# Patient Record
Sex: Female | Born: 1945 | Race: Black or African American | Hispanic: No | Marital: Married | Smoking: Never smoker
Health system: Southern US, Community
[De-identification: ages and names within clinical notes are randomized; demographics above are authoritative.]

## PROBLEM LIST (undated history)

## (undated) DIAGNOSIS — C50919 Malignant neoplasm of unspecified site of unspecified female breast: Secondary | ICD-10-CM

## (undated) DIAGNOSIS — I10 Essential (primary) hypertension: Secondary | ICD-10-CM

## (undated) DIAGNOSIS — M1990 Unspecified osteoarthritis, unspecified site: Secondary | ICD-10-CM

## (undated) DIAGNOSIS — J811 Chronic pulmonary edema: Secondary | ICD-10-CM

## (undated) DIAGNOSIS — I5189 Other ill-defined heart diseases: Secondary | ICD-10-CM

## (undated) DIAGNOSIS — C801 Malignant (primary) neoplasm, unspecified: Secondary | ICD-10-CM

## (undated) DIAGNOSIS — E7800 Pure hypercholesterolemia, unspecified: Secondary | ICD-10-CM

## (undated) DIAGNOSIS — B029 Zoster without complications: Secondary | ICD-10-CM

## (undated) HISTORY — DX: Malignant neoplasm of unspecified site of unspecified female breast: C50.919

## (undated) HISTORY — PX: CHOLECYSTECTOMY: SHX55

## (undated) HISTORY — PX: BREAST SURGERY: SHX581

## (undated) HISTORY — PX: ABDOMINAL HYSTERECTOMY: SHX81

---

## 1999-08-15 ENCOUNTER — Other Ambulatory Visit: Admission: RE | Admit: 1999-08-15 | Discharge: 1999-08-15 | Payer: Self-pay | Admitting: Obstetrics and Gynecology

## 2000-10-31 ENCOUNTER — Other Ambulatory Visit: Admission: RE | Admit: 2000-10-31 | Discharge: 2000-10-31 | Payer: Self-pay | Admitting: Obstetrics and Gynecology

## 2001-08-21 ENCOUNTER — Ambulatory Visit (HOSPITAL_COMMUNITY): Admission: RE | Admit: 2001-08-21 | Discharge: 2001-08-21 | Payer: Self-pay | Admitting: Internal Medicine

## 2002-03-15 ENCOUNTER — Encounter: Payer: Self-pay | Admitting: Internal Medicine

## 2002-03-15 ENCOUNTER — Emergency Department (HOSPITAL_COMMUNITY): Admission: EM | Admit: 2002-03-15 | Discharge: 2002-03-15 | Payer: Self-pay | Admitting: Internal Medicine

## 2003-09-22 ENCOUNTER — Emergency Department (HOSPITAL_COMMUNITY): Admission: EM | Admit: 2003-09-22 | Discharge: 2003-09-22 | Payer: Self-pay | Admitting: Emergency Medicine

## 2003-11-16 ENCOUNTER — Ambulatory Visit (HOSPITAL_COMMUNITY): Admission: RE | Admit: 2003-11-16 | Discharge: 2003-11-16 | Payer: Self-pay | Admitting: Internal Medicine

## 2004-10-06 ENCOUNTER — Ambulatory Visit (HOSPITAL_COMMUNITY): Admission: RE | Admit: 2004-10-06 | Discharge: 2004-10-06 | Payer: Self-pay | Admitting: Internal Medicine

## 2005-08-31 ENCOUNTER — Ambulatory Visit (HOSPITAL_COMMUNITY): Admission: RE | Admit: 2005-08-31 | Discharge: 2005-08-31 | Payer: Self-pay | Admitting: Internal Medicine

## 2005-12-04 ENCOUNTER — Ambulatory Visit (HOSPITAL_COMMUNITY): Admission: RE | Admit: 2005-12-04 | Discharge: 2005-12-04 | Payer: Self-pay | Admitting: Internal Medicine

## 2006-01-24 ENCOUNTER — Encounter: Admission: RE | Admit: 2006-01-24 | Discharge: 2006-01-24 | Payer: Self-pay | Admitting: Radiology

## 2006-03-07 ENCOUNTER — Encounter: Admission: RE | Admit: 2006-03-07 | Discharge: 2006-03-07 | Payer: Self-pay | Admitting: General Surgery

## 2006-03-11 ENCOUNTER — Ambulatory Visit (HOSPITAL_BASED_OUTPATIENT_CLINIC_OR_DEPARTMENT_OTHER): Admission: RE | Admit: 2006-03-11 | Discharge: 2006-03-11 | Payer: Self-pay | Admitting: General Surgery

## 2006-03-11 ENCOUNTER — Encounter (INDEPENDENT_AMBULATORY_CARE_PROVIDER_SITE_OTHER): Payer: Self-pay | Admitting: *Deleted

## 2006-03-27 ENCOUNTER — Ambulatory Visit: Payer: Self-pay | Admitting: Oncology

## 2006-04-03 LAB — CBC WITH DIFFERENTIAL/PLATELET
Basophils Absolute: 0 10*3/uL (ref 0.0–0.1)
Eosinophils Absolute: 0.4 10*3/uL (ref 0.0–0.5)
HCT: 37.3 % (ref 34.8–46.6)
HGB: 12.6 g/dL (ref 11.6–15.9)
LYMPH%: 22 % (ref 14.0–48.0)
MONO#: 0.6 10*3/uL (ref 0.1–0.9)
NEUT#: 6.2 10*3/uL (ref 1.5–6.5)
Platelets: 352 10*3/uL (ref 145–400)
RBC: 4.34 10*6/uL (ref 3.70–5.32)
WBC: 9.2 10*3/uL (ref 3.9–10.0)

## 2006-04-03 LAB — COMPREHENSIVE METABOLIC PANEL
Albumin: 4.2 g/dL (ref 3.5–5.2)
CO2: 24 mEq/L (ref 19–32)
Glucose, Bld: 244 mg/dL — ABNORMAL HIGH (ref 70–99)
Potassium: 4 mEq/L (ref 3.5–5.3)
Sodium: 139 mEq/L (ref 135–145)
Total Bilirubin: 0.4 mg/dL (ref 0.3–1.2)
Total Protein: 7.9 g/dL (ref 6.0–8.3)

## 2006-04-03 LAB — LACTATE DEHYDROGENASE: LDH: 162 U/L (ref 94–250)

## 2006-04-03 LAB — CANCER ANTIGEN 27.29: CA 27.29: 28 U/mL (ref 0–39)

## 2006-04-18 ENCOUNTER — Ambulatory Visit (HOSPITAL_COMMUNITY): Admission: RE | Admit: 2006-04-18 | Discharge: 2006-04-18 | Payer: Self-pay | Admitting: Oncology

## 2006-04-22 LAB — CBC WITH DIFFERENTIAL/PLATELET
Basophils Absolute: 0 10*3/uL (ref 0.0–0.1)
Eosinophils Absolute: 0.3 10*3/uL (ref 0.0–0.5)
HCT: 38 % (ref 34.8–46.6)
HGB: 12.6 g/dL (ref 11.6–15.9)
LYMPH%: 24.1 % (ref 14.0–48.0)
MCV: 86.9 fL (ref 81.0–101.0)
MONO%: 7.4 % (ref 0.0–13.0)
NEUT#: 6.4 10*3/uL (ref 1.5–6.5)
NEUT%: 65.5 % (ref 39.6–76.8)
Platelets: 292 10*3/uL (ref 145–400)

## 2006-04-26 LAB — WHOLE BLOOD GLUCOSE: Glucose: 181 mg/dL — ABNORMAL HIGH (ref 70–100)

## 2006-04-26 LAB — CBC WITH DIFFERENTIAL/PLATELET
Eosinophils Absolute: 0 10*3/uL (ref 0.0–0.5)
LYMPH%: 9.7 % — ABNORMAL LOW (ref 14.0–48.0)
MCH: 28.4 pg (ref 26.0–34.0)
MCHC: 33.3 g/dL (ref 32.0–36.0)
MCV: 85.2 fL (ref 81.0–101.0)
MONO%: 6.7 % (ref 0.0–13.0)
NEUT#: 14.4 10*3/uL — ABNORMAL HIGH (ref 1.5–6.5)
Platelets: 352 10*3/uL (ref 145–400)
RBC: 4.5 10*6/uL (ref 3.70–5.32)

## 2006-04-29 ENCOUNTER — Ambulatory Visit (HOSPITAL_COMMUNITY): Admission: RE | Admit: 2006-04-29 | Discharge: 2006-04-29 | Payer: Self-pay | Admitting: Oncology

## 2006-05-02 LAB — CBC WITH DIFFERENTIAL/PLATELET
Basophils Absolute: 0.5 10*3/uL — ABNORMAL HIGH (ref 0.0–0.1)
EOS%: 0.6 % (ref 0.0–7.0)
HGB: 13.3 g/dL (ref 11.6–15.9)
MCH: 28.9 pg (ref 26.0–34.0)
MCV: 85.9 fL (ref 81.0–101.0)
MONO%: 8.6 % (ref 0.0–13.0)
NEUT%: 72.5 % (ref 39.6–76.8)
RDW: 12.6 % (ref 11.3–14.5)

## 2006-05-14 ENCOUNTER — Encounter: Admission: RE | Admit: 2006-05-14 | Discharge: 2006-05-14 | Payer: Self-pay | Admitting: Oncology

## 2006-05-14 ENCOUNTER — Ambulatory Visit: Payer: Self-pay | Admitting: Oncology

## 2006-05-14 ENCOUNTER — Encounter (INDEPENDENT_AMBULATORY_CARE_PROVIDER_SITE_OTHER): Payer: Self-pay | Admitting: *Deleted

## 2006-05-14 ENCOUNTER — Other Ambulatory Visit: Admission: RE | Admit: 2006-05-14 | Discharge: 2006-05-14 | Payer: Self-pay | Admitting: Interventional Radiology

## 2006-05-16 LAB — CBC WITH DIFFERENTIAL/PLATELET
Eosinophils Absolute: 0 10*3/uL (ref 0.0–0.5)
MCV: 86.1 fL (ref 81.0–101.0)
MONO%: 2.6 % (ref 0.0–13.0)
NEUT#: 19 10*3/uL — ABNORMAL HIGH (ref 1.5–6.5)
RBC: 4.12 10*6/uL (ref 3.70–5.32)
RDW: 13.3 % (ref 11.3–14.5)
WBC: 20.7 10*3/uL — ABNORMAL HIGH (ref 3.9–10.0)

## 2006-05-16 LAB — COMPREHENSIVE METABOLIC PANEL
Albumin: 4.2 g/dL (ref 3.5–5.2)
CO2: 18 mEq/L — ABNORMAL LOW (ref 19–32)
Chloride: 104 mEq/L (ref 96–112)
Glucose, Bld: 354 mg/dL — ABNORMAL HIGH (ref 70–99)
Potassium: 3.8 mEq/L (ref 3.5–5.3)
Sodium: 136 mEq/L (ref 135–145)
Total Protein: 7.3 g/dL (ref 6.0–8.3)

## 2006-05-23 LAB — CBC WITH DIFFERENTIAL/PLATELET
Eosinophils Absolute: 0 10*3/uL (ref 0.0–0.5)
LYMPH%: 38.9 % (ref 14.0–48.0)
MONO#: 0.2 10*3/uL (ref 0.1–0.9)
NEUT#: 1.8 10*3/uL (ref 1.5–6.5)
Platelets: 383 10*3/uL (ref 145–400)
RBC: 3.69 10*6/uL — ABNORMAL LOW (ref 3.70–5.32)
RDW: 15.2 % — ABNORMAL HIGH (ref 11.3–14.5)
WBC: 3.4 10*3/uL — ABNORMAL LOW (ref 3.9–10.0)
lymph#: 1.3 10*3/uL (ref 0.9–3.3)

## 2006-05-23 LAB — COMPREHENSIVE METABOLIC PANEL
ALT: 40 U/L (ref 0–40)
Albumin: 4 g/dL (ref 3.5–5.2)
CO2: 24 mEq/L (ref 19–32)
Calcium: 9.5 mg/dL (ref 8.4–10.5)
Chloride: 104 mEq/L (ref 96–112)
Glucose, Bld: 230 mg/dL — ABNORMAL HIGH (ref 70–99)
Potassium: 3.9 mEq/L (ref 3.5–5.3)
Sodium: 138 mEq/L (ref 135–145)
Total Protein: 6.4 g/dL (ref 6.0–8.3)

## 2006-06-06 LAB — CBC WITH DIFFERENTIAL/PLATELET
BASO%: 0.2 % (ref 0.0–2.0)
LYMPH%: 6.2 % — ABNORMAL LOW (ref 14.0–48.0)
MCHC: 34.2 g/dL (ref 32.0–36.0)
MONO#: 1.1 10*3/uL — ABNORMAL HIGH (ref 0.1–0.9)
NEUT#: 23.6 10*3/uL — ABNORMAL HIGH (ref 1.5–6.5)
Platelets: 358 10*3/uL (ref 145–400)
RBC: 4.13 10*6/uL (ref 3.70–5.32)
RDW: 13.7 % (ref 11.3–14.5)
WBC: 26.4 10*3/uL — ABNORMAL HIGH (ref 3.9–10.0)
lymph#: 1.6 10*3/uL (ref 0.9–3.3)

## 2006-06-13 LAB — CBC WITH DIFFERENTIAL/PLATELET
BASO%: 0.4 % (ref 0.0–2.0)
LYMPH%: 37.7 % (ref 14.0–48.0)
MCHC: 33.9 g/dL (ref 32.0–36.0)
MCV: 84.5 fL (ref 81.0–101.0)
MONO#: 0.2 10*3/uL (ref 0.1–0.9)
MONO%: 6.9 % (ref 0.0–13.0)
Platelets: 345 10*3/uL (ref 145–400)
RBC: 3.95 10*6/uL (ref 3.70–5.32)
WBC: 3.1 10*3/uL — ABNORMAL LOW (ref 3.9–10.0)

## 2006-06-13 LAB — COMPREHENSIVE METABOLIC PANEL
ALT: 27 U/L (ref 0–40)
Albumin: 3.4 g/dL — ABNORMAL LOW (ref 3.5–5.2)
CO2: 26 mEq/L (ref 19–32)
Potassium: 3.3 mEq/L — ABNORMAL LOW (ref 3.5–5.3)
Sodium: 139 mEq/L (ref 135–145)
Total Bilirubin: 0.4 mg/dL (ref 0.3–1.2)
Total Protein: 6.6 g/dL (ref 6.0–8.3)

## 2006-06-25 ENCOUNTER — Ambulatory Visit: Payer: Self-pay | Admitting: Oncology

## 2006-06-27 LAB — COMPREHENSIVE METABOLIC PANEL
ALT: 19 U/L (ref 0–40)
AST: 13 U/L (ref 0–37)
Alkaline Phosphatase: 103 U/L (ref 39–117)
Sodium: 134 mEq/L — ABNORMAL LOW (ref 135–145)
Total Bilirubin: 0.4 mg/dL (ref 0.3–1.2)
Total Protein: 7.2 g/dL (ref 6.0–8.3)

## 2006-06-27 LAB — CBC WITH DIFFERENTIAL/PLATELET
BASO%: 0.4 % (ref 0.0–2.0)
Eosinophils Absolute: 0 10*3/uL (ref 0.0–0.5)
LYMPH%: 4.9 % — ABNORMAL LOW (ref 14.0–48.0)
MCHC: 33.7 g/dL (ref 32.0–36.0)
MCV: 83.8 fL (ref 81.0–101.0)
MONO%: 1.8 % (ref 0.0–13.0)
Platelets: 405 10*3/uL — ABNORMAL HIGH (ref 145–400)
RBC: 4.45 10*6/uL (ref 3.70–5.32)

## 2006-07-04 LAB — CBC WITH DIFFERENTIAL/PLATELET
Basophils Absolute: 0 10*3/uL (ref 0.0–0.1)
Eosinophils Absolute: 0 10*3/uL (ref 0.0–0.5)
HCT: 33.6 % — ABNORMAL LOW (ref 34.8–46.6)
HGB: 11.4 g/dL — ABNORMAL LOW (ref 11.6–15.9)
NEUT#: 2.3 10*3/uL (ref 1.5–6.5)
NEUT%: 57.7 % (ref 39.6–76.8)
RDW: 14 % (ref 11.3–14.5)
lymph#: 1.3 10*3/uL (ref 0.9–3.3)

## 2006-07-05 ENCOUNTER — Ambulatory Visit: Admission: RE | Admit: 2006-07-05 | Discharge: 2006-10-03 | Payer: Self-pay | Admitting: Radiation Oncology

## 2006-08-19 ENCOUNTER — Ambulatory Visit: Payer: Self-pay | Admitting: Oncology

## 2006-08-21 LAB — CBC WITH DIFFERENTIAL/PLATELET
Basophils Absolute: 0 10*3/uL (ref 0.0–0.1)
EOS%: 5.9 % (ref 0.0–7.0)
HCT: 34 % — ABNORMAL LOW (ref 34.8–46.6)
HGB: 11.4 g/dL — ABNORMAL LOW (ref 11.6–15.9)
MCH: 27.9 pg (ref 26.0–34.0)
MCHC: 33.5 g/dL (ref 32.0–36.0)
MCV: 83.4 fL (ref 81.0–101.0)
MONO%: 8.4 % (ref 0.0–13.0)
NEUT%: 67.9 % (ref 39.6–76.8)
RDW: 17.1 % — ABNORMAL HIGH (ref 11.3–14.5)

## 2006-08-21 LAB — COMPREHENSIVE METABOLIC PANEL
AST: 30 U/L (ref 0–37)
Alkaline Phosphatase: 76 U/L (ref 39–117)
BUN: 11 mg/dL (ref 6–23)
Creatinine, Ser: 0.82 mg/dL (ref 0.40–1.20)

## 2006-08-28 ENCOUNTER — Ambulatory Visit (HOSPITAL_COMMUNITY): Admission: RE | Admit: 2006-08-28 | Discharge: 2006-08-28 | Payer: Self-pay | Admitting: Oncology

## 2006-09-23 LAB — CBC WITH DIFFERENTIAL/PLATELET
BASO%: 0.1 % (ref 0.0–2.0)
Basophils Absolute: 0 10*3/uL (ref 0.0–0.1)
EOS%: 2.3 % (ref 0.0–7.0)
HGB: 12.2 g/dL (ref 11.6–15.9)
MCH: 27.7 pg (ref 26.0–34.0)
MCHC: 33.5 g/dL (ref 32.0–36.0)
MCV: 82.6 fL (ref 81.0–101.0)
MONO%: 6.5 % (ref 0.0–13.0)
RBC: 4.4 10*6/uL (ref 3.70–5.32)
RDW: 16.9 % — ABNORMAL HIGH (ref 11.3–14.5)
lymph#: 1.4 10*3/uL (ref 0.9–3.3)

## 2006-09-23 LAB — COMPREHENSIVE METABOLIC PANEL
ALT: 26 U/L (ref 0–35)
AST: 23 U/L (ref 0–37)
Albumin: 4.3 g/dL (ref 3.5–5.2)
Alkaline Phosphatase: 86 U/L (ref 39–117)
BUN: 14 mg/dL (ref 6–23)
Potassium: 4.1 mEq/L (ref 3.5–5.3)

## 2006-10-16 ENCOUNTER — Ambulatory Visit: Payer: Self-pay | Admitting: Oncology

## 2006-10-21 LAB — CBC WITH DIFFERENTIAL/PLATELET
BASO%: 0.4 % (ref 0.0–2.0)
EOS%: 1.8 % (ref 0.0–7.0)
MCH: 27.1 pg (ref 26.0–34.0)
MCHC: 33.5 g/dL (ref 32.0–36.0)
RDW: 14.5 % (ref 11.3–14.5)
lymph#: 1.7 10*3/uL (ref 0.9–3.3)

## 2006-10-21 LAB — LACTATE DEHYDROGENASE: LDH: 196 U/L (ref 94–250)

## 2006-10-21 LAB — COMPREHENSIVE METABOLIC PANEL
ALT: 34 U/L (ref 0–35)
AST: 20 U/L (ref 0–37)
Albumin: 4.6 g/dL (ref 3.5–5.2)
Calcium: 9.6 mg/dL (ref 8.4–10.5)
Chloride: 106 mEq/L (ref 96–112)
Potassium: 4.1 mEq/L (ref 3.5–5.3)

## 2007-01-15 ENCOUNTER — Ambulatory Visit: Payer: Self-pay | Admitting: Oncology

## 2007-01-20 LAB — CBC WITH DIFFERENTIAL/PLATELET
BASO%: 1.1 % (ref 0.0–2.0)
Eosinophils Absolute: 0.2 10*3/uL (ref 0.0–0.5)
MCHC: 35.2 g/dL (ref 32.0–36.0)
MONO#: 0.6 10*3/uL (ref 0.1–0.9)
MONO%: 6 % (ref 0.0–13.0)
NEUT#: 7.7 10*3/uL — ABNORMAL HIGH (ref 1.5–6.5)
RBC: 4.18 10*6/uL (ref 3.70–5.32)
RDW: 16.4 % — ABNORMAL HIGH (ref 11.3–14.5)
WBC: 10.4 10*3/uL — ABNORMAL HIGH (ref 3.9–10.0)

## 2007-01-20 LAB — COMPREHENSIVE METABOLIC PANEL
ALT: 23 U/L (ref 0–35)
Albumin: 4.2 g/dL (ref 3.5–5.2)
Alkaline Phosphatase: 87 U/L (ref 39–117)
Glucose, Bld: 96 mg/dL (ref 70–99)
Potassium: 3.7 mEq/L (ref 3.5–5.3)
Sodium: 141 mEq/L (ref 135–145)
Total Bilirubin: 0.2 mg/dL — ABNORMAL LOW (ref 0.3–1.2)
Total Protein: 7.1 g/dL (ref 6.0–8.3)

## 2007-01-20 LAB — CANCER ANTIGEN 27.29: CA 27.29: 26 U/mL (ref 0–39)

## 2007-02-16 ENCOUNTER — Emergency Department (HOSPITAL_COMMUNITY): Admission: EM | Admit: 2007-02-16 | Discharge: 2007-02-17 | Payer: Self-pay | Admitting: Emergency Medicine

## 2007-05-15 ENCOUNTER — Ambulatory Visit: Payer: Self-pay | Admitting: Oncology

## 2007-05-19 LAB — CBC WITH DIFFERENTIAL/PLATELET
Basophils Absolute: 0 10*3/uL (ref 0.0–0.1)
EOS%: 3.2 % (ref 0.0–7.0)
Eosinophils Absolute: 0.2 10*3/uL (ref 0.0–0.5)
HCT: 34.8 % (ref 34.8–46.6)
HGB: 11.9 g/dL (ref 11.6–15.9)
MCH: 28.6 pg (ref 26.0–34.0)
MCV: 83.6 fL (ref 81.0–101.0)
NEUT#: 5.3 10*3/uL (ref 1.5–6.5)
NEUT%: 72.5 % (ref 39.6–76.8)
lymph#: 1.3 10*3/uL (ref 0.9–3.3)

## 2007-05-19 LAB — COMPREHENSIVE METABOLIC PANEL
AST: 17 U/L (ref 0–37)
Albumin: 4.2 g/dL (ref 3.5–5.2)
BUN: 16 mg/dL (ref 6–23)
Calcium: 10.2 mg/dL (ref 8.4–10.5)
Chloride: 105 mEq/L (ref 96–112)
Creatinine, Ser: 0.9 mg/dL (ref 0.40–1.20)
Glucose, Bld: 137 mg/dL — ABNORMAL HIGH (ref 70–99)

## 2007-05-19 LAB — LACTATE DEHYDROGENASE: LDH: 143 U/L (ref 94–250)

## 2007-05-19 LAB — CANCER ANTIGEN 27.29: CA 27.29: 20 U/mL (ref 0–39)

## 2007-05-21 ENCOUNTER — Other Ambulatory Visit: Admission: RE | Admit: 2007-05-21 | Discharge: 2007-05-21 | Payer: Self-pay | Admitting: Radiology

## 2007-10-07 ENCOUNTER — Ambulatory Visit: Payer: Self-pay | Admitting: Oncology

## 2007-10-10 LAB — CBC WITH DIFFERENTIAL/PLATELET
BASO%: 0.3 % (ref 0.0–2.0)
Eosinophils Absolute: 0.3 10*3/uL (ref 0.0–0.5)
MCHC: 34.3 g/dL (ref 32.0–36.0)
MONO#: 0.6 10*3/uL (ref 0.1–0.9)
NEUT#: 8.6 10*3/uL — ABNORMAL HIGH (ref 1.5–6.5)
Platelets: 333 10*3/uL (ref 145–400)
RBC: 4.1 10*6/uL (ref 3.70–5.32)
RDW: 14.4 % (ref 11.3–14.5)
WBC: 10.9 10*3/uL — ABNORMAL HIGH (ref 3.9–10.0)
lymph#: 1.4 10*3/uL (ref 0.9–3.3)

## 2007-10-10 LAB — COMPREHENSIVE METABOLIC PANEL
ALT: 22 U/L (ref 0–35)
Alkaline Phosphatase: 102 U/L (ref 39–117)
CO2: 24 mEq/L (ref 19–32)
Potassium: 4.1 mEq/L (ref 3.5–5.3)
Sodium: 141 mEq/L (ref 135–145)
Total Bilirubin: 0.2 mg/dL — ABNORMAL LOW (ref 0.3–1.2)
Total Protein: 7.6 g/dL (ref 6.0–8.3)

## 2007-11-15 ENCOUNTER — Emergency Department (HOSPITAL_COMMUNITY): Admission: EM | Admit: 2007-11-15 | Discharge: 2007-11-15 | Payer: Self-pay | Admitting: Emergency Medicine

## 2007-11-15 ENCOUNTER — Encounter: Payer: Self-pay | Admitting: Orthopedic Surgery

## 2007-11-17 IMAGING — CR DG CHEST 2V
2 series · 2 of 2 positions shown · non-contrast
Comparison: 12/04/2005.

CLINICAL DATA: Right breast cancer. Preoperative evaluation.

CHEST - 2 VIEW

[w chest pa]
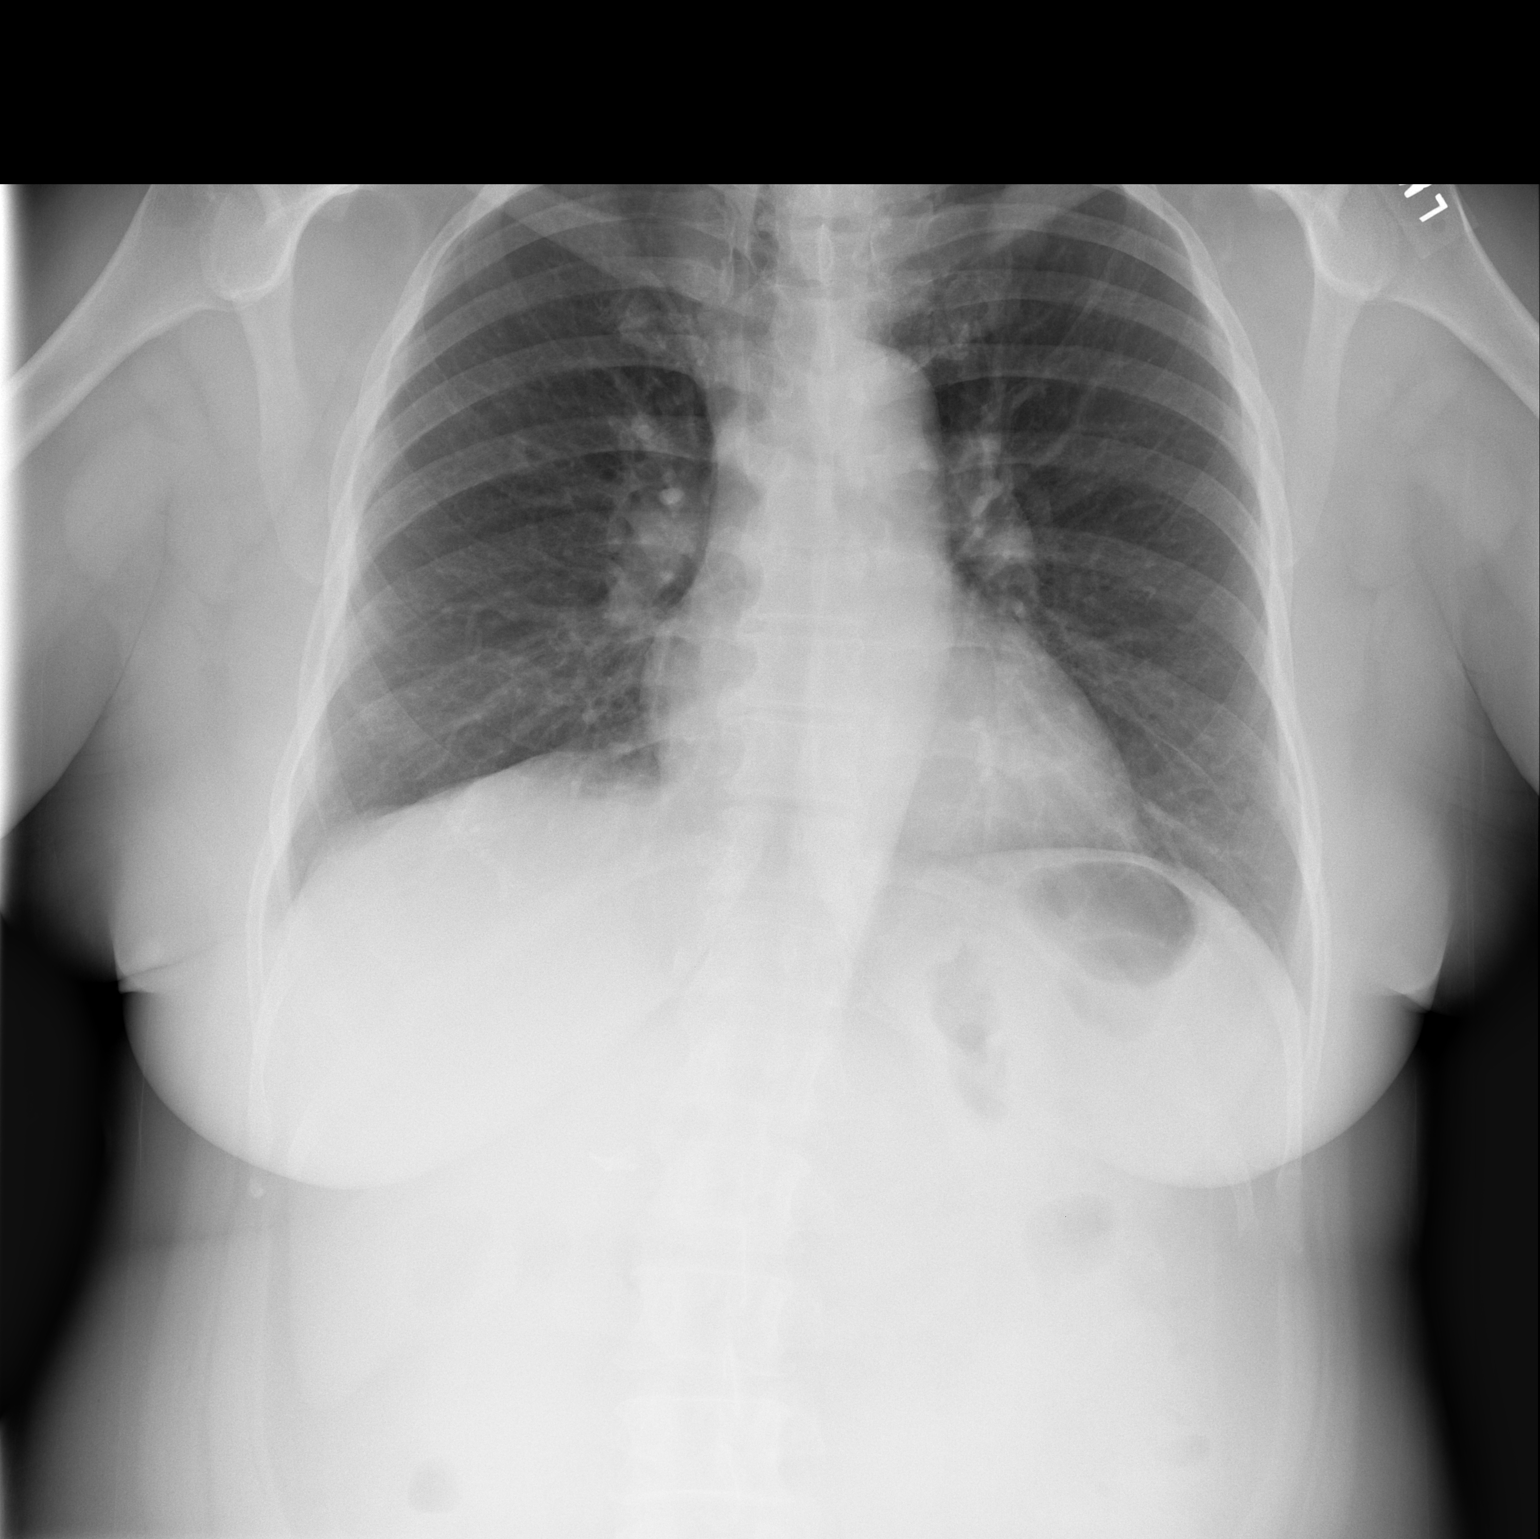

[w chest lat]
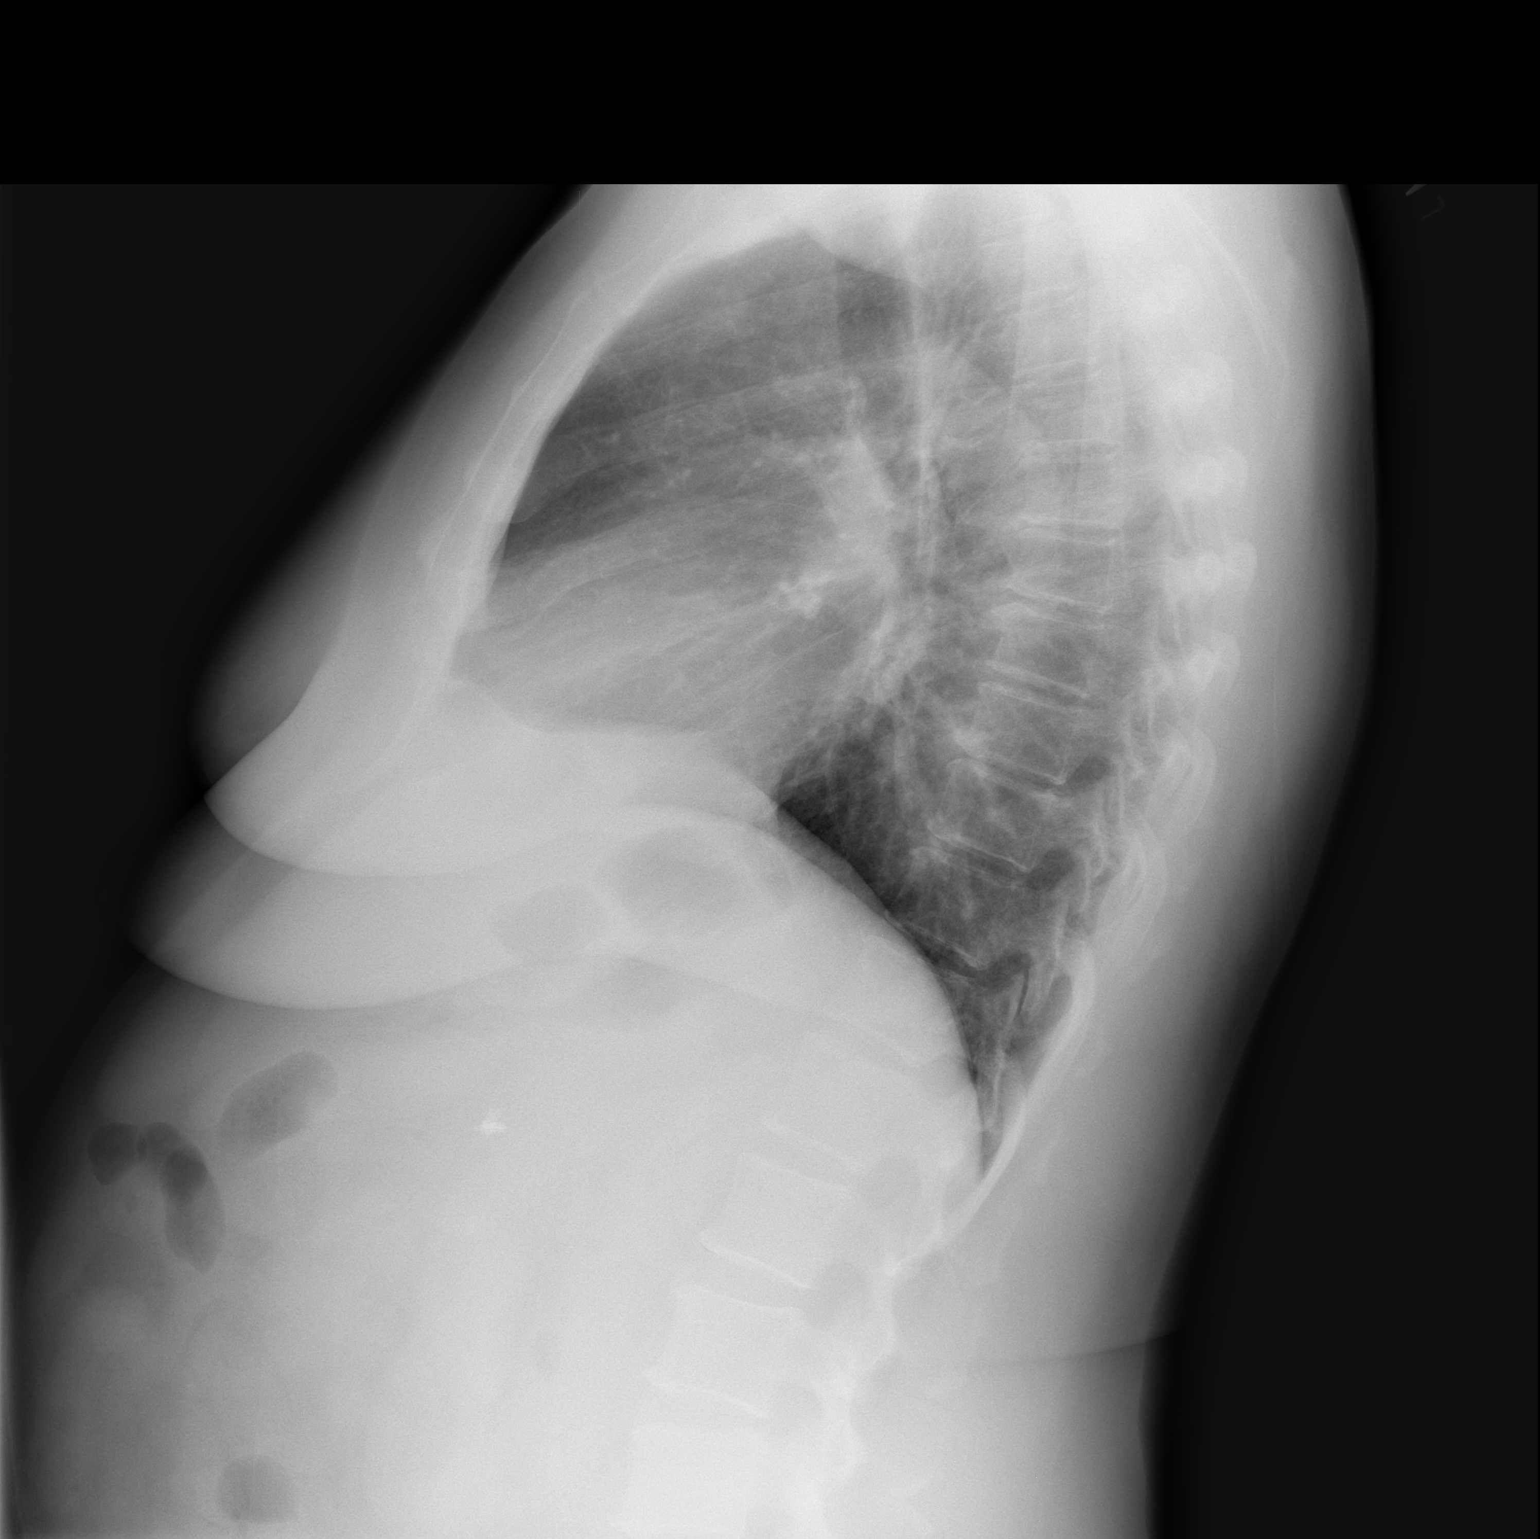

[2 of 2 positions shown; findings below may reference images not displayed]

FINDINGS: Stable normal sized heart, clear lungs, mild scoliosis, thoracic
spine degenerative changes and cholecystectomy clips.

IMPRESSION

No acute abnormality.

## 2007-11-19 ENCOUNTER — Ambulatory Visit: Payer: Self-pay | Admitting: Orthopedic Surgery

## 2007-11-19 DIAGNOSIS — M8448XA Pathological fracture, other site, initial encounter for fracture: Secondary | ICD-10-CM | POA: Insufficient documentation

## 2007-11-19 DIAGNOSIS — S93409A Sprain of unspecified ligament of unspecified ankle, initial encounter: Secondary | ICD-10-CM | POA: Insufficient documentation

## 2008-01-09 IMAGING — US US SOFT TISSUE HEAD/NECK
1 series · 14 of 25 positions shown · non-contrast
Comparison: none

CLINICAL DATA: Breast cancer, thyroid nodule.  
THYROID ULTRASOUND:
TECHNIQUE: Ultrasound examination of the thyroid gland and adjacent soft tissue structures was performed.

[Series 1: unknown · 0.11mm/px · 14 of 60 slices shown]
[im 1/60]
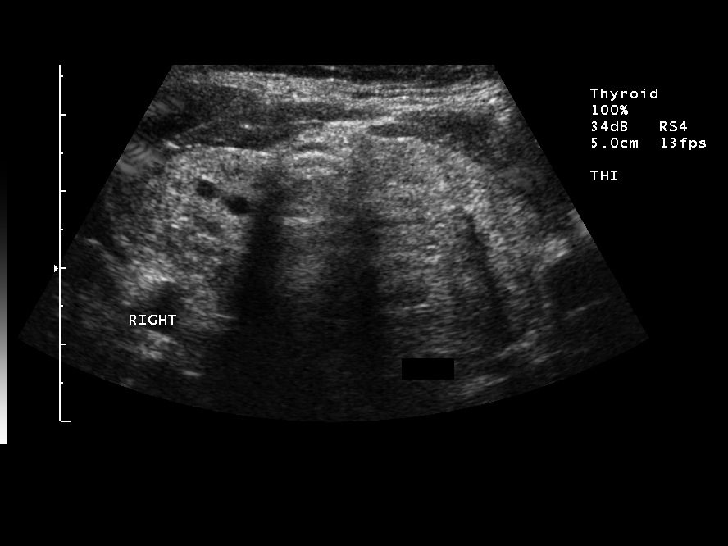
[im 5/60]
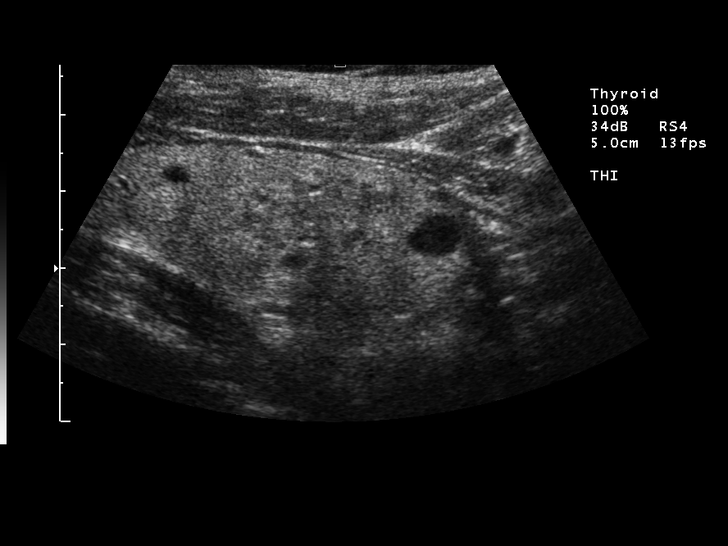
[im 10/60]
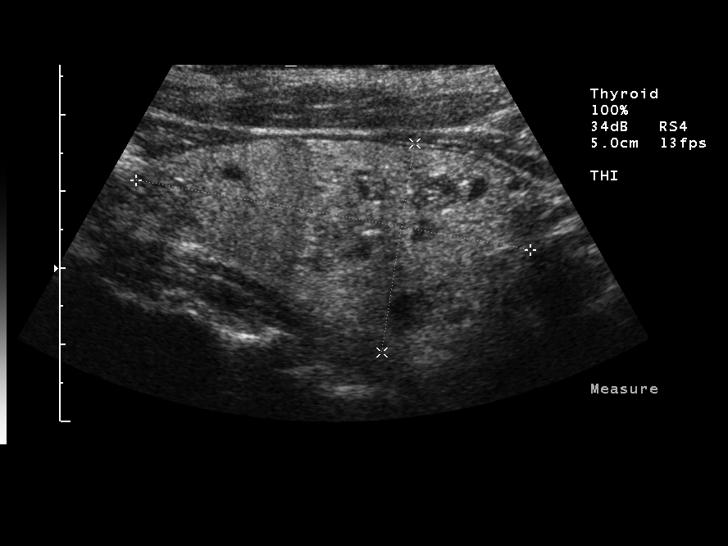
[im 15/60]
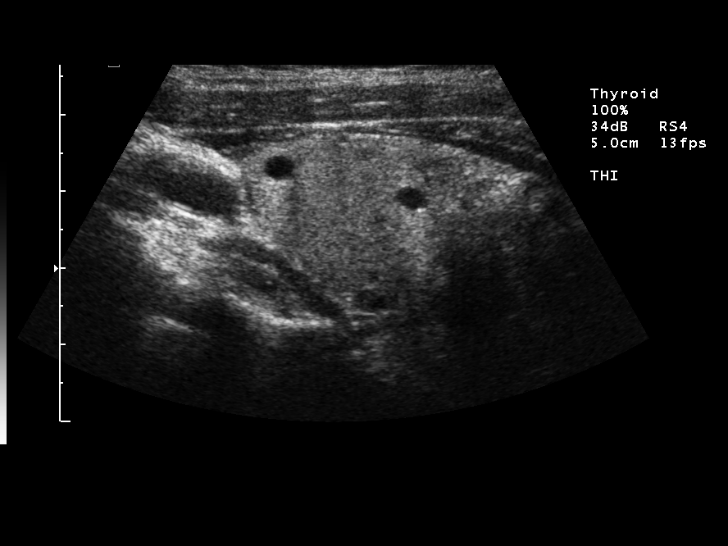
[im 20/60]
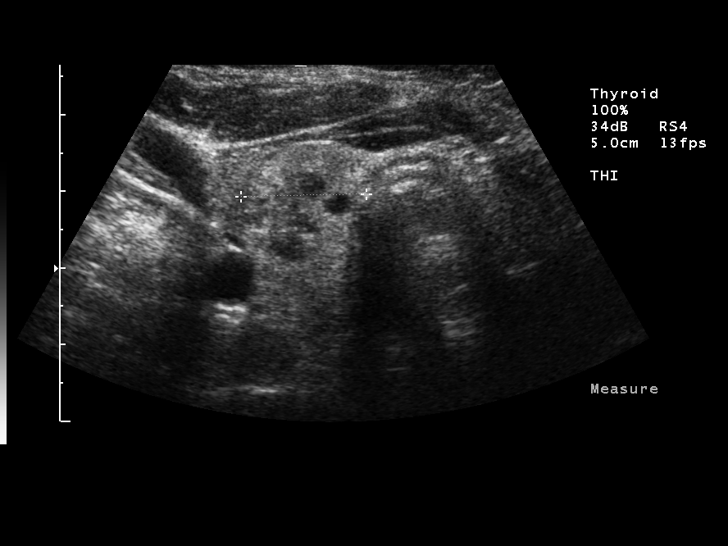
[im 23/60]
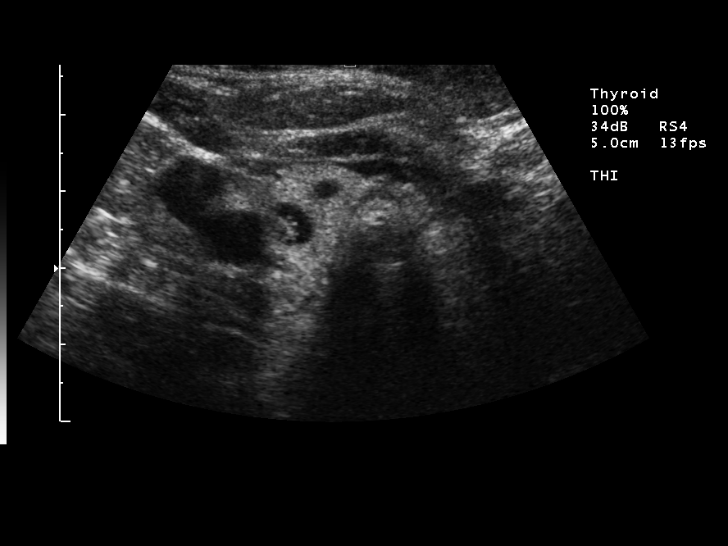
[im 28/60]
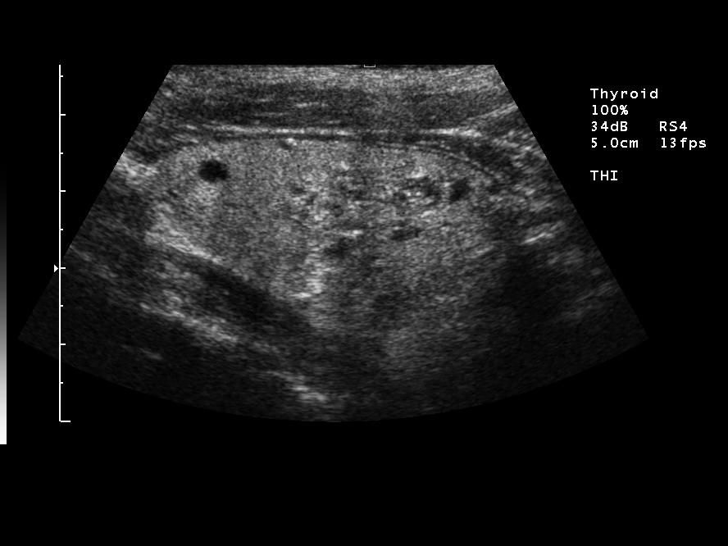
[im 32/60]
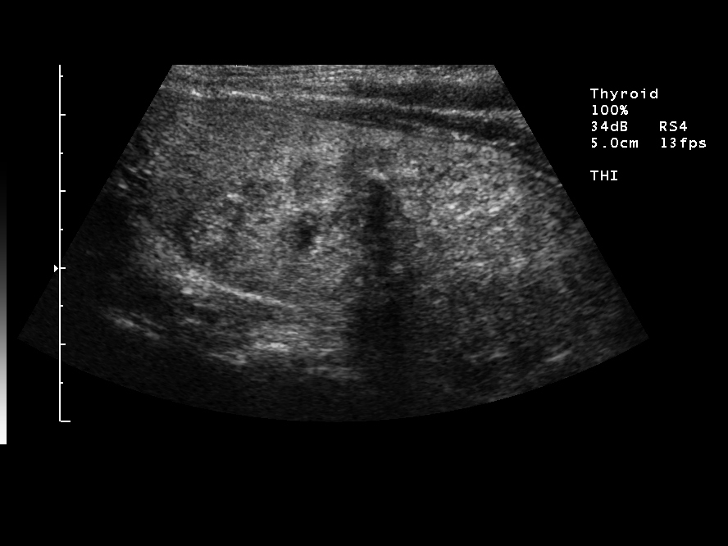
[im 37/60]
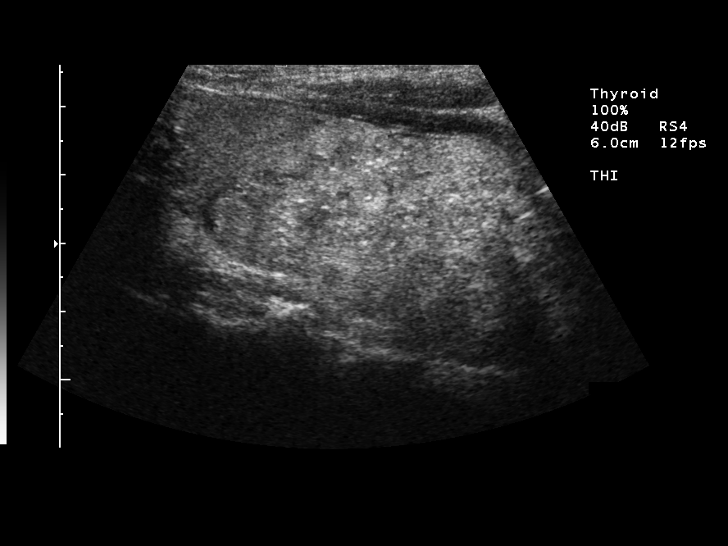
[im 40/60]
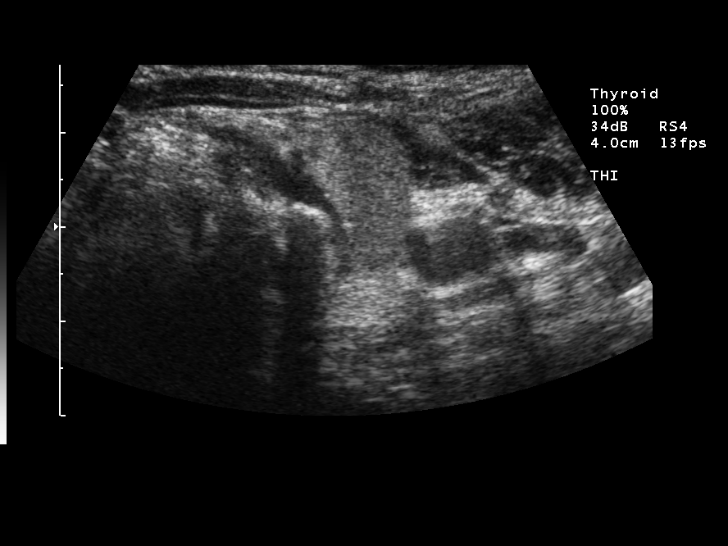
[im 45/60]
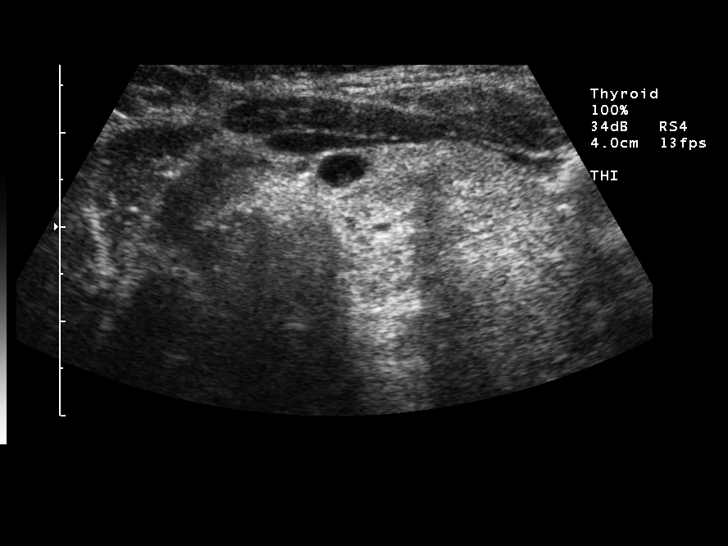
[im 50/60]
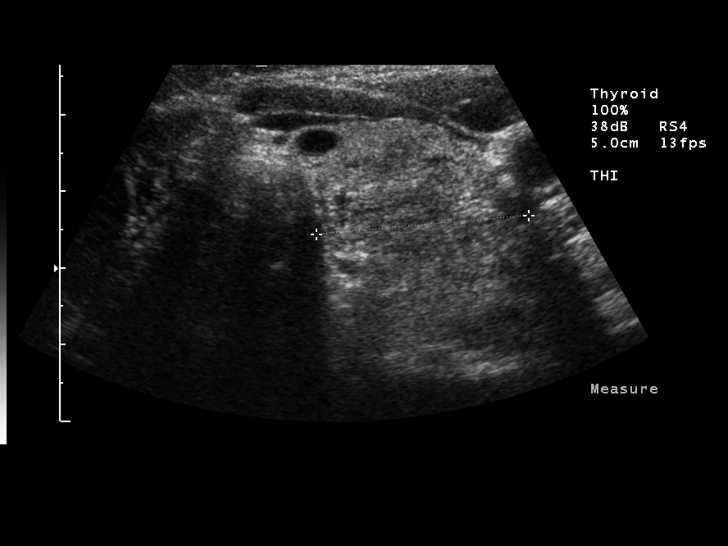
[im 55/60]
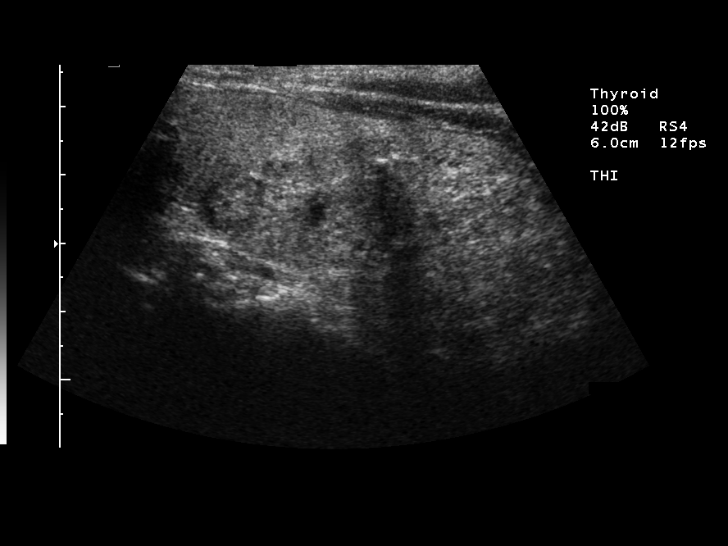
[im 60/60]
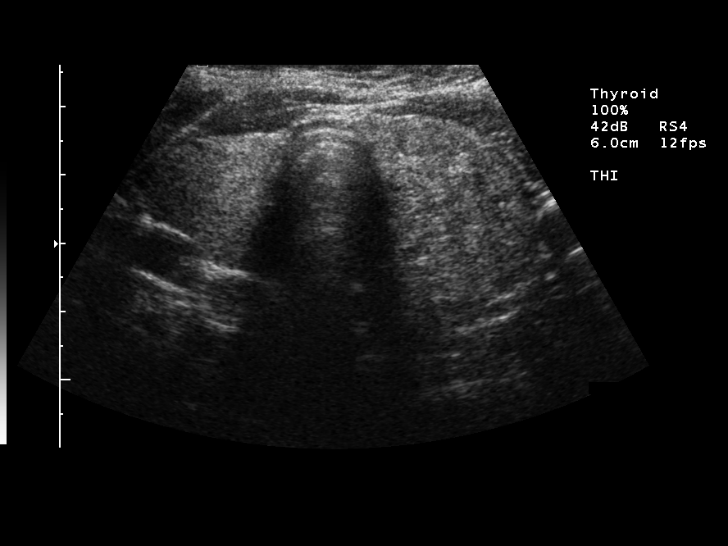

[14 of 25 positions shown; findings below may reference images not displayed]

FINDINGS: Right lobe of the thyroid measures 5.2 x 2.8 x 2.1 cm.   Left lobe measures 6.9 x 3.3 x 2.8 cm.  There are numerous nodules in both lobes.  In the upper pole on the right is a cystic nodule measuring 4 mm.  In the mid-pole on the right is a 2.2 x 1.6 x 1.6 solid nodule with some areas of cystic degeneration.  An 8 mm cyst is noted in the lower pole on the right.  
On the left, there are several cystic areas measuring 5 mm in the upper pole.  A large 5.3 x 3.1 x 2.4 cm solid nodule with apparent calcification is noted occupying the mid and lower poles on the left.
IMPRESSION: 1.  Multiple cystic and solid nodules in both right and left lobes.  Largest on the right represents a solid nodule measuring 2.2 cm and on the left a 5.3 cm nodule with calcification.
2.  Multiple biopsies would be suggested of the large nodule on the left as well as biopsies of the 2.2 cm nodule on the right.

## 2008-04-12 ENCOUNTER — Ambulatory Visit: Payer: Self-pay | Admitting: Oncology

## 2008-04-15 LAB — CBC WITH DIFFERENTIAL/PLATELET
Basophils Absolute: 0 10*3/uL (ref 0.0–0.1)
Eosinophils Absolute: 0.2 10*3/uL (ref 0.0–0.5)
HGB: 11.7 g/dL (ref 11.6–15.9)
MCV: 82.2 fL (ref 81.0–101.0)
MONO#: 0.5 10*3/uL (ref 0.1–0.9)
NEUT#: 6.8 10*3/uL — ABNORMAL HIGH (ref 1.5–6.5)
Platelets: 363 10*3/uL (ref 145–400)
RBC: 4.21 10*6/uL (ref 3.70–5.32)
RDW: 14.9 % — ABNORMAL HIGH (ref 11.3–14.5)
WBC: 8.8 10*3/uL (ref 3.9–10.0)

## 2008-04-16 LAB — COMPREHENSIVE METABOLIC PANEL
Albumin: 4.1 g/dL (ref 3.5–5.2)
BUN: 16 mg/dL (ref 6–23)
CO2: 25 mEq/L (ref 19–32)
Calcium: 9.9 mg/dL (ref 8.4–10.5)
Glucose, Bld: 126 mg/dL — ABNORMAL HIGH (ref 70–99)
Potassium: 4.5 mEq/L (ref 3.5–5.3)
Sodium: 140 mEq/L (ref 135–145)
Total Protein: 7.4 g/dL (ref 6.0–8.3)

## 2008-04-16 LAB — LACTATE DEHYDROGENASE: LDH: 151 U/L (ref 94–250)

## 2008-04-16 LAB — CANCER ANTIGEN 27.29: CA 27.29: 34 U/mL (ref 0–39)

## 2008-04-16 LAB — VITAMIN D 25 HYDROXY (VIT D DEFICIENCY, FRACTURES): Vit D, 25-Hydroxy: 39 ng/mL (ref 30–89)

## 2008-07-02 ENCOUNTER — Ambulatory Visit (HOSPITAL_COMMUNITY): Admission: RE | Admit: 2008-07-02 | Discharge: 2008-07-02 | Payer: Self-pay | Admitting: Oncology

## 2008-11-05 ENCOUNTER — Ambulatory Visit: Payer: Self-pay | Admitting: Oncology

## 2008-11-09 LAB — CBC WITH DIFFERENTIAL/PLATELET
BASO%: 0.2 % (ref 0.0–2.0)
HCT: 35.3 % (ref 34.8–46.6)
LYMPH%: 15.8 % (ref 14.0–48.0)
MCH: 28.1 pg (ref 26.0–34.0)
MCHC: 33.2 g/dL (ref 32.0–36.0)
MCV: 84.9 fL (ref 81.0–101.0)
MONO%: 6.6 % (ref 0.0–13.0)
NEUT%: 75.8 % (ref 39.6–76.8)
Platelets: 315 10*3/uL (ref 145–400)
RBC: 4.15 10*6/uL (ref 3.70–5.32)

## 2008-11-09 LAB — COMPREHENSIVE METABOLIC PANEL
ALT: 28 U/L (ref 0–35)
Alkaline Phosphatase: 95 U/L (ref 39–117)
CO2: 26 mEq/L (ref 19–32)
Creatinine, Ser: 0.87 mg/dL (ref 0.40–1.20)
Sodium: 137 mEq/L (ref 135–145)
Total Bilirubin: 0.4 mg/dL (ref 0.3–1.2)
Total Protein: 7.2 g/dL (ref 6.0–8.3)

## 2008-11-09 LAB — LACTATE DEHYDROGENASE: LDH: 166 U/L (ref 94–250)

## 2008-11-10 LAB — CANCER ANTIGEN 27.29: CA 27.29: 36 U/mL (ref 0–39)

## 2009-05-06 ENCOUNTER — Ambulatory Visit: Payer: Self-pay | Admitting: Oncology

## 2009-05-10 LAB — COMPREHENSIVE METABOLIC PANEL
ALT: 24 U/L (ref 0–35)
AST: 22 U/L (ref 0–37)
Albumin: 3.6 g/dL (ref 3.5–5.2)
CO2: 27 mEq/L (ref 19–32)
Calcium: 9.8 mg/dL (ref 8.4–10.5)
Chloride: 106 mEq/L (ref 96–112)
Creatinine, Ser: 0.93 mg/dL (ref 0.40–1.20)
Potassium: 3.7 mEq/L (ref 3.5–5.3)
Total Protein: 7.5 g/dL (ref 6.0–8.3)

## 2009-05-10 LAB — CBC WITH DIFFERENTIAL/PLATELET
BASO%: 0 % (ref 0.0–2.0)
HCT: 34.8 % (ref 34.8–46.6)
HGB: 11.7 g/dL (ref 11.6–15.9)
MCHC: 33.6 g/dL (ref 31.5–36.0)
MONO#: 0.6 10*3/uL (ref 0.1–0.9)
NEUT%: 73.6 % (ref 38.4–76.8)
RDW: 15.4 % — ABNORMAL HIGH (ref 11.2–14.5)
WBC: 8.8 10*3/uL (ref 3.9–10.3)
lymph#: 1.4 10*3/uL (ref 0.9–3.3)

## 2009-05-10 LAB — LACTATE DEHYDROGENASE: LDH: 135 U/L (ref 94–250)

## 2009-07-26 ENCOUNTER — Ambulatory Visit (HOSPITAL_COMMUNITY): Admission: RE | Admit: 2009-07-26 | Discharge: 2009-07-26 | Payer: Self-pay | Admitting: Internal Medicine

## 2009-08-24 ENCOUNTER — Encounter (HOSPITAL_COMMUNITY): Admission: RE | Admit: 2009-08-24 | Discharge: 2009-09-23 | Payer: Self-pay | Admitting: Internal Medicine

## 2009-11-21 ENCOUNTER — Ambulatory Visit: Payer: Self-pay | Admitting: Oncology

## 2009-11-24 LAB — COMPREHENSIVE METABOLIC PANEL
Albumin: 3.9 g/dL (ref 3.5–5.2)
Alkaline Phosphatase: 81 U/L (ref 39–117)
BUN: 16 mg/dL (ref 6–23)
CO2: 27 mEq/L (ref 19–32)
Calcium: 9.8 mg/dL (ref 8.4–10.5)
Chloride: 106 mEq/L (ref 96–112)
Glucose, Bld: 110 mg/dL — ABNORMAL HIGH (ref 70–99)
Potassium: 3.5 mEq/L (ref 3.5–5.3)
Sodium: 140 mEq/L (ref 135–145)
Total Protein: 7.8 g/dL (ref 6.0–8.3)

## 2009-11-24 LAB — CBC WITH DIFFERENTIAL/PLATELET
Basophils Absolute: 0 10*3/uL (ref 0.0–0.1)
Eosinophils Absolute: 0.2 10*3/uL (ref 0.0–0.5)
HGB: 11.8 g/dL (ref 11.6–15.9)
MONO#: 0.8 10*3/uL (ref 0.1–0.9)
MONO%: 7.5 % (ref 0.0–14.0)
NEUT#: 7.8 10*3/uL — ABNORMAL HIGH (ref 1.5–6.5)
RBC: 4.05 10*6/uL (ref 3.70–5.45)
RDW: 14.8 % — ABNORMAL HIGH (ref 11.2–14.5)
WBC: 10.5 10*3/uL — ABNORMAL HIGH (ref 3.9–10.3)
lymph#: 1.7 10*3/uL (ref 0.9–3.3)

## 2009-11-24 LAB — CANCER ANTIGEN 27.29: CA 27.29: 23 U/mL (ref 0–39)

## 2009-11-24 LAB — VITAMIN D 25 HYDROXY (VIT D DEFICIENCY, FRACTURES): Vit D, 25-Hydroxy: 54 ng/mL (ref 30–89)

## 2010-05-23 ENCOUNTER — Ambulatory Visit: Payer: Self-pay | Admitting: Oncology

## 2010-05-25 LAB — CBC WITH DIFFERENTIAL/PLATELET
BASO%: 0.3 % (ref 0.0–2.0)
Basophils Absolute: 0 10*3/uL (ref 0.0–0.1)
EOS%: 1.5 % (ref 0.0–7.0)
Eosinophils Absolute: 0.2 10*3/uL (ref 0.0–0.5)
HCT: 35.6 % (ref 34.8–46.6)
HGB: 12 g/dL (ref 11.6–15.9)
LYMPH%: 14.6 % (ref 14.0–49.7)
MCH: 28.6 pg (ref 25.1–34.0)
MCHC: 33.7 g/dL (ref 31.5–36.0)
MCV: 85 fL (ref 79.5–101.0)
MONO#: 0.8 10*3/uL (ref 0.1–0.9)
MONO%: 6.4 % (ref 0.0–14.0)
NEUT#: 9.8 10*3/uL — ABNORMAL HIGH (ref 1.5–6.5)
NEUT%: 77.2 % — ABNORMAL HIGH (ref 38.4–76.8)
Platelets: 301 10*3/uL (ref 145–400)
RBC: 4.19 10*6/uL (ref 3.70–5.45)
RDW: 15.2 % — ABNORMAL HIGH (ref 11.2–14.5)
WBC: 12.6 10*3/uL — ABNORMAL HIGH (ref 3.9–10.3)
lymph#: 1.8 10*3/uL (ref 0.9–3.3)

## 2010-05-25 LAB — COMPREHENSIVE METABOLIC PANEL
ALT: 26 U/L (ref 0–35)
AST: 25 U/L (ref 0–37)
Albumin: 3.9 g/dL (ref 3.5–5.2)
Alkaline Phosphatase: 76 U/L (ref 39–117)
BUN: 11 mg/dL (ref 6–23)
CO2: 27 mEq/L (ref 19–32)
Calcium: 9.6 mg/dL (ref 8.4–10.5)
Chloride: 101 mEq/L (ref 96–112)
Creatinine, Ser: 0.79 mg/dL (ref 0.40–1.20)
Glucose, Bld: 144 mg/dL — ABNORMAL HIGH (ref 70–99)
Potassium: 4.3 mEq/L (ref 3.5–5.3)
Sodium: 137 mEq/L (ref 135–145)
Total Bilirubin: 0.5 mg/dL (ref 0.3–1.2)
Total Protein: 7.6 g/dL (ref 6.0–8.3)

## 2010-05-25 LAB — LACTATE DEHYDROGENASE: LDH: 155 U/L (ref 94–250)

## 2010-05-26 LAB — VITAMIN D 25 HYDROXY (VIT D DEFICIENCY, FRACTURES): Vit D, 25-Hydroxy: 43 ng/mL (ref 30–89)

## 2010-05-26 LAB — CANCER ANTIGEN 27.29: CA 27.29: 25 U/mL (ref 0–39)

## 2010-10-29 ENCOUNTER — Encounter: Payer: Self-pay | Admitting: Oncology

## 2010-10-30 ENCOUNTER — Encounter: Payer: Self-pay | Admitting: Oncology

## 2011-02-23 NOTE — Op Note (Signed)
NAME:  Bethany Ray, Bethany Ray               ACCOUNT NO.:  [id number]   MEDICAL RECORD NO.:  [id number]          PATIENT TYPE:  AMB   LOCATION:  DSC                          FACILITY:  [facility]   PHYSICIAN:  Timothy E. Earlene Plater, M.D. DATE OF BIRTH:  Feb 02, 1946   DATE OF PROCEDURE:  03/11/2006  DATE OF DISCHARGE:                                 OPERATIVE REPORT   PREOPERATIVE DIAGNOSIS:  Carcinoma of right breast.   POSTOPERATIVE DIAGNOSIS:  Carcinoma of right breast.   PROCEDURE:  Needle localization, blue dye injection, sentinel node biopsy x3  and partial mastectomy, all right.   SURGEON:  Timothy E. Earlene Plater, M.D.   ANESTHESIA:  General.   INDICATION:  Ms. Hettinger is 25 and otherwise healthy, except for obesity,  hypertension and history of elevated glucose.  She had abnormal mammogram.  Core biopsy revealed invasive carcinoma.  She has been carefully counseled  and after a short delay because of an upper respiratory tract infection, she  is now prepared for this surgery.  It is been carefully explained.  The  patient was seen in Preop, where nuclear technetium was injected in the  right subareolar area.  Likewise, she was identified and the right breast  marked.  She had previously been to Dr. Cherlyn Labella office for needle  localization. By x-ray and inspection, the wire entered the right breast at  approximately the 3 o'clock position and the wire penetrated the tumor mass  and just beyond it, which was located in the 2 o'clock position.   DESCRIPTION OF PROCEDURE:  The patient was taken to the operating room,  placed supine, general endotracheal anesthesia administered.  The right  breast was inspected, then blue dye mixed 2:3 was injected subareolar,  right, and massaged well for approximately 5  minutes.  The right breast was  then prepped and draped in usual fashion.  The skin was marked for the  needle localization in the estimated location of the tumor.  Using the  Neoprobe, there was 1  hot area in the mid-axilla that was subsequently  admitted marked.  Marcaine 0.25% with epinephrine was used prior to each  incision.  The axillary incision was made.  There were 2 lymph nodes, 1  large and 1 small, both blue, both hot, in the mid-axilla; these were  separately excised and submitted.  Careful probing of the axillary contents  revealed an additional node that was high and deep and it turned out to be a  very small node that was hot, but not blue; that too was submitted.  There  was no bleeding or complications in the axilla.  That wound was covered.   Attention was turned to the breast.  Again, Marcaine was used, a generous  incision made beginning at the junction of the superficial and deep subcu.  A plane of dissection was established well beyond the now palpable tumor in  the needle-localization needle and shaft and this was carried.  It  eventually encompassed the entire upper inner quadrant down to the  pectoralis fascia posteriorly and well under the nipple.  No other breast  tissue was encountered except near the nipple.  This entire mass was removed  and carefully marked and sent to Dr. Yolanda Bonine for adequacy of excision by x-  ray and then to the lab for adequacy of excision by touch preps.  That wound  was made try with the cautery and again, there were no complications.   We did receive from the pathologist a call revealing all 3 lymph nodes to be  negative and so the axillary wound was closed in layers with Vicryl and  Monocryl and then from Dr. Yolanda Bonine, a call was received showing that the  tumor seemed by x-ray to be well removed by the excision.  We did wait some  time, but heard from Pathology that the touch preps were all negative.  So,  the wound was closed in the breast with layers of Vicryl and Monocryl, Steri-  Strips applied, all counts correct.  She had tolerated it well, was awakened  and taken to the recovery room.   Written and verbal  instructions were given to her and her husband including  Vicodin and she will be seen and followed as an outpatient.      Timothy E. Earlene Plater, M.D.  Electronically Signed     TED/MEDQ  D:  03/11/2006  T:  03/12/2006  Job:  [id number]   cc:   Jeralyn Ruths MD   Huel Cote, M.D.  Fax: [phone]   Kingsley Callander. Ouida Sills, MD  Fax: [phone]

## 2011-05-10 ENCOUNTER — Other Ambulatory Visit (HOSPITAL_COMMUNITY): Payer: Self-pay | Admitting: Internal Medicine

## 2011-05-10 ENCOUNTER — Ambulatory Visit (HOSPITAL_COMMUNITY)
Admission: RE | Admit: 2011-05-10 | Discharge: 2011-05-10 | Disposition: A | Discharge status: Home / Self Care | Payer: Medicare Other | Source: Ambulatory Visit | Attending: Internal Medicine | Admitting: Internal Medicine

## 2011-05-10 DIAGNOSIS — Z853 Personal history of malignant neoplasm of breast: Secondary | ICD-10-CM | POA: Insufficient documentation

## 2011-05-10 DIAGNOSIS — M7989 Other specified soft tissue disorders: Secondary | ICD-10-CM

## 2011-09-05 ENCOUNTER — Ambulatory Visit (HOSPITAL_BASED_OUTPATIENT_CLINIC_OR_DEPARTMENT_OTHER): Payer: Medicare Other | Admitting: Oncology

## 2011-09-05 ENCOUNTER — Other Ambulatory Visit: Payer: Self-pay | Admitting: Oncology

## 2011-09-05 ENCOUNTER — Other Ambulatory Visit (HOSPITAL_BASED_OUTPATIENT_CLINIC_OR_DEPARTMENT_OTHER): Payer: Medicare Other | Admitting: Lab

## 2011-09-05 ENCOUNTER — Telehealth: Payer: Self-pay | Admitting: Oncology

## 2011-09-05 DIAGNOSIS — E559 Vitamin D deficiency, unspecified: Secondary | ICD-10-CM

## 2011-09-05 DIAGNOSIS — I10 Essential (primary) hypertension: Secondary | ICD-10-CM

## 2011-09-05 DIAGNOSIS — C50419 Malignant neoplasm of upper-outer quadrant of unspecified female breast: Secondary | ICD-10-CM

## 2011-09-05 DIAGNOSIS — E119 Type 2 diabetes mellitus without complications: Secondary | ICD-10-CM

## 2011-09-05 DIAGNOSIS — C50919 Malignant neoplasm of unspecified site of unspecified female breast: Secondary | ICD-10-CM

## 2011-09-05 DIAGNOSIS — C50219 Malignant neoplasm of upper-inner quadrant of unspecified female breast: Secondary | ICD-10-CM

## 2011-09-05 DIAGNOSIS — Z1231 Encounter for screening mammogram for malignant neoplasm of breast: Secondary | ICD-10-CM

## 2011-09-05 LAB — CBC WITH DIFFERENTIAL/PLATELET
Basophils Absolute: 0 10*3/uL (ref 0.0–0.1)
EOS%: 3.8 % (ref 0.0–7.0)
HCT: 34.3 % — ABNORMAL LOW (ref 34.8–46.6)
HGB: 11.4 g/dL — ABNORMAL LOW (ref 11.6–15.9)
LYMPH%: 15 % (ref 14.0–49.7)
MCH: 27.9 pg (ref 25.1–34.0)
MCV: 83.9 fL (ref 79.5–101.0)
MONO%: 7.1 % (ref 0.0–14.0)
NEUT%: 73.8 % (ref 38.4–76.8)
Platelets: 398 10*3/uL (ref 145–400)

## 2011-09-05 NOTE — Telephone Encounter (Signed)
gv pt appt for nov2013 °

## 2011-09-05 NOTE — Progress Notes (Signed)
Hematology and Oncology Follow Up Visit  Bethany Ray [id number] [birth year] 65 y.o. 09/05/2011 6:57 PM   Principle Diagnosis: 65 year old woman with history of stage II a breast cancer, status post lumpectomy and lymph node dissection 4 cycles of TEC chemotherapy and radiation completed 2007 on adjuvant Arimidex  2. history of type 2 diabetes and hypertension  Interim History:  Bethany Ray returns for followup. She is doing reasonably well. She continues to work and also look after some of her grandchildren. She's been pretty busy with her family she continues to have some discomfort around her shoulder and the right side which is at the past also has some GI symptoms. Overall her appetite is good weight is stable she has minimal hot flashes. She recently completed a prescription for Arimidex and has not refilled this as she has completed 5 years.  Medications: I have reviewed the patient's current medications.  Allergies:  Allergies  Allergen Reactions  . Penicillins     Past Medical History, Surgical history, Social history, and Family History were reviewed and updated.  Review of Systems: Constitutional:  Negative for fever, chills, night sweats, anorexia, weight loss, pain. Cardiovascular: no chest pain or dyspnea on exertion Respiratory: no cough, shortness of breath, or wheezing Neurological: no TIA or stroke symptoms Dermatological: negative ENT: negative Skin Gastrointestinal: no abdominal pain, change in bowel habits, or black or bloody stools Genito-Urinary: no dysuria, trouble voiding, or hematuria Hematological and Lymphatic: negative Breast: negative for breast lumps Musculoskeletal: negative Remaining ROS negative.  Physical Exam: Blood pressure 146/75, pulse 66, temperature 98.7 F (37.1 C), height 4' 11.5" (1.511 m), weight 174 lb 11.2 oz (79.243 kg). ECOG: 0 General appearance: alert, cooperative and appears stated age Head: Normocephalic, without obvious  abnormality, atraumatic Neck: no adenopathy, no carotid bruit, no JVD, supple, symmetrical, trachea midline and thyroid not enlarged, symmetric, no tenderness/mass/nodules Lymph nodes: Cervical, supraclavicular, and axillary nodes normal. Cardiac : Normal heart sounds Pulmonary: Normal breath sounds, no rales or rhonchi Breasts: Right and left breasts are free of any masses left breast status post lumpectomy, no evidence of local recurrence. Both axilla palpable negative. Abdomen: Soft nontender, no organomegaly or masses Extremities normal Neuro: Normal  Lab Results: Lab Results  Component Value Date   WBC 10.8* 09/05/2011   HGB 11.4* 09/05/2011   HCT 34.3* 09/05/2011   MCV 83.9 09/05/2011   PLT 398 09/05/2011     Chemistry      Component Value Date/Time   NA 137 05/25/2010 1458   K 4.3 05/25/2010 1458   CL 101 05/25/2010 1458   CO2 27 05/25/2010 1458   BUN 11 05/25/2010 1458   CREATININE 0.79 05/25/2010 1458   GLU 181* 04/26/2006 1212      Component Value Date/Time   CALCIUM 9.6 05/25/2010 1458   ALKPHOS 76 05/25/2010 1458   AST 25 05/25/2010 1458   ALT 26 05/25/2010 1458   BILITOT 0.5 05/25/2010 1458       Radiological Studies: chest X-ray n/a Mammogram pending Bone density pending  Impression and Plan: As a postmenopausal woman status post treatment for stage II a breast cancer with surgery, chemotherapy radiation and 5 years of her fracture. She will have followup imaging studies in the near I'll start to see her at yearly intervals. She has migratory aches and pains which has not suspect are of significance that we have done imaging studies in the past. Overall she is doing well.  More than 50% of the visit was spent in  patient-related counselling   Pierce Crane, MD 11/28/20126:57 PM

## 2011-09-06 LAB — COMPREHENSIVE METABOLIC PANEL
AST: 16 U/L (ref 0–37)
Albumin: 4 g/dL (ref 3.5–5.2)
BUN: 16 mg/dL (ref 6–23)
CO2: 26 mEq/L (ref 19–32)
Calcium: 10.1 mg/dL (ref 8.4–10.5)
Chloride: 105 mEq/L (ref 96–112)
Creatinine, Ser: 0.97 mg/dL (ref 0.50–1.10)
Glucose, Bld: 133 mg/dL — ABNORMAL HIGH (ref 70–99)
Potassium: 3.9 mEq/L (ref 3.5–5.3)

## 2011-09-06 LAB — CANCER ANTIGEN 27.29: CA 27.29: 33 U/mL (ref 0–39)

## 2011-11-03 ENCOUNTER — Emergency Department (HOSPITAL_COMMUNITY): Payer: Medicare Other

## 2011-11-03 ENCOUNTER — Other Ambulatory Visit: Payer: Self-pay

## 2011-11-03 ENCOUNTER — Inpatient Hospital Stay (HOSPITAL_COMMUNITY)
Admission: EM | Admit: 2011-11-03 | Discharge: 2011-11-09 | DRG: 194 | Disposition: A | Discharge status: Home / Self Care | Payer: Medicare Other | Attending: Internal Medicine | Admitting: Internal Medicine

## 2011-11-03 ENCOUNTER — Encounter (HOSPITAL_COMMUNITY): Payer: Self-pay | Admitting: *Deleted

## 2011-11-03 DIAGNOSIS — E871 Hypo-osmolality and hyponatremia: Secondary | ICD-10-CM | POA: Diagnosis present

## 2011-11-03 DIAGNOSIS — I10 Essential (primary) hypertension: Secondary | ICD-10-CM | POA: Diagnosis present

## 2011-11-03 DIAGNOSIS — D72829 Elevated white blood cell count, unspecified: Secondary | ICD-10-CM | POA: Diagnosis present

## 2011-11-03 DIAGNOSIS — J189 Pneumonia, unspecified organism: Principal | ICD-10-CM | POA: Diagnosis present

## 2011-11-03 DIAGNOSIS — Z853 Personal history of malignant neoplasm of breast: Secondary | ICD-10-CM

## 2011-11-03 DIAGNOSIS — E785 Hyperlipidemia, unspecified: Secondary | ICD-10-CM | POA: Diagnosis present

## 2011-11-03 DIAGNOSIS — M069 Rheumatoid arthritis, unspecified: Secondary | ICD-10-CM | POA: Diagnosis present

## 2011-11-03 DIAGNOSIS — E119 Type 2 diabetes mellitus without complications: Secondary | ICD-10-CM | POA: Diagnosis present

## 2011-11-03 HISTORY — DX: Essential (primary) hypertension: I10

## 2011-11-03 HISTORY — DX: Unspecified osteoarthritis, unspecified site: M19.90

## 2011-11-03 HISTORY — DX: Malignant (primary) neoplasm, unspecified: C80.1

## 2011-11-03 LAB — POCT I-STAT TROPONIN I: Troponin i, poc: 0 ng/mL (ref 0.00–0.08)

## 2011-11-03 LAB — BASIC METABOLIC PANEL
CO2: 25 mEq/L (ref 19–32)
Chloride: 100 mEq/L (ref 96–112)
Glucose, Bld: 148 mg/dL — ABNORMAL HIGH (ref 70–99)
Potassium: 3.8 mEq/L (ref 3.5–5.1)
Sodium: 135 mEq/L (ref 135–145)

## 2011-11-03 LAB — CBC
Hemoglobin: 12 g/dL (ref 12.0–15.0)
MCH: 27.6 pg (ref 26.0–34.0)
Platelets: 385 10*3/uL (ref 150–400)
RBC: 4.35 MIL/uL (ref 3.87–5.11)
WBC: 14.8 10*3/uL — ABNORMAL HIGH (ref 4.0–10.5)

## 2011-11-03 LAB — GLUCOSE, CAPILLARY: Glucose-Capillary: 182 mg/dL — ABNORMAL HIGH (ref 70–99)

## 2011-11-03 LAB — CARDIAC PANEL(CRET KIN+CKTOT+MB+TROPI): Total CK: 69 U/L (ref 7–177)

## 2011-11-03 LAB — D-DIMER, QUANTITATIVE: D-Dimer, Quant: 2.99 ug/mL-FEU — ABNORMAL HIGH (ref 0.00–0.48)

## 2011-11-03 MED ORDER — DEXTROSE 5 % IV SOLN
INTRAVENOUS | Status: AC
  Filled 2011-11-03: qty 500

## 2011-11-03 MED ORDER — MORPHINE SULFATE 4 MG/ML IJ SOLN
4.0000 mg | Freq: Once | INTRAMUSCULAR | Status: AC
  Administered 2011-11-03: 4 mg via INTRAVENOUS
  Filled 2011-11-03: qty 1

## 2011-11-03 MED ORDER — HYDROCHLOROTHIAZIDE 25 MG PO TABS
25.0000 mg | ORAL_TABLET | Freq: Every day | ORAL | Status: DC
  Administered 2011-11-04 – 2011-11-09 (×6): 25 mg via ORAL
  Filled 2011-11-03 (×8): qty 1

## 2011-11-03 MED ORDER — DEXTROSE 5 % IV SOLN
INTRAVENOUS | Status: AC
  Administered 2011-11-03: 19:00:00
  Filled 2011-11-03: qty 10

## 2011-11-03 MED ORDER — TRAMADOL HCL 50 MG PO TABS
50.0000 mg | ORAL_TABLET | Freq: Three times a day (TID) | ORAL | Status: DC
  Administered 2011-11-03 – 2011-11-09 (×17): 50 mg via ORAL
  Filled 2011-11-03 (×17): qty 1

## 2011-11-03 MED ORDER — ACETAMINOPHEN 650 MG RE SUPP: 650.0000 mg | Freq: Four times a day (QID) | RECTAL | Status: DC | PRN

## 2011-11-03 MED ORDER — LOSARTAN POTASSIUM-HCTZ 100-25 MG PO TABS: 1.0000 | ORAL_TABLET | Freq: Every day | ORAL | Status: DC

## 2011-11-03 MED ORDER — INSULIN ASPART 100 UNIT/ML ~~LOC~~ SOLN
0.0000 [IU] | SUBCUTANEOUS | Status: DC
  Administered 2011-11-04 (×2): 2 [IU] via SUBCUTANEOUS
  Administered 2011-11-04: 5 [IU] via SUBCUTANEOUS
  Administered 2011-11-04: 3 [IU] via SUBCUTANEOUS
  Administered 2011-11-05: 2 [IU] via SUBCUTANEOUS
  Administered 2011-11-05: 3 [IU] via SUBCUTANEOUS
  Administered 2011-11-05 – 2011-11-06 (×4): 2 [IU] via SUBCUTANEOUS
  Administered 2011-11-06: 3 [IU] via SUBCUTANEOUS
  Filled 2011-11-03 (×2): qty 3

## 2011-11-03 MED ORDER — SODIUM CHLORIDE 0.9 % IV BOLUS (SEPSIS)
1000.0000 mL | Freq: Once | INTRAVENOUS | Status: AC
  Administered 2011-11-03: 1000 mL via INTRAVENOUS

## 2011-11-03 MED ORDER — LOSARTAN POTASSIUM 50 MG PO TABS
100.0000 mg | ORAL_TABLET | Freq: Every day | ORAL | Status: DC
  Administered 2011-11-04 – 2011-11-09 (×6): 100 mg via ORAL
  Filled 2011-11-03 (×2): qty 2
  Filled 2011-11-03 (×2): qty 1
  Filled 2011-11-03 (×5): qty 2

## 2011-11-03 MED ORDER — DEXTROSE 5 % IV SOLN
1.0000 g | INTRAVENOUS | Status: DC
  Administered 2011-11-03 – 2011-11-07 (×5): 1 g via INTRAVENOUS
  Filled 2011-11-03 (×4): qty 10

## 2011-11-03 MED ORDER — ASPIRIN 81 MG PO CHEW
324.0000 mg | CHEWABLE_TABLET | Freq: Once | ORAL | Status: AC
  Administered 2011-11-03: 324 mg via ORAL
  Filled 2011-11-03: qty 4

## 2011-11-03 MED ORDER — FOLIC ACID 1 MG PO TABS
1.0000 mg | ORAL_TABLET | Freq: Every day | ORAL | Status: DC
  Administered 2011-11-03 – 2011-11-09 (×7): 1 mg via ORAL
  Filled 2011-11-03 (×7): qty 1

## 2011-11-03 MED ORDER — SODIUM CHLORIDE 0.9 % IJ SOLN: 3.0000 mL | INTRAMUSCULAR | Status: DC | PRN

## 2011-11-03 MED ORDER — ALUM & MAG HYDROXIDE-SIMETH 200-200-20 MG/5ML PO SUSP
30.0000 mL | Freq: Four times a day (QID) | ORAL | Status: DC | PRN
  Administered 2011-11-04: 30 mL via ORAL
  Filled 2011-11-03: qty 30

## 2011-11-03 MED ORDER — IOHEXOL 350 MG/ML SOLN
100.0000 mL | Freq: Once | INTRAVENOUS | Status: AC | PRN
  Administered 2011-11-03: 100 mL via INTRAVENOUS

## 2011-11-03 MED ORDER — ACETAMINOPHEN 325 MG PO TABS
650.0000 mg | ORAL_TABLET | Freq: Four times a day (QID) | ORAL | Status: DC | PRN
  Administered 2011-11-04 (×2): 650 mg via ORAL
  Filled 2011-11-03 (×2): qty 2

## 2011-11-03 MED ORDER — SODIUM CHLORIDE 0.9 % IJ SOLN
3.0000 mL | Freq: Two times a day (BID) | INTRAMUSCULAR | Status: DC
  Administered 2011-11-04 – 2011-11-07 (×3): 3 mL via INTRAVENOUS
  Filled 2011-11-03 (×5): qty 3

## 2011-11-03 MED ORDER — ONDANSETRON HCL 4 MG/2ML IJ SOLN: 4.0000 mg | Freq: Four times a day (QID) | INTRAMUSCULAR | Status: DC | PRN

## 2011-11-03 MED ORDER — AZITHROMYCIN 500 MG IV SOLR
500.0000 mg | INTRAVENOUS | Status: DC
  Administered 2011-11-03 – 2011-11-07 (×5): 500 mg via INTRAVENOUS
  Filled 2011-11-03 (×4): qty 500

## 2011-11-03 MED ORDER — ASPIRIN 81 MG PO CHEW
81.0000 mg | CHEWABLE_TABLET | Freq: Every day | ORAL | Status: DC
  Administered 2011-11-04 – 2011-11-09 (×6): 81 mg via ORAL
  Filled 2011-11-03 (×6): qty 1

## 2011-11-03 MED ORDER — KETOROLAC TROMETHAMINE 30 MG/ML IJ SOLN
30.0000 mg | Freq: Once | INTRAMUSCULAR | Status: AC
  Administered 2011-11-03: 30 mg via INTRAVENOUS
  Filled 2011-11-03: qty 1

## 2011-11-03 MED ORDER — ONDANSETRON HCL 4 MG PO TABS: 4.0000 mg | ORAL_TABLET | Freq: Four times a day (QID) | ORAL | Status: DC | PRN

## 2011-11-03 MED ORDER — SODIUM CHLORIDE 0.9 % IV SOLN: 250.0000 mL | INTRAVENOUS | Status: DC | PRN

## 2011-11-03 MED ORDER — ENOXAPARIN SODIUM 40 MG/0.4ML ~~LOC~~ SOLN
40.0000 mg | SUBCUTANEOUS | Status: DC
  Administered 2011-11-03 – 2011-11-08 (×6): 40 mg via SUBCUTANEOUS
  Filled 2011-11-03 (×6): qty 0.4

## 2011-11-03 NOTE — ED Notes (Signed)
Pt c/o sharp cp to left side of chest. Pt states pain began yesterday am. Pt states pain moves around chest.

## 2011-11-03 NOTE — ED Provider Notes (Signed)
Scribed for Dayton Bailiff, MD, the patient was seen in room APA02/APA02 . This chart was scribed by Ellie Lunch.   CSN: [id number]  Arrival date & time 11/03/11  1432   First MD Initiated Contact with Patient 11/03/11 1458      Chief Complaint  Patient presents with  . Chest Pain    (Consider location/radiation/quality/duration/timing/severity/associated sxs/prior treatment) HPI Pt seen at 3:04 PM Bethany Ray is a 66 y.o. female who presents to the Emergency Department complaining of 1 day of sudden onset left sided chest pain. Pain began yesterday spontaneously with no clear precipitating event. Pain radiates down to the left side. Pain is worse with deep breath and walking. Pt denies SOB or recent cough. Pt took 81 mg of asprin with no improvement.  No h/o of heart problems. H/o of hypertension, DM, cancer.  Pain is present parasternally and radiates to the L lateral chest.     Past Medical History  Diagnosis Date  . Cancer   . Diabetes mellitus   . Hypertension   . Arthritis     Past Surgical History  Procedure Date  . Abdominal hysterectomy   . Cholecystectomy   . Breast surgery     History reviewed. No pertinent family history.  History  Substance Use Topics  . Smoking status: Never Smoker   . Smokeless tobacco: Not on file  . Alcohol Use: No    Review of Systems  Constitutional: Negative for fever and chills.  HENT: Negative for congestion and sore throat.   Eyes: Negative for visual disturbance.  Respiratory: Negative for cough and shortness of breath.   Cardiovascular: Positive for chest pain. Negative for leg swelling.  Gastrointestinal: Negative for nausea, vomiting, abdominal pain and diarrhea.  Genitourinary: Negative for dysuria and hematuria.  Musculoskeletal: Negative for myalgias and back pain.  Skin: Negative for rash.  Neurological: Negative for dizziness, weakness, light-headedness, numbness and headaches.  Psychiatric/Behavioral: Negative  for suicidal ideas and confusion.  All other systems reviewed and are negative.    Allergies  Penicillins  Home Medications   Current Outpatient Rx  Name Route Sig Dispense Refill  . ETODOLAC 400 MG PO TABS Oral Take 400 mg by mouth 2 (two) times daily.      Marland Kitchen FOLIC ACID 1 MG PO TABS Oral Take 1 mg by mouth daily.    Marland Kitchen GLIMEPIRIDE 1 MG PO TABS Oral Take 1 mg by mouth daily.    Marland Kitchen LOSARTAN POTASSIUM-HCTZ 100-25 MG PO TABS Oral Take 1 tablet by mouth daily.    Marland Kitchen METFORMIN HCL ER (MOD) 500 MG PO TB24 Oral Take 1,000 mg by mouth daily.    Marland Kitchen METHOTREXATE CHEMO INJECTION (PF) 1 GM **50 MG/ML** Intravenous Inject 0.4 mg into the vein once a week. Patient injects 0.4cc's weekly on Monday...    . ADULT MULTIVITAMIN W/MINERALS CH Oral Take 1 tablet by mouth daily.    Marland Kitchen OLMESARTAN MEDOXOMIL-HCTZ 40-25 MG PO TABS Oral Take 1 tablet by mouth daily.    Marland Kitchen PRAVASTATIN SODIUM 20 MG PO TABS Oral Take 20 mg by mouth daily.    Marland Kitchen PREDNISONE 5 MG PO TABS Oral Take 5 mg by mouth daily.    . TRAMADOL HCL 50 MG PO TABS Oral Take 50 mg by mouth 3 (three) times daily.      BP 124/67  Temp(Src) 99.9 F (37.7 C) (Oral)  Resp 18  Ht 5\' 1"  (1.549 m)  Wt 172 lb (78.019 kg)  BMI 32.50 kg/m2  SpO2 96%  Physical Exam  Nursing note and vitals reviewed. Constitutional: She is oriented to person, place, and time. She appears well-developed and well-nourished. No distress.  HENT:  Head: Normocephalic and atraumatic.  Eyes: Conjunctivae and EOM are normal.  Neck: Normal range of motion. Neck supple.  Cardiovascular: Normal rate, regular rhythm and normal heart sounds.  Exam reveals no gallop and no friction rub.   No murmur heard. Pulmonary/Chest: Effort normal.       diminshed breath sounds Parasternal and left lateral chest wall pain  Abdominal: Soft. There is no tenderness.  Musculoskeletal: Normal range of motion. She exhibits no edema.  Neurological: She is alert and oriented to person, place, and time.    Skin: Skin is warm and dry.  Psychiatric: She has a normal mood and affect.    ED Course  Procedures (including critical care time)  CRITICAL CARE Performed by: Dayton Bailiff   Total critical care time: 30 min  Critical care time was exclusive of separately billable procedures and treating other patients.  Critical care was necessary to treat or prevent imminent or life-threatening deterioration.  Critical care was time spent personally by me on the following activities: development of treatment plan with patient and/or surrogate as well as nursing, discussions with consultants, evaluation of patient's response to treatment, examination of patient, obtaining history from patient or surrogate, ordering and performing treatments and interventions, ordering and review of laboratory studies, ordering and review of radiographic studies, pulse oximetry and re-evaluation of patient's condition.   Date: 11/03/2011  Rate: 86  Rhythm: normal sinus rhythm  QRS Axis: left  Intervals: normal  ST/T Wave abnormalities: normal  Conduction Disutrbances:none  Narrative Interpretation:   Old EKG Reviewed: unchanged  DIAGNOSTIC STUDIES: Oxygen Saturation is 96% on room air, normal by my interpretation.    COORDINATION OF CARE:   Labs Reviewed  CBC - Abnormal; Notable for the following:    WBC 14.8 (*)    RDW 15.9 (*)    All other components within normal limits  BASIC METABOLIC PANEL - Abnormal; Notable for the following:    Glucose, Bld 148 (*)    GFR calc non Af Amer 85 (*)    All other components within normal limits  D-DIMER, QUANTITATIVE - Abnormal; Notable for the following:    D-Dimer, Quant 2.99 (*)    All other components within normal limits  POCT I-STAT TROPONIN I  I-STAT TROPONIN I   Dg Chest 2 View  11/03/2011  *RADIOLOGY REPORT*  Clinical Data: Chest pain  CHEST - 2 VIEW  Comparison: Chest radiograph 07/26/2009  Findings: Stable mildly enlarged heart silhouette.  There  are low lung volumes.  There is a bibasilar opacities representing atelectasis or infiltrates.  No pulmonary edema.  No pneumothorax.  IMPRESSION: Bibasilar atelectasis or infiltrates.  Original Report Authenticated By: Genevive Bi, M.D.   Ct Angio Chest W/cm &/or Wo Cm  11/03/2011  *RADIOLOGY REPORT*  Clinical Data: Chest pain and SOB.  Evaluate for pulmonary embolus. History of breast cancer.  CT ANGIOGRAPHY CHEST  Technique:  Multidetector CT imaging of the chest using the standard protocol during bolus administration of intravenous contrast. Multiplanar reconstructed images including MIPs were obtained and reviewed to evaluate the vascular anatomy.  Contrast: OMNIPAQUE IOHEXOL 350 MG/ML IV SOLN  Comparison: 04/18/2006  Findings: Left lobe of thyroid gland nodule measures 2.2 x 2.1 cm similar to previous exam.  No enlarged axillary or supraclavicular lymph nodes.  There is no mediastinal or  hilar adenopathy.  There is no pericardial effusion.  Small left pleural effusion is present.  Dependent changes and atelectasis is noted within the lung bases posteriorly.  No interstitial edema.  Stable right middle lobe and right lower lobe pulmonary parenchymal nodules.  Mild multilevel spondylosis within the thoracic spine noted.  Review of the visualized osseous structures is otherwise unremarkable.  No abnormal filling defects are noted within the main pulmonary artery or its branches to suggest an acute pulmonary embolus.  IMPRESSION:  1. 1.  Negative for acute pulmonary embolus. 2.  Small left effusion and bibasilar atelectasis. 3.  Right middle lobe and right lower lobe pulmonary nodules are stable from previous exam. 4.  Thyroid nodules.  Suggest further evaluation with thyroid sonography.  Original Report Authenticated By: Rosealee Albee, M.D.    ED MEDICATIONS Medications  sodium chloride 0.9 % bolus 1,000 mL   aspirin chewable tablet 324 mg (324 mg Oral Given 11/03/11 1505)  morphine 4 MG/ML  injection 4 mg (4 mg Intravenous Given 11/03/11 1538)  ketorolac (TORADOL) 30 MG/ML injection 30 mg (30 mg Intravenous Given 11/03/11 1640)    1. Chest pain       MDM  Patient with numerous risk factors for coronary artery disease including diabetes, hypertension. Although her pain sounds atypical I feel she warrants admission for rule out MI. Initial troponin and EKG are unremarkable. She had an elevated d-dimer but PE was ruled out with a negative CT PE. I spoke with her primary care physician Dr. Ouida Sills. He will evaluate the patient for admission. She received aspirin in the emergency department as well as morphine and Toradol.  Dissection also ruled out on negative ct chest  I personally performed the services described in this documentation, which was scribed in my presence. The recorded information has been reviewed and considered.       Dayton Bailiff, MD 11/03/11 [phone]

## 2011-11-04 LAB — CARDIAC PANEL(CRET KIN+CKTOT+MB+TROPI)
Relative Index: INVALID (ref 0.0–2.5)
Total CK: 61 U/L (ref 7–177)
Troponin I: <0.3 ng/mL (ref <0.30)
Troponin I: <0.3 ng/mL (ref <0.30)

## 2011-11-04 LAB — GLUCOSE, CAPILLARY
Glucose-Capillary: 192 mg/dL — ABNORMAL HIGH (ref 70–99)
Glucose-Capillary: 279 mg/dL — ABNORMAL HIGH (ref 70–99)

## 2011-11-04 MED ORDER — HYDROMORPHONE HCL PF 1 MG/ML IJ SOLN
0.5000 mg | INTRAMUSCULAR | Status: DC | PRN
  Administered 2011-11-04 – 2011-11-07 (×12): 0.5 mg via INTRAVENOUS
  Filled 2011-11-04 (×13): qty 1

## 2011-11-04 MED ORDER — HYDROCODONE-ACETAMINOPHEN 5-325 MG PO TABS
1.0000 | ORAL_TABLET | ORAL | Status: DC | PRN
  Administered 2011-11-04 – 2011-11-09 (×6): 1 via ORAL
  Filled 2011-11-04 (×7): qty 1

## 2011-11-04 NOTE — H&P (Signed)
NAME:  Bethany Ray, Bethany Ray               ACCOUNT NO.:  [id number]  MEDICAL RECORD NO.:  [id number]  LOCATION:  A301                          FACILITY:  [facility]  PHYSICIAN:  Kingsley Callander. Ouida Sills, MD       DATE OF BIRTH:  04-04-1946  DATE OF ADMISSION:  11/03/2011 DATE OF DISCHARGE:  LH                             HISTORY & PHYSICAL   CHIEF COMPLAINT:  Chest pain.  HISTORY OF PRESENT ILLNESS:  This patient is a 66 year old African American female who presented to the emergency room complaining of chest pain in the left chest radiating to the left side which began 1 day prior to admission.  She has had mild coughing.  She denied fever at home, but was febrile to 99.9 in the ER.  She denied diaphoresis, vomiting, or syncope.  Her pain was worse upon taking a deep breath. Her pain was worse when she walked around.  She has no prior history of heart disease, but does have risk factors of hypertension and diabetes. She does not smoke.  Her initial EKG revealed sinus rhythm with nonspecific T-wave changes.  Her initial troponin was negative at less than 0.03.  She had a D-dimer which was elevated, however, at 2.99.  She underwent a CT scan of the chest which revealed no pulmonary embolus. She did have a small left pleural effusion and bibasilar atelectasis. There were also right middle lobe and right lower lobe pulmonary nodules which were stable from her previous exam in 2007.  She also had a left lobe thyroid nodule measuring 2.2 x 2.1 cm similar to the previous exam. Her chest x-ray revealed bibasilar infiltrates versus atelectasis.  Her white count was elevated at 14.8.  PAST MEDICAL HISTORY: 1. Breast cancer. 2. Diabetes. 3. Hypertension. 4. Rheumatoid arthritis. 5. Hyperlipidemia.  MEDICATIONS: 1. Etodolac 400 mg b.i.d. 2. Folic acid 1 mg daily. 3. Glimepiride 1 mg daily. 4. Losartan HCT 100-25 daily. 5. Metformin 1000 mg daily. 6. Methotrexate 0.4 mg IM weekly. 7. Multivitamin daily. 8.  Pravastatin 20 mg at bedtime. 9. Prednisone was recently discontinued. 10.Tramadol 50 mg t.i.d. p.r.n.  ALLERGIES:  PENICILLIN.  SOCIAL HISTORY:  She does not use tobacco, alcohol, or drugs.  FAMILY HISTORY:  Remarkable for prostate cancer in her father.  No family history of coronary heart disease.  REVIEW OF SYSTEMS:  No nausea, vomiting, change in bowel habits, or difficulty voiding.  She has not recently experienced exertional angina- type symptoms.  PHYSICAL EXAMINATION:  VITAL SIGNS:  Temperature 98.1, pulse 70, respirations 20, blood pressure 132/76, oxygen saturation 93%. GENERAL:  Alert and in no distress. HEENT:  Eyes, nose, and pharynx unremarkable. NECK:  Supple with no JVD, thyromegaly, or lymphadenopathy. LUNGS:  Bibasilar rales. HEART:  Regular with no murmurs. CHEST:  Anterior chest wall was tender to palpation. ABDOMEN:  Soft, nontender, nondistended with no hepatosplenomegaly. EXTREMITIES:  No calf tenderness or swelling.  No cyanosis, clubbing, or edema. NEURO:  No focal deficits. LYMPH NODES:  No enlargement. SKIN:  Warm and dry.  LABORATORY DATA:  White count 14.8, hemoglobin 12, platelets 385,000. Sodium 135, potassium 3.8, bicarb 25, BUN 11, creatinine 0.79, calcium 9.9.  Troponin I  less than 0.30.  D-dimer 2.99.  EKG reveals normal sinus rhythm with nonspecific T-wave changes.  IMPRESSION/PLAN: 1. Community-acquired pneumonia.  She has fever, leukocytosis, a chest     x-ray revealing infiltrates versus atelectasis and a small left     pleural effusion on CT.  She will be treated with Rocephin and     Zithromax. 2. Chest pain, doubt cardiac.  We will check additional troponins and     repeat EKG. 3. Diabetes.  Hold metformin and glimepiride for now.  Treat with     sliding scale insulin (NovoLog) based on Accu-Cheks. 4. Hypertension, controlled. 5. Hyperlipidemia.  Continue pravastatin. 6. History of breast cancer. 7. Rheumatoid arthritis.   Recently initiated on methotrexate.     Kingsley Callander. Ouida Sills, MD     ROF/MEDQ  D:  11/04/2011  T:  11/04/2011  Job:  [id number]

## 2011-11-04 NOTE — Progress Notes (Signed)
NAME:  Bethany Ray, Bethany Ray               ACCOUNT NO.:  [id number]  MEDICAL RECORD NO.:  [id number]  LOCATION:  A301                          FACILITY:  [facility]  PHYSICIAN:  Kingsley Callander. Ouida Sills, MD       DATE OF BIRTH:  Feb 11, 1946  DATE OF PROCEDURE: DATE OF DISCHARGE:                                PROGRESS NOTE   She complains of left lateral pleuritic chest pain this morning.  Her temperature is 98.1, pulse 70, respirations 20, blood pressure 132/76, oxygen saturation is 93% on room air.  Cough remains mild.  She is not producing any sputum.  LUNGS:  Basilar rales. HEART:  Regular with no murmurs. CHEST:  Her anterior chest wall is tender. ABDOMEN:  Soft and nontender. NEURO:  Stable.  IMPRESSION/PLAN: 1. Community-acquired pneumonia.  Continue Rocephin and Zithromax. 2. Chest pain.  Troponins are thus far negative. 3. Diabetes.  Her Accu-Chek this morning is 161.  Metformin is on hold     following her CT angiogram.  Glimepiride is also on hold.  Continue     sliding scale NovoLog as needed.     Kingsley Callander. Ouida Sills, MD     ROF/MEDQ  D:  11/04/2011  T:  11/04/2011  Job:  [id number]

## 2011-11-05 ENCOUNTER — Observation Stay (HOSPITAL_COMMUNITY): Payer: Medicare Other

## 2011-11-05 LAB — GLUCOSE, CAPILLARY
Glucose-Capillary: 185 mg/dL — ABNORMAL HIGH (ref 70–99)
Glucose-Capillary: 187 mg/dL — ABNORMAL HIGH (ref 70–99)
Glucose-Capillary: 197 mg/dL — ABNORMAL HIGH (ref 70–99)

## 2011-11-05 LAB — CBC
HCT: 31.5 % — ABNORMAL LOW (ref 36.0–46.0)
Hemoglobin: 10.6 g/dL — ABNORMAL LOW (ref 12.0–15.0)
MCHC: 33.7 g/dL (ref 30.0–36.0)
RBC: 3.84 MIL/uL — ABNORMAL LOW (ref 3.87–5.11)

## 2011-11-05 LAB — URINALYSIS, ROUTINE W REFLEX MICROSCOPIC
Nitrite: POSITIVE — AB
Specific Gravity, Urine: 1.03 (ref 1.005–1.030)
Urobilinogen, UA: 1 mg/dL (ref 0.0–1.0)
pH: 5.5 (ref 5.0–8.0)

## 2011-11-05 LAB — URINE MICROSCOPIC-ADD ON

## 2011-11-05 MED ORDER — EPINEPHRINE HCL 1 MG/ML IJ SOLN
INTRAMUSCULAR | Status: AC
  Filled 2011-11-05: qty 5

## 2011-11-05 MED ORDER — BUPIVACAINE-EPINEPHRINE PF 0.5-1:200000 % IJ SOLN
INTRAMUSCULAR | Status: AC
  Filled 2011-11-05: qty 20

## 2011-11-05 NOTE — Progress Notes (Signed)
CARE MANAGEMENT NOTE 11/05/2011  Patient:  Bethany Ray, Bethany Ray   Account Number:  [id number]  Date Initiated:  11/05/2011  Documentation initiated by:  Rosemary Holms  Subjective/Objective Assessment:   Pt admitted from home with spouse.     Action/Plan:   CM to follow but no HH needs identified at this time   Anticipated DC Date:  11/08/2011   Anticipated DC Plan:  HOME W HOME HEALTH SERVICES      DC Planning Services  CM consult      Choice offered to / List presented to:             Status of service:  In process, will continue to follow Medicare Important Message given?   (If response is "NO", the following Medicare IM given date fields will be blank) Date Medicare IM given:   Date Additional Medicare IM given:    Discharge Disposition:    Per UR Regulation:    Comments:  11/05/11 1100 Chassity Ludke Leanord Hawking RN  BSN CM

## 2011-11-05 NOTE — Progress Notes (Signed)
Inpatient Diabetes Program Recommendations  AACE/ADA: New Consensus Statement on Inpatient Glycemic Control (2009)  Target Ranges:  Prepandial:   less than 140 mg/dL      Peak postprandial:   less than 180 mg/dL (1-2 hours)      Critically ill patients:  140 - 180 mg/dL   Reason for Visit: Elevated glucose 180-190s  Inpatient Diabetes Program Recommendations Correction (SSI): Change to Moderate Novolog TID and HS HgbA1C: Check HgbA1C:  To access glycemic control

## 2011-11-05 NOTE — Progress Notes (Signed)
UR Chart Review Completed  

## 2011-11-05 NOTE — Progress Notes (Signed)
Notified Dr. Ouida Sills that O2 dropped to 84% on room air, went up to 90% with 2L. Sheryn Bison

## 2011-11-05 NOTE — Progress Notes (Signed)
NAMEDAVIS, VANNATTER               ACCOUNT NO.:  [id number]  MEDICAL RECORD NO.:  [id number]  LOCATION:                                 FACILITY:  PHYSICIAN:  Kingsley Callander. Ouida Sills, MD       DATE OF BIRTH:  Feb 06, 1946  DATE OF PROCEDURE: DATE OF DISCHARGE:                                PROGRESS NOTE   She felt better overnight but continues to complain of pleuritic chest pain and now has left flank pain.  The urinalysis has not yet been obtained.  PHYSICAL EXAMINATION:  VITAL SIGNS:  She has had a maximum temperature of 99.8, pulse is 81, respirations 20, blood pressure 115/72.  Oxygen saturations have varied from 86-91. LUNGS:  She is reluctant to take deep breaths due to discomfort. HEART:  Regular with no murmurs. ABDOMEN:  Soft and nontender.  Left CVA tenderness is present.  IMPRESSION/PLAN: 1. Pneumonia.  Continue Rocephin and Zithromax.  Her white count has     increased to 25.4. 2. Left flank pain.  We will obtain urinalysis and CT scan of the     abdomen and pelvis. 3. Diabetes.  Glucose this morning is 187.  Continue sliding scale     NovoLog.  Metformin is being held.     Kingsley Callander. Ouida Sills, MD     ROF/MEDQ  D:  11/05/2011  T:  11/05/2011  Job:  [id number]

## 2011-11-06 LAB — BASIC METABOLIC PANEL
BUN: 13 mg/dL (ref 6–23)
CO2: 26 mEq/L (ref 19–32)
Calcium: 9.7 mg/dL (ref 8.4–10.5)
Creatinine, Ser: 0.73 mg/dL (ref 0.50–1.10)
Glucose, Bld: 192 mg/dL — ABNORMAL HIGH (ref 70–99)
Potassium: 3.8 mEq/L (ref 3.5–5.1)
Sodium: 129 mEq/L — ABNORMAL LOW (ref 135–145)

## 2011-11-06 LAB — GLUCOSE, CAPILLARY
Glucose-Capillary: 220 mg/dL — ABNORMAL HIGH (ref 70–99)
Glucose-Capillary: 230 mg/dL — ABNORMAL HIGH (ref 70–99)

## 2011-11-06 LAB — CBC
HCT: 32.3 % — ABNORMAL LOW (ref 36.0–46.0)
MCHC: 33.7 g/dL (ref 30.0–36.0)
Platelets: 374 10*3/uL (ref 150–400)
RDW: 15.9 % — ABNORMAL HIGH (ref 11.5–15.5)
WBC: 22.9 10*3/uL — ABNORMAL HIGH (ref 4.0–10.5)

## 2011-11-06 MED ORDER — INSULIN ASPART 100 UNIT/ML ~~LOC~~ SOLN
0.0000 [IU] | Freq: Every day | SUBCUTANEOUS | Status: DC
  Administered 2011-11-06: 2 [IU] via SUBCUTANEOUS
  Administered 2011-11-07: 3 [IU] via SUBCUTANEOUS
  Administered 2011-11-08: 2 [IU] via SUBCUTANEOUS

## 2011-11-06 MED ORDER — INSULIN ASPART 100 UNIT/ML ~~LOC~~ SOLN
0.0000 [IU] | Freq: Three times a day (TID) | SUBCUTANEOUS | Status: DC
  Administered 2011-11-06: 5 [IU] via SUBCUTANEOUS
  Administered 2011-11-06 – 2011-11-07 (×5): 3 [IU] via SUBCUTANEOUS
  Administered 2011-11-08: 5 [IU] via SUBCUTANEOUS
  Administered 2011-11-08: 3 [IU] via SUBCUTANEOUS
  Administered 2011-11-08: 2 [IU] via SUBCUTANEOUS
  Administered 2011-11-09: 3 [IU] via SUBCUTANEOUS
  Administered 2011-11-09: 2 [IU] via SUBCUTANEOUS

## 2011-11-06 NOTE — Progress Notes (Signed)
NAME:  Bethany Ray, Bethany Ray               ACCOUNT NO.:  [id number]  MEDICAL RECORD NO.:  [id number]  LOCATION:  A301                          FACILITY:  [facility]  PHYSICIAN:  Kingsley Callander. Ouida Sills, MD       DATE OF BIRTH:  01-29-1946  DATE OF PROCEDURE: DATE OF DISCHARGE:                                PROGRESS NOTE   She is feeling less pleuritic chest pain today.  Maximum temperature has been 99.3.  She has only a mild cough.  VITAL SIGNS:  Her oxygen saturation is 92% on supplemental oxygen, pulse 96, respirations 20, blood pressure 155/79. LUNGS:  Shallow but clear breath sounds. HEART:  Regular with no murmurs. ABDOMEN:  Soft and nontender.  IMPRESSION/PLAN: 1. Community-acquired pneumonia.  Continue Rocephin and Zithromax.     Her white count has improved to 22.9.  A CT scan of her abdomen and     pelvis yesterday revealed no abnormality in the left flank.  Her     symptoms felt to be related to the pleuritic pain she is feeling     from her left-sided pneumonia.  Consolidation was evident in the     left lower lobe.  No intra-abdominal abnormality was identified. 2. Diabetes.  Glucose this morning is 163 on her Accu-Cheks. 3. Possible urinary tract infection.  She has positive nitrite, but     only 0-2 wbc's with many bacteria and few squamous epithelial     cells.  Rocephin should provide adequate coverage.     Kingsley Callander. Ouida Sills, MD     ROF/MEDQ  D:  11/06/2011  T:  11/06/2011  Job:  [id number]

## 2011-11-06 NOTE — Plan of Care (Signed)
Problem: Phase I Progression Outcomes Goal: OOB as tolerated unless otherwise ordered Outcome: Completed/Met Date Met:  11/06/11 Pt ambulates in room

## 2011-11-07 ENCOUNTER — Inpatient Hospital Stay (HOSPITAL_COMMUNITY): Payer: Medicare Other

## 2011-11-07 LAB — CBC
HCT: 28.5 % — ABNORMAL LOW (ref 36.0–46.0)
MCV: 79.8 fL (ref 78.0–100.0)
Platelets: 410 10*3/uL — ABNORMAL HIGH (ref 150–400)
RBC: 3.57 MIL/uL — ABNORMAL LOW (ref 3.87–5.11)
WBC: 19 10*3/uL — ABNORMAL HIGH (ref 4.0–10.5)

## 2011-11-07 LAB — GLUCOSE, CAPILLARY
Glucose-Capillary: 193 mg/dL — ABNORMAL HIGH (ref 70–99)
Glucose-Capillary: 169 mg/dL — ABNORMAL HIGH (ref 70–99)

## 2011-11-07 MED ORDER — SODIUM CHLORIDE 0.9 % IJ SOLN
INTRAMUSCULAR | Status: AC
  Administered 2011-11-07: 15:00:00
  Filled 2011-11-07: qty 3

## 2011-11-07 MED ORDER — SODIUM CHLORIDE 0.9 % IJ SOLN
INTRAMUSCULAR | Status: AC
  Filled 2011-11-07: qty 3

## 2011-11-07 NOTE — Progress Notes (Signed)
NAME:  Bethany Ray, Bethany Ray               ACCOUNT NO.:  [id number]  MEDICAL RECORD NO.:  [id number]  LOCATION:  A301                          FACILITY:  [facility]  PHYSICIAN:  Kingsley Callander. Ouida Sills, MD       DATE OF BIRTH:  1946/08/25  DATE OF PROCEDURE: DATE OF DISCHARGE:                                PROGRESS NOTE   Lorre complains of wheezing overnight.  She continues to have pleuritic chest pain.  VITAL SIGNS:  Temperature is 98.7, pulse 78, respirations 16, blood pressure 126/75, oxygen saturation is 97% on 2 L. LUNGS:  Left-sided rales.  No wheezes are noted. HEART:  Regular with a grade 2 systolic murmur. ABDOMEN:  Soft and nontender. NEURO:  Stable.  IMPRESSION/PLAN: 1. Pneumonia.  Continue Rocephin and Zithromax.  Her white count     remains elevated, but is improved from 22.9 to 19.0.  Her     hemoglobin has trended down to 9.7 and platelets have trended     upward to 410,000.  Her oxygen saturation is satisfactory at     present.  We will repeat a chest x-ray today. 2. Diabetes.  Continue sliding scale NovoLog.  This morning's glucose     is pending.  Her glucoses yesterday ranged from 164 to 238.  We will also add incentive spirometry today.     Kingsley Callander. Ouida Sills, MD     ROF/MEDQ  D:  11/07/2011  T:  11/07/2011  Job:  [id number]

## 2011-11-08 ENCOUNTER — Inpatient Hospital Stay (HOSPITAL_COMMUNITY): Payer: Medicare Other

## 2011-11-08 LAB — BASIC METABOLIC PANEL
Calcium: 10 mg/dL (ref 8.4–10.5)
GFR calc non Af Amer: >90 mL/min (ref >90)
Potassium: 3.7 mEq/L (ref 3.5–5.1)
Sodium: 135 mEq/L (ref 135–145)

## 2011-11-08 LAB — CBC
MCH: 27 pg (ref 26.0–34.0)
MCHC: 33.7 g/dL (ref 30.0–36.0)
Platelets: 407 10*3/uL — ABNORMAL HIGH (ref 150–400)
RBC: 3.7 MIL/uL — ABNORMAL LOW (ref 3.87–5.11)

## 2011-11-08 LAB — GLUCOSE, CAPILLARY

## 2011-11-08 MED ORDER — CEFTRIAXONE SODIUM 1 G IJ SOLR
1.0000 g | INTRAMUSCULAR | Status: DC
  Administered 2011-11-08 – 2011-11-09 (×2): 1 g via INTRAMUSCULAR
  Filled 2011-11-08 (×5): qty 10

## 2011-11-08 MED ORDER — SODIUM CHLORIDE 0.9 % IJ SOLN
INTRAMUSCULAR | Status: AC
  Filled 2011-11-08: qty 3

## 2011-11-08 NOTE — Progress Notes (Signed)
NAME:  Bethany Ray, Bethany Ray               ACCOUNT NO.:  [id number]  MEDICAL RECORD NO.:  [id number]  LOCATION:  A301                          FACILITY:  [facility]  PHYSICIAN:  Kingsley Callander. Ouida Sills, MD       DATE OF BIRTH:  10/16/1945  DATE OF PROCEDURE:  11/08/2011 DATE OF DISCHARGE:                                PROGRESS NOTE   SUBJECTIVE:  She continues to feel discomfort in the chest.  She has not had a fever.  OBJECTIVE:  VITAL SIGNS:  Her temperature is 98.4, pulse 80, respirations 18, blood pressure 148/81, oxygen saturations 94% on supplemental oxygen.  She is using her incentive spirometer. LUNGS:  Reveal bilateral rales and dullness in the bases.  No wheezes. HEART:  Regular. ABDOMEN:  Nontender. NEURO:  Stable.  IMPRESSION/PLAN: 1. Bilateral pneumonia with bilateral pleural effusions.  A     thoracentesis will be ordered today.  Her white count is down to     15.1, hemoglobin is 10.0.  She has completed 5 days of azithromycin     which will be discontinued.  She will continue ceftriaxone.  Her IV     access is lost, so the ceftriaxone will be given IM. 2. Diabetes.  Fasting glucose is 142. 3. Hyponatremia.  Serum sodium is normalized from 129-135.     Kingsley Callander. Ouida Sills, MD     ROF/MEDQ  D:  11/08/2011  T:  11/08/2011  Job:  [id number]

## 2011-11-09 LAB — CBC
HCT: 28.6 % — ABNORMAL LOW (ref 36.0–46.0)
Hemoglobin: 9.7 g/dL — ABNORMAL LOW (ref 12.0–15.0)
MCV: 79.4 fL (ref 78.0–100.0)
RBC: 3.6 MIL/uL — ABNORMAL LOW (ref 3.87–5.11)
WBC: 16.3 10*3/uL — ABNORMAL HIGH (ref 4.0–10.5)

## 2011-11-09 LAB — GLUCOSE, CAPILLARY
Glucose-Capillary: 148 mg/dL — ABNORMAL HIGH (ref 70–99)
Glucose-Capillary: 191 mg/dL — ABNORMAL HIGH (ref 70–99)

## 2011-11-09 MED ORDER — CEFUROXIME AXETIL 500 MG PO TABS: 500.0000 mg | ORAL_TABLET | Freq: Two times a day (BID) | ORAL | Status: AC

## 2011-11-09 MED ORDER — ASPIRIN 81 MG PO CHEW: 81.0000 mg | CHEWABLE_TABLET | Freq: Every day | ORAL | Status: AC

## 2011-11-09 NOTE — Progress Notes (Signed)
CARE MANAGEMENT NOTE 11/09/2011  Patient:  BAYAN, HEDSTROM   Account Number:  [id number]  Date Initiated:  11/05/2011  Documentation initiated by:  Rosemary Holms  Subjective/Objective Assessment:   Pt admitted from home with spouse.     Action/Plan:   CM to follow but no HH needs identified at this time   Anticipated DC Date:  11/09/2011   Anticipated DC Plan:  HOME/SELF CARE      DC Planning Services  CM consult      Choice offered to / List presented to:             Status of service:  Completed, signed off Medicare Important Message given?   (If response is "NO", the following Medicare IM given date fields will be blank) Date Medicare IM given:   Date Additional Medicare IM given:    Discharge Disposition:  HOME/SELF CARE  Per UR Regulation:    Comments:  11/09/11 1300 Anibal Henderson RN D/C home 11/08/11 900 Sharlene Mccluskey RN BSN CM DC plans unchanged, no HH needs identified  11/05/11 1100 Suhaila Troiano Leanord Hawking RN  BSN CM

## 2011-11-09 NOTE — Progress Notes (Signed)
Pt discharged home with family.  Follow up appt with PCP in place.  Pt instructed on new medications and medication changes.  Instructed to take full course of antibiotics. Pt verbalizes understanding and has no questions at this time.

## 2011-11-09 NOTE — Discharge Summary (Signed)
NAME:  LILLER, YOHN               ACCOUNT NO.:  [id number]  MEDICAL RECORD NO.:  [id number]  LOCATION:  A301                          FACILITY:  [facility]  PHYSICIAN:  Kingsley Callander. Ouida Sills, MD       DATE OF BIRTH:  06/04/1946  DATE OF ADMISSION:  11/03/2011 DATE OF DISCHARGE:  LH                              DISCHARGE SUMMARY   DISCHARGE DIAGNOSES: 1. Community-acquired pneumonia. 2. Diabetes. 3. Hypertension. 4. Rheumatoid arthritis. 5. Hyperlipidemia. 6. History of breast cancer.  DISCHARGE MEDICATIONS: 1. Ceftin 500 mg b.i.d. for 7 days. 2. Folic acid 1 mg daily. 3. Glimepiride 1 mg daily. 4. Losartan HCT 100/25 mg daily. 5. Metformin 1000 mg daily. 6. Methotrexate 0.4 mg IM weekly. 7. Multivitamin daily. 8. Pravastatin 20 mg at bedtime. 9. Tramadol 50 mg t.i.d. p.r.n.  HOSPITAL COURSE:  This patient is a 66 year old female who presented to the emergency room with pleuritic chest pain, low-grade fever, and leukocytosis.  Her chest x-ray revealed bibasilar infiltrates.  She had been evaluated with a CT scan of the chest in the emergency room because of an elevated D-dimer of 2.99.  She was found to have a small left pleural effusion and bibasilar infiltrates versus atelectasis.  There were small pulmonary nodules stable from a prior exam in 2007.  There was also a left lobe thyroid nodule, which was stable from previous exam.  She was treated with Rocephin and Zithromax.  Her initial white count was 14.8.  Her repeat white count increased to 22.9 and then improved to 15.1, but it is 16.3 on the day of discharge.  Her fever resolved.  Her repeat chest x-ray revealed bilateral lower lobe infiltrates and possible effusions.  A thoracentesis was scheduled but the ultrasound revealed only minimal fluid and the risk of pneumothorax was felt to outweigh the benefit.  Her symptoms improved.  She required oxygen intermittently, however, is oxygenating at 95% on room air prior to  discharge.  She had complaint of left flank pain also and underwent a CT scan of the abdomen.  No intra-abdominal abnormality was identified, however, the left lower lobe infiltrate was visible on the scan.  Her diabetes was managed with sliding scale NovoLog.  Metformin and glimepiride were held.  She has been treated with prednisone and methotrexate recently by Rheumatology.  She has completed her prednisone course.  Methotrexate is being held for now.  She will be modified to an oral antibiotic course of Ceftin 500 mg b.i.d. and will be seen in followup in the office in 1 week.  She will have a chest x-ray prior to that and will be given a pneumococcal vaccine at that time.  Her condition at discharge is much improved.     Kingsley Callander. Ouida Sills, MD     ROF/MEDQ  D:  11/09/2011  T:  11/09/2011  Job:  [id number]

## 2011-11-12 NOTE — Progress Notes (Signed)
Utilization review completed.  

## 2011-11-15 ENCOUNTER — Other Ambulatory Visit (HOSPITAL_COMMUNITY): Payer: Self-pay | Admitting: Internal Medicine

## 2011-11-15 ENCOUNTER — Ambulatory Visit (HOSPITAL_COMMUNITY)
Admission: RE | Admit: 2011-11-15 | Discharge: 2011-11-15 | Disposition: A | Discharge status: Home / Self Care | Payer: Medicare Other | Source: Ambulatory Visit | Attending: Internal Medicine | Admitting: Internal Medicine

## 2011-11-15 DIAGNOSIS — J189 Pneumonia, unspecified organism: Secondary | ICD-10-CM

## 2012-01-08 ENCOUNTER — Emergency Department (HOSPITAL_COMMUNITY): Payer: Medicare Other

## 2012-01-08 ENCOUNTER — Other Ambulatory Visit: Payer: Self-pay

## 2012-01-08 ENCOUNTER — Emergency Department (HOSPITAL_COMMUNITY)
Admission: EM | Admit: 2012-01-08 | Discharge: 2012-01-08 | Disposition: A | Discharge status: Home / Self Care | Payer: Medicare Other | Attending: Emergency Medicine | Admitting: Emergency Medicine

## 2012-01-08 ENCOUNTER — Encounter (HOSPITAL_COMMUNITY): Payer: Self-pay | Admitting: Emergency Medicine

## 2012-01-08 DIAGNOSIS — Z7982 Long term (current) use of aspirin: Secondary | ICD-10-CM | POA: Insufficient documentation

## 2012-01-08 DIAGNOSIS — E119 Type 2 diabetes mellitus without complications: Secondary | ICD-10-CM | POA: Insufficient documentation

## 2012-01-08 DIAGNOSIS — Z853 Personal history of malignant neoplasm of breast: Secondary | ICD-10-CM | POA: Insufficient documentation

## 2012-01-08 DIAGNOSIS — R0789 Other chest pain: Secondary | ICD-10-CM

## 2012-01-08 DIAGNOSIS — I10 Essential (primary) hypertension: Secondary | ICD-10-CM | POA: Insufficient documentation

## 2012-01-08 DIAGNOSIS — R071 Chest pain on breathing: Secondary | ICD-10-CM | POA: Insufficient documentation

## 2012-01-08 DIAGNOSIS — M129 Arthropathy, unspecified: Secondary | ICD-10-CM | POA: Insufficient documentation

## 2012-01-08 LAB — BASIC METABOLIC PANEL
BUN: 13 mg/dL (ref 6–23)
Chloride: 102 mEq/L (ref 96–112)
Creatinine, Ser: 0.68 mg/dL (ref 0.50–1.10)
GFR calc Af Amer: >90 mL/min (ref >90)
GFR calc non Af Amer: 89 mL/min — ABNORMAL LOW (ref >90)
Potassium: 3.5 mEq/L (ref 3.5–5.1)

## 2012-01-08 LAB — CBC
HCT: 37.5 % (ref 36.0–46.0)
MCHC: 32.5 g/dL (ref 30.0–36.0)
Platelets: 382 10*3/uL (ref 150–400)
RDW: 15.7 % — ABNORMAL HIGH (ref 11.5–15.5)
WBC: 12.4 10*3/uL — ABNORMAL HIGH (ref 4.0–10.5)

## 2012-01-08 LAB — POCT I-STAT TROPONIN I: Troponin i, poc: 0 ng/mL (ref 0.00–0.08)

## 2012-01-08 LAB — D-DIMER, QUANTITATIVE: D-Dimer, Quant: 1.48 ug/mL-FEU — ABNORMAL HIGH (ref 0.00–0.48)

## 2012-01-08 MED ORDER — IOHEXOL 350 MG/ML SOLN
100.0000 mL | Freq: Once | INTRAVENOUS | Status: AC | PRN
  Administered 2012-01-08: 100 mL via INTRAVENOUS

## 2012-01-08 NOTE — ED Provider Notes (Signed)
History   This chart was scribed for Bethany Quarry, MD by Clarita Crane. The patient was seen in room APA11/APA11. Patient's care was started at 1209.    CSN: [id number]  Arrival date & time 01/08/12  1209   First MD Initiated Contact with Patient 01/08/12 1244      Chief Complaint  Patient presents with  . Chest Pain    (Consider location/radiation/quality/duration/timing/severity/associated sxs/prior treatment) HPI ALMIRA PHETTEPLACE is a 66 y.o. female who presents to the Emergency Department complaining of waxing and waning moderate chest pain described as soreness and localized to left side of chest onset yesterday and persistent since. Patient notes chest pain is aggravated with breathing. Denies cough, SOB, abdominal pain, diaphoresis, nausea, vomiting. Patient notes she was admitted to hospital 2 months ago for pneumonia. Patient with h/o diabetes, HTN, arthritis. Denies h/o DVT, PE. Denies family history of heart problems.   Past Medical History  Diagnosis Date  . Diabetes mellitus   . Hypertension   . Arthritis   . Cancer     Past Surgical History  Procedure Date  . Abdominal hysterectomy   . Cholecystectomy   . Breast surgery     No family history on file.  History  Substance Use Topics  . Smoking status: Never Smoker   . Smokeless tobacco: Not on file  . Alcohol Use: No    OB History    Grav Para Term Preterm Abortions TAB SAB Ect Mult Living                  Review of Systems A complete 10 system review of systems was obtained and all systems are negative except as noted in the HPI and PMH.   Allergies  Penicillins  Home Medications   Current Outpatient Rx  Name Route Sig Dispense Refill  . ACETAMINOPHEN 500 MG PO TABS Oral Take 500 mg by mouth every 6 (six) hours as needed. Pain    . ASPIRIN 81 MG PO CHEW Oral Chew 1 tablet (81 mg total) by mouth daily. 30 tablet 12  . VITAMIN D PO Oral Take 1 tablet by mouth 2 (two) times daily.    Marland Kitchen  GLIMEPIRIDE 1 MG PO TABS Oral Take 1 mg by mouth every other day.     . ADULT MULTIVITAMIN W/MINERALS CH Oral Take 1 tablet by mouth daily.    Marland Kitchen PRAVASTATIN SODIUM 20 MG PO TABS Oral Take 20 mg by mouth daily.    . TRAMADOL HCL 50 MG PO TABS Oral Take 50 mg by mouth 3 (three) times daily.    Marland Kitchen VITAMIN E PO Oral Take 1 tablet by mouth daily.    Marland Kitchen LOSARTAN POTASSIUM-HCTZ 100-25 MG PO TABS Oral Take 1 tablet by mouth daily.      BP 127/56  Pulse 74  Temp 98.2 F (36.8 C)  Resp 23  Ht 5' 0.5" (1.537 m)  Wt 162 lb (73.483 kg)  BMI 31.12 kg/m2  SpO2 97%  Physical Exam  Nursing note and vitals reviewed. Constitutional: She is oriented to person, place, and time. She appears well-developed and well-nourished. No distress.  HENT:  Head: Normocephalic and atraumatic.  Eyes: EOM are normal. Pupils are equal, round, and reactive to light.  Neck: Neck supple. No tracheal deviation present.  Cardiovascular: Normal rate and regular rhythm.  Exam reveals no friction rub.   No murmur heard. Pulmonary/Chest: Effort normal. No respiratory distress. She has no wheezes. She has no rales. She exhibits  tenderness.  Abdominal: Soft. She exhibits no distension.  Musculoskeletal: Normal range of motion. She exhibits no edema.  Neurological: She is alert and oriented to person, place, and time. No sensory deficit.  Skin: Skin is warm and dry.  Psychiatric: She has a normal mood and affect. Her behavior is normal.    ED Course  Procedures (including critical care time)  DIAGNOSTIC STUDIES: Oxygen Saturation is 96% on room air, adequate by my interpretation.    COORDINATION OF CARE: 2:03PM- Current labs and imaging reviewed with patient at bedside. Patient informed of current plan for treatment and evaluation and agrees with plan at this time.     Labs Reviewed  CBC - Abnormal; Notable for the following:    WBC 12.4 (*)    RDW 15.7 (*)    All other components within normal limits  BASIC  METABOLIC PANEL - Abnormal; Notable for the following:    Glucose, Bld 167 (*)    GFR calc non Af Amer 89 (*)    All other components within normal limits  D-DIMER, QUANTITATIVE - Abnormal; Notable for the following:    D-Dimer, Quant 1.48 (*)    All other components within normal limits  POCT I-STAT TROPONIN I   Chest Portable 1 View  01/08/2012  *RADIOLOGY REPORT*  Clinical Data: Chest pain.  PORTABLE CHEST - 1 VIEW  Comparison: Two-view chest 11/15/2011.  Findings: The heart size is normal.  The lung volumes are low. Mild bibasilar atelectasis is present.  No other significant airspace disease is evident.  The visualized soft tissues and bony thorax are unremarkable.  IMPRESSION:  1.  Low lung volumes and mild bibasilar atelectasis.  Original Report Authenticated By: Jamesetta Orleans. MATTERN, M.D.     No diagnosis found.    MDM   Date: 01/08/2012  Rate:79  Rhythm: normal sinus rhythm  QRS Axis: normal  Intervals: normal  ST/T Wave abnormalities:nonspecific t wave abnormalitiesl  Conduction Disutrbances: none  Narrative Interpretation: unremarkable   No change from prior ekg   Patient with some chest wall tenderness on palpation reproducing pain. Her she does also have a pleuritic component to this and has an elevated d-dimer. She will have a CT angiogram or chest tightness if this is normal may be discharged home to follow up with her primary care doctor. Patient's care is discussed with Dr. Doylene Canard.    Bethany Quarry, MD 01/08/12 [phone]

## 2012-01-08 NOTE — ED Notes (Signed)
Pt c/o cp-pressure/soreness since last night.

## 2012-01-08 NOTE — Discharge Instructions (Signed)
Document Released: 07/04/2005 Document Revised: 09/13/2011 Document Reviewed: 04/29/2008 Chest Wall Pain Chest wall pain is pain in or around the bones and muscles of your chest. It may take up to 6 weeks to get better. It may take longer if you must stay physically active in your work and activities.  CAUSES  Chest wall pain may happen on its own. However, it may be caused by: A viral illness like the flu.  Injury.  Coughing.  Exercise.  Arthritis.  Fibromyalgia.  Shingles.  HOME CARE INSTRUCTIONS  Avoid overtiring physical activity. Try not to strain or perform activities that cause pain. This includes any activities using your chest or your abdominal and side muscles, especially if heavy weights are used.  Put ice on the sore area.  Put ice in a plastic bag.  Place a towel between your skin and the bag.  Leave the ice on for 15 to 20 minutes per hour while awake for the first 2 days.  Only take over-the-counter or prescription medicines for pain, discomfort, or fever as directed by your caregiver.  SEEK IMMEDIATE MEDICAL CARE IF:  Your pain increases, or you are very uncomfortable.  You have a fever.  Your chest pain becomes worse.  You have new, unexplained symptoms.  You have nausea or vomiting.  You feel sweaty or lightheaded.  You have a cough with phlegm (sputum), or you cough up blood.  MAKE SURE YOU:  Understand these instructions.  Will watch your condition.  Will get help right away if you are not doing well or get worse.  Document Released: 09/24/2005 Document Revised: 09/13/2011 Document Reviewed: 05/21/2011 [facility] Patient Information [address] [employer]2012 [place], [place].

## 2012-04-29 ENCOUNTER — Other Ambulatory Visit (HOSPITAL_COMMUNITY): Payer: Self-pay | Admitting: Internal Medicine

## 2012-04-29 DIAGNOSIS — R6889 Other general symptoms and signs: Secondary | ICD-10-CM

## 2012-05-01 ENCOUNTER — Ambulatory Visit (HOSPITAL_COMMUNITY)
Admission: RE | Admit: 2012-05-01 | Discharge: 2012-05-01 | Disposition: A | Discharge status: Home / Self Care | Payer: Medicare Other | Source: Ambulatory Visit | Attending: Internal Medicine | Admitting: Internal Medicine

## 2012-05-01 DIAGNOSIS — R0602 Shortness of breath: Secondary | ICD-10-CM | POA: Insufficient documentation

## 2012-05-01 DIAGNOSIS — R6889 Other general symptoms and signs: Secondary | ICD-10-CM

## 2012-05-01 DIAGNOSIS — R918 Other nonspecific abnormal finding of lung field: Secondary | ICD-10-CM | POA: Insufficient documentation

## 2012-05-01 LAB — POCT I-STAT, CHEM 8
BUN: 11 mg/dL (ref 6–23)
Calcium, Ion: 1.15 mmol/L (ref 1.13–1.30)
Creatinine, Ser: 0.8 mg/dL (ref 0.50–1.10)
Hemoglobin: 12.2 g/dL (ref 12.0–15.0)
TCO2: 20 mmol/L (ref 0–100)

## 2012-05-01 MED ORDER — IOHEXOL 300 MG/ML  SOLN
80.0000 mL | Freq: Once | INTRAMUSCULAR | Status: AC | PRN
  Administered 2012-05-01: 80 mL via INTRAVENOUS

## 2012-05-01 NOTE — Progress Notes (Signed)
Venipuncture performed in left antecubital for Creatnine level  

## 2012-08-04 ENCOUNTER — Other Ambulatory Visit (HOSPITAL_COMMUNITY): Payer: Self-pay | Admitting: Internal Medicine

## 2012-08-04 ENCOUNTER — Ambulatory Visit (HOSPITAL_COMMUNITY)
Admission: RE | Admit: 2012-08-04 | Discharge: 2012-08-04 | Disposition: A | Discharge status: Home / Self Care | Payer: Medicare Other | Source: Ambulatory Visit | Attending: Internal Medicine | Admitting: Internal Medicine

## 2012-08-04 DIAGNOSIS — C50919 Malignant neoplasm of unspecified site of unspecified female breast: Secondary | ICD-10-CM | POA: Insufficient documentation

## 2012-08-04 DIAGNOSIS — R05 Cough: Secondary | ICD-10-CM | POA: Insufficient documentation

## 2012-08-04 DIAGNOSIS — R059 Cough, unspecified: Secondary | ICD-10-CM

## 2012-09-04 ENCOUNTER — Other Ambulatory Visit: Payer: Medicare Other | Admitting: Lab

## 2012-09-04 ENCOUNTER — Ambulatory Visit: Payer: Medicare Other | Admitting: Oncology

## 2012-09-10 ENCOUNTER — Telehealth: Payer: Self-pay | Admitting: *Deleted

## 2012-09-10 ENCOUNTER — Other Ambulatory Visit (HOSPITAL_BASED_OUTPATIENT_CLINIC_OR_DEPARTMENT_OTHER): Payer: Medicare Other | Admitting: Lab

## 2012-09-10 ENCOUNTER — Ambulatory Visit (HOSPITAL_BASED_OUTPATIENT_CLINIC_OR_DEPARTMENT_OTHER): Payer: Medicare Other | Admitting: Oncology

## 2012-09-10 VITALS — BP 167/81 | HR 84 | Temp 98.8°F | Resp 20 | Ht 60.5 in | Wt 170.0 lb

## 2012-09-10 DIAGNOSIS — C50219 Malignant neoplasm of upper-inner quadrant of unspecified female breast: Secondary | ICD-10-CM

## 2012-09-10 DIAGNOSIS — E559 Vitamin D deficiency, unspecified: Secondary | ICD-10-CM

## 2012-09-10 DIAGNOSIS — E119 Type 2 diabetes mellitus without complications: Secondary | ICD-10-CM

## 2012-09-10 DIAGNOSIS — I10 Essential (primary) hypertension: Secondary | ICD-10-CM

## 2012-09-10 DIAGNOSIS — Z853 Personal history of malignant neoplasm of breast: Secondary | ICD-10-CM

## 2012-09-10 DIAGNOSIS — C50919 Malignant neoplasm of unspecified site of unspecified female breast: Secondary | ICD-10-CM

## 2012-09-10 DIAGNOSIS — Z1231 Encounter for screening mammogram for malignant neoplasm of breast: Secondary | ICD-10-CM

## 2012-09-10 LAB — CBC WITH DIFFERENTIAL/PLATELET
BASO%: 0.6 % (ref 0.0–2.0)
EOS%: 1.9 % (ref 0.0–7.0)
LYMPH%: 15.1 % (ref 14.0–49.7)
MCHC: 32.6 g/dL (ref 31.5–36.0)
MCV: 83.7 fL (ref 79.5–101.0)
MONO%: 9 % (ref 0.0–14.0)
Platelets: 351 10*3/uL (ref 145–400)
RBC: 4.43 10*6/uL (ref 3.70–5.45)
RDW: 15.7 % — ABNORMAL HIGH (ref 11.2–14.5)

## 2012-09-10 LAB — COMPREHENSIVE METABOLIC PANEL (CC13)
ALT: 21 U/L (ref 0–55)
AST: 20 U/L (ref 5–34)
Alkaline Phosphatase: 103 U/L (ref 40–150)
Creatinine: 0.9 mg/dL (ref 0.6–1.1)
Total Bilirubin: 0.28 mg/dL (ref 0.20–1.20)

## 2012-09-10 NOTE — Progress Notes (Signed)
Hematology and Oncology Follow Up Visit  Bethany Ray [id number] March 12, 1946 66 y.o. 09/10/2012 4:30 PM   Principle Diagnosis: 66 year old woman with history of stage II a breast cancer, status post lumpectomy and lymph node dissection 4 cycles of TEC chemotherapy and radiation completed 2007 on adjuvant Arimidex  2. history of type 2 diabetes and hypertension  Interim History:  Bethany Ray returns for followup. She is doing reasonably well. She continues to work and also look after some of her grandchildren. She's been pretty busy with her family she continues to have some discomfort around her shoulder and the right side which is at the past also has some GI symptoms. She was hospitilized 1/13 with PNA  Overall her appetite is good weight is stable she has minimal hot flashes.  She feels better off arimidex. Her last mammogram was in 5/13 and was normal.  Medications: I have reviewed the patient's current medications.  Allergies:  Allergies  Allergen Reactions  . Penicillins Other (See Comments)    Unknown    Past Medical History, Surgical history, Social history, and Family History were reviewed and updated.  Review of Systems: Constitutional:  Negative for fever, chills, night sweats, anorexia, weight loss, pain. Cardiovascular: no chest pain or dyspnea on exertion Respiratory: no cough, shortness of breath, or wheezing Neurological: no TIA or stroke symptoms Dermatological: negative ENT: negative Skin Gastrointestinal: no abdominal pain, change in bowel habits, or black or bloody stools Genito-Urinary: no dysuria, trouble voiding, or hematuria Hematological and Lymphatic: negative Breast: negative for breast lumps Musculoskeletal: negative Remaining ROS negative.  Physical Exam: Blood pressure 167/81, pulse 84, temperature 98.8 F (37.1 C), temperature source Oral, resp. rate 20, height 5' 0.5" (1.537 m), weight 170 lb (77.111 kg). ECOG: 0 General appearance: alert,  cooperative and appears stated age Head: Normocephalic, without obvious abnormality, atraumatic Neck: no adenopathy, no carotid bruit, no JVD, supple, symmetrical, trachea midline and thyroid not enlarged, symmetric, no tenderness/mass/nodules Lymph nodes: Cervical, supraclavicular, and axillary nodes normal. Cardiac : Normal heart sounds Pulmonary: Normal breath sounds, no rales or rhonchi Breasts: Right and left breasts are free of any masses left breast status post lumpectomy, no evidence of local recurrence. Both axilla palpable negative. Abdomen: Soft nontender, no organomegaly or masses Extremities normal Neuro: Normal  Lab Results: Lab Results  Component Value Date   WBC 10.2 09/10/2012   HGB 12.1 09/10/2012   HCT 37.1 09/10/2012   MCV 83.7 09/10/2012   PLT 351 09/10/2012     Chemistry      Component Value Date/Time   NA 139 05/01/2012 1110   K 4.5 05/01/2012 1110   CL 110 05/01/2012 1110   CO2 25 01/08/2012 1314   BUN 11 05/01/2012 1110   CREATININE 0.80 05/01/2012 1110   GLU 181* 04/26/2006 1212      Component Value Date/Time   CALCIUM 9.8 01/08/2012 1314   ALKPHOS 111 09/05/2011 1419   ALKPHOS 111 09/05/2011 1419   ALKPHOS 111 09/05/2011 1419   AST 16 09/05/2011 1419   AST 16 09/05/2011 1419   AST 16 09/05/2011 1419   ALT 17 09/05/2011 1419   ALT 17 09/05/2011 1419   ALT 17 09/05/2011 1419   BILITOT 0.3 09/05/2011 1419   BILITOT 0.3 09/05/2011 1419   BILITOT 0.3 09/05/2011 1419       Radiological Studies: chest X-ray n/a Mammogram Due 5/14 Bone density pending  Impression and Plan: As a postmenopausal woman status post treatment for stage 2 breast ca,  S/p  completion 5 y of arimidex.    Overall she is doing well. I will see her in 1 year with appropriate imaging studies, we discussed exercise and weight loss More than 50% of the visit was spent in patient-related counselling   Pierce Crane, MD 12/4/20134:30 PM

## 2012-09-10 NOTE — Telephone Encounter (Signed)
Gave patient appointment for 09-10-2013 starting at 3:30pm

## 2012-09-11 LAB — VITAMIN D 25 HYDROXY (VIT D DEFICIENCY, FRACTURES): Vit D, 25-Hydroxy: 59 ng/mL (ref 30–89)

## 2012-11-04 ENCOUNTER — Other Ambulatory Visit (HOSPITAL_COMMUNITY): Payer: Self-pay | Admitting: Internal Medicine

## 2012-11-04 DIAGNOSIS — R911 Solitary pulmonary nodule: Secondary | ICD-10-CM

## 2012-11-06 ENCOUNTER — Ambulatory Visit (HOSPITAL_COMMUNITY)
Admission: RE | Admit: 2012-11-06 | Discharge: 2012-11-06 | Disposition: A | Discharge status: Home / Self Care | Payer: Medicare Other | Source: Ambulatory Visit | Attending: Internal Medicine | Admitting: Internal Medicine

## 2012-11-06 DIAGNOSIS — E049 Nontoxic goiter, unspecified: Secondary | ICD-10-CM | POA: Insufficient documentation

## 2012-11-06 DIAGNOSIS — Z853 Personal history of malignant neoplasm of breast: Secondary | ICD-10-CM | POA: Insufficient documentation

## 2012-11-06 DIAGNOSIS — J984 Other disorders of lung: Secondary | ICD-10-CM | POA: Insufficient documentation

## 2012-11-06 DIAGNOSIS — R911 Solitary pulmonary nodule: Secondary | ICD-10-CM

## 2012-11-06 LAB — POCT I-STAT, CHEM 8
BUN: 9 mg/dL (ref 6–23)
Creatinine, Ser: 0.7 mg/dL (ref 0.50–1.10)
Potassium: 4.1 mEq/L (ref 3.5–5.1)
Sodium: 138 mEq/L (ref 135–145)

## 2012-11-06 MED ORDER — IOHEXOL 300 MG/ML  SOLN
100.0000 mL | Freq: Once | INTRAMUSCULAR | Status: AC | PRN
  Administered 2012-11-06: 80 mL via INTRAVENOUS

## 2012-11-06 NOTE — Progress Notes (Signed)
Blood sample obtained from right arm IV for Creatnine level.  

## 2013-03-09 ENCOUNTER — Encounter: Payer: Self-pay | Admitting: Oncology

## 2013-05-06 ENCOUNTER — Telehealth: Payer: Self-pay | Admitting: *Deleted

## 2013-05-06 NOTE — Telephone Encounter (Signed)
Unable to reach pt.  Both numbers in system are non working.  Will send pt letter in mail.

## 2013-05-08 ENCOUNTER — Encounter: Payer: Self-pay | Admitting: *Deleted

## 2013-05-08 NOTE — Progress Notes (Signed)
Mailed letter to request pt to call and schedule appt with new provider. 

## 2013-09-10 ENCOUNTER — Other Ambulatory Visit: Payer: Self-pay | Admitting: *Deleted

## 2013-09-10 ENCOUNTER — Ambulatory Visit: Payer: Medicare Other | Admitting: Oncology

## 2013-09-10 ENCOUNTER — Encounter: Payer: Self-pay | Admitting: *Deleted

## 2013-09-10 ENCOUNTER — Other Ambulatory Visit: Payer: Medicare Other | Admitting: Lab

## 2013-09-10 ENCOUNTER — Ambulatory Visit (HOSPITAL_BASED_OUTPATIENT_CLINIC_OR_DEPARTMENT_OTHER): Payer: Medicare Other | Admitting: Lab

## 2013-09-10 DIAGNOSIS — C50919 Malignant neoplasm of unspecified site of unspecified female breast: Secondary | ICD-10-CM

## 2013-09-10 DIAGNOSIS — Z853 Personal history of malignant neoplasm of breast: Secondary | ICD-10-CM

## 2013-09-10 LAB — COMPREHENSIVE METABOLIC PANEL (CC13)
Albumin: 3.5 g/dL (ref 3.5–5.0)
Alkaline Phosphatase: 101 U/L (ref 40–150)
Anion Gap: 11 mEq/L (ref 3–11)
BUN: 13.2 mg/dL (ref 7.0–26.0)
CO2: 25 mEq/L (ref 22–29)
Calcium: 10 mg/dL (ref 8.4–10.4)
Glucose: 87 mg/dl (ref 70–140)
Potassium: 4.1 mEq/L (ref 3.5–5.1)

## 2013-09-10 LAB — CBC WITH DIFFERENTIAL/PLATELET
Basophils Absolute: 0 10*3/uL (ref 0.0–0.1)
Eosinophils Absolute: 0.2 10*3/uL (ref 0.0–0.5)
HGB: 11.7 g/dL (ref 11.6–15.9)
MCV: 85.1 fL (ref 79.5–101.0)
MONO%: 10.8 % (ref 0.0–14.0)
NEUT#: 5.7 10*3/uL (ref 1.5–6.5)
Platelets: 359 10*3/uL (ref 145–400)
RDW: 16.2 % — ABNORMAL HIGH (ref 11.2–14.5)

## 2013-09-10 NOTE — Progress Notes (Signed)
Pt came to cancer center today for appt with Dr. Donnie Coffin.  Pt was informed that Dr. Donnie Coffin was no longer at this practice.  Gave pt a new appt with Dr. Welton Flakes on 09/14/13 at 1:30.  Pt had lab work drawn today.  Gave pt contact information if appt does not work well with her schedule.

## 2013-09-14 ENCOUNTER — Ambulatory Visit (HOSPITAL_BASED_OUTPATIENT_CLINIC_OR_DEPARTMENT_OTHER): Payer: Medicare Other | Admitting: Oncology

## 2013-09-14 ENCOUNTER — Telehealth: Payer: Self-pay | Admitting: *Deleted

## 2013-09-14 ENCOUNTER — Encounter: Payer: Self-pay | Admitting: Oncology

## 2013-09-14 VITALS — BP 144/80 | HR 85 | Temp 98.6°F | Resp 18 | Ht 60.25 in | Wt 170.6 lb

## 2013-09-14 DIAGNOSIS — Z853 Personal history of malignant neoplasm of breast: Secondary | ICD-10-CM

## 2013-09-14 DIAGNOSIS — C50919 Malignant neoplasm of unspecified site of unspecified female breast: Secondary | ICD-10-CM

## 2013-09-14 DIAGNOSIS — C50311 Malignant neoplasm of lower-inner quadrant of right female breast: Secondary | ICD-10-CM | POA: Insufficient documentation

## 2013-09-14 HISTORY — DX: Malignant neoplasm of unspecified site of unspecified female breast: C50.919

## 2013-09-14 NOTE — Telephone Encounter (Signed)
appts made and printed...td 

## 2013-09-14 NOTE — Progress Notes (Signed)
OFFICE PROGRESS NOTE  CCCarylon Perches, MD [address] [place] [address]  DIAGNOSIS: 67 year old female with history of stage II right breast cancer diagnosed 2007  PRIOR THERAPY:  #1 patient is status post lumpectomy with sentinel lymph node biopsy in June 2007. 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  #3 patient received 4 cycles of TEC followed by radiation therapy. All of her therapy was completed in 2007.  #4 patient then went on to receive adjuvant antiestrogen therapy with 5 years of arimidex. This was completed 2012.   CURRENT THERAPY: observation   INTERVAL HISTORY: Bethany Ray 67 y.o. female returns for followup visit to establish her oncologic care. Previously she was seeing Dr. Pierce Crane. He is the one who treated her. Overall she feels well. Her last visit with Dr. Donnie Coffin was December 2013. Patient is a little tired. She otherwise denies any headaches double vision blurring of vision fevers chills night sweats. She does have some bilateral knee pain. She apparently has had fusions that have been drained and she does receive some what sounds like steroid injections. She is up-to-date on her mammograms. Remainder of the 10 point review of systems is negative.  MEDICAL HISTORY: Past Medical History  Diagnosis Date  . Diabetes mellitus   . Hypertension   . Arthritis   . Cancer approx 2006    rt breast/lumpectomy/chemo/rad tx  . Breast cancer 09/14/2013    ALLERGIES:  is allergic to penicillins.  MEDICATIONS:  Current Outpatient Prescriptions  Medication Sig Dispense Refill  . acetaminophen (TYLENOL) 500 MG tablet Take 500 mg by mouth every 6 (six) hours as needed. Pain      . amLODipine (NORVASC) 5 MG tablet Take 5 mg by mouth daily.      . Cholecalciferol (VITAMIN D PO) Take 1 tablet by mouth 2 (two)  times daily.      Marland Kitchen glimepiride (AMARYL) 1 MG tablet Take 1 mg by mouth every other day.       . leflunomide (ARAVA) 10 MG tablet Take 10 mg by mouth daily.      Marland Kitchen losartan-hydrochlorothiazide (HYZAAR) 100-25 MG per tablet Take 1 tablet by mouth daily.      . metFORMIN (GLUCOPHAGE-XR) 500 MG 24 hr tablet Take 1,000 mg by mouth daily.       . Multiple Vitamin (MULITIVITAMIN WITH MINERALS) TABS Take 1 tablet by mouth daily.      . pravastatin (PRAVACHOL) 20 MG tablet Take 20 mg by mouth daily.      . traMADol (ULTRAM) 50 MG tablet Take 50 mg by mouth 3 (three) times daily.      Marland Kitchen VITAMIN E PO Take 1 tablet by mouth daily.      . benzonatate (TESSALON) 200 MG capsule Take 200 mg by mouth 3 (three) times daily as needed.       No current facility-administered medications for this visit.    SURGICAL HISTORY:  Past Surgical History  Procedure Laterality Date  . Abdominal hysterectomy    . Cholecystectomy    . Breast surgery      REVIEW OF SYSTEMS:  Pertinent items are noted in HPI.   HEALTH MAINTENANCE:  PHYSICAL EXAMINATION: Blood pressure 144/80, pulse 85, temperature 98.6 F (37 C), temperature source Oral, resp. rate 18, height 5' 0.25" (1.53 m), weight 170 lb 9 oz (  77.367 kg). Body mass index is 33.05 kg/(m^2). ECOG PERFORMANCE STATUS: 0 - Asymptomatic   General appearance: alert, cooperative and appears stated age Lymph nodes: Cervical, supraclavicular, and axillary nodes normal. Resp: clear to auscultation bilaterally Back: symmetric, no curvature. ROM normal. No CVA tenderness. Cardio: regular rate and rhythm GI: soft, non-tender; bowel sounds normal; no masses,  no organomegaly Extremities: extremities normal, atraumatic, no cyanosis or edema Neurologic: Grossly normal Bilateral breast examination: Right breast reveals well-healed surgical scar no evidence of local recurrence no nipple discharge no dominant masses. Left breast no masses nipple discharge or skin  changes.  LABORATORY DATA: Lab Results  Component Value Date   WBC 8.8 09/10/2013   HGB 11.7 09/10/2013   HCT 36.9 09/10/2013   MCV 85.1 09/10/2013   PLT 359 09/10/2013      Chemistry      Component Value Date/Time   NA 142 09/10/2013 1531   NA 138 11/06/2012 1022   K 4.1 09/10/2013 1531   K 4.1 11/06/2012 1022   CL 112 11/06/2012 1022   CL 104 09/10/2012 1552   CO2 25 09/10/2013 1531   CO2 25 01/08/2012 1314   BUN 13.2 09/10/2013 1531   BUN 9 11/06/2012 1022   CREATININE 0.8 09/10/2013 1531   CREATININE 0.70 11/06/2012 1022   GLU 181* 04/26/2006 1212      Component Value Date/Time   CALCIUM 10.0 09/10/2013 1531   CALCIUM 9.8 01/08/2012 1314   ALKPHOS 101 09/10/2013 1531   ALKPHOS 111 09/05/2011 1419   AST 20 09/10/2013 1531   AST 16 09/05/2011 1419   ALT 22 09/10/2013 1531   ALT 17 09/05/2011 1419   BILITOT 0.22 09/10/2013 1531   BILITOT 0.3 09/05/2011 1419     FINAL DIAGNOSIS  MICROSCOPIC EXAMINATION AND DIAGNOSIS  1. LYMPH NODE, RIGHT AXILLARY SENTINEL, BIOPSY: ONE LYMPH NODE NEGATIVE FOR TUMOR.  2. LYMPH NODE, RIGHT AXILLARY SENTINEL, BIOPSY: ONE LYMPH NODE NEGATIVE FOR TUMOR.  3. LYMPH NODE, RIGHT AXILLARY SENTINEL, BIOPSY: ONE LYMPH NODE NEGATIVE FOR TUMOR.  4. BREAST, WIRE/NEEDLE LOCALIZED LUMPECTOMY, RIGHT: - INVASIVE DUCTAL CARCINOMA, 2.1 CM, MSBR III OF III. - MARGINS NEGATIVE WITH THE NEAREST MARGIN THE POSTERIOR MARGIN AT 0.7 CM. - FIBROCYSTIC CHANGE.  COMMENT ONCOLOGY TABLE-BREAST, INCISIONAL/EXCISIONAL BIOPSY OR MASTECTOMY WITH LYMPH NODES  1. Maximum tumor size (cm): 2.1 cm 2. Margins: Negative Invasive component, distance to closest margin: 0.7 cm posterior In situ component, distance to closest margin: N/A 3. Vascular/Lymphatic invasion: None identified 4. Histology, invasive component: Invasive ductal carcinoma 5. Grade, invasive component (Elston-Ellis modified Scarff-Bloom-Richardson): III of III Tubule formation grade: 3 Nuclear pleomorphism  grade: 2 Mitotic grade: 3 6. Extensive intraductal component (JCRT): N/A 7. Histology, in situ component: N/A 8. Grade, in situ component: N/A 9. Multicentric (separate tumors in different quadrants): Unknown 10. Multifocal (separate tumors in same quadrant or biopsy): No 11. Axillary lymph nodes: #examined: 3 #with metastasis: 0 ITC (isolated tumor cells, < 0.33mm): 0 Micrometastasis (> 0.54mm, < 2mm): 0 Metastasis > 2 mm: 0 Extracapsular extension: N/A 12. TNM Code: pT2, pN0(i-)(sn), pMX 13. Breast Prognostic Markers: Case number [id number] Estrogen receptor 0% negative Progesterone receptor 7% positive Ki 67 (Mib-1) 69% Her 2 neu (HercepTest) 1+ Her 2 neu by FISH no amplification; polysome present; ratio of Her 2 neu gene signals to CEP 17 signals is 1.0 14. Non-neoplastic breast: Fibrocystic change 15. Comments: The intraoperative consult diagnoses are confirmed. The sentinel lymph nodes in Parts 1, 2 and 3 are negative for  tumor. Immunohistochemical stains for Cytokeratin AE1/AE3 performed on one block from Part 1, one block from Part 2, and one block from Part 3 are negative for tumor. All controls stained appropriately.      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 She underwent a lumpectomy with sentinel lymph node in June 2007 followed by adjuvant chemotherapy consisting of 4 cycles of TEC per Dr. Donnie Coffin nodes. She then went on to receive adjuvant radiation therapy followed by 5 years of Arimidex. She completed all of her therapy in 2012. She has no evidence of recurrent disease. She will continue to be followed on a yearly basis. Her mammograms are all up to date. We discussed exercise eating healthy. She will continue to be seen by Dr. Carylon Perches for her ongoing medical care.   PLAN:   #1 patient will continue annual mammograms.  #2 I will see her back  in one year time   All questions were answered. The patient knows to call the clinic with any problems, questions or concerns. We can certainly see the patient much sooner if necessary.  I spent 20 minutes counseling the patient face to face. The total time spent in the appointment was 30 minutes.    Drue Second, MD Medical/Oncology [facility] [phone] (beeper) [phone] (Office)  09/14/2013, 1:53 PM

## 2013-12-19 ENCOUNTER — Emergency Department (HOSPITAL_COMMUNITY): Payer: Medicare HMO

## 2013-12-19 ENCOUNTER — Encounter (HOSPITAL_COMMUNITY): Payer: Self-pay | Admitting: Emergency Medicine

## 2013-12-19 ENCOUNTER — Emergency Department (HOSPITAL_COMMUNITY)
Admission: EM | Admit: 2013-12-19 | Discharge: 2013-12-20 | Disposition: A | Discharge status: Home / Self Care | Payer: Medicare HMO | Attending: Emergency Medicine | Admitting: Emergency Medicine

## 2013-12-19 DIAGNOSIS — Z880 Allergy status to penicillin: Secondary | ICD-10-CM | POA: Insufficient documentation

## 2013-12-19 DIAGNOSIS — Z9089 Acquired absence of other organs: Secondary | ICD-10-CM | POA: Insufficient documentation

## 2013-12-19 DIAGNOSIS — M549 Dorsalgia, unspecified: Secondary | ICD-10-CM | POA: Insufficient documentation

## 2013-12-19 DIAGNOSIS — I10 Essential (primary) hypertension: Secondary | ICD-10-CM | POA: Insufficient documentation

## 2013-12-19 DIAGNOSIS — Z90710 Acquired absence of both cervix and uterus: Secondary | ICD-10-CM | POA: Insufficient documentation

## 2013-12-19 DIAGNOSIS — R300 Dysuria: Secondary | ICD-10-CM | POA: Insufficient documentation

## 2013-12-19 DIAGNOSIS — Z8619 Personal history of other infectious and parasitic diseases: Secondary | ICD-10-CM | POA: Insufficient documentation

## 2013-12-19 DIAGNOSIS — R109 Unspecified abdominal pain: Secondary | ICD-10-CM

## 2013-12-19 DIAGNOSIS — M129 Arthropathy, unspecified: Secondary | ICD-10-CM | POA: Insufficient documentation

## 2013-12-19 DIAGNOSIS — Z79899 Other long term (current) drug therapy: Secondary | ICD-10-CM | POA: Insufficient documentation

## 2013-12-19 DIAGNOSIS — Z853 Personal history of malignant neoplasm of breast: Secondary | ICD-10-CM | POA: Insufficient documentation

## 2013-12-19 DIAGNOSIS — E119 Type 2 diabetes mellitus without complications: Secondary | ICD-10-CM | POA: Insufficient documentation

## 2013-12-19 HISTORY — DX: Zoster without complications: B02.9

## 2013-12-19 LAB — CBC WITH DIFFERENTIAL/PLATELET
Basophils Absolute: 0 10*3/uL (ref 0.0–0.1)
Basophils Relative: 0 % (ref 0–1)
Eosinophils Absolute: 0.2 10*3/uL (ref 0.0–0.7)
Eosinophils Relative: 2 % (ref 0–5)
HCT: 32.8 % — ABNORMAL LOW (ref 36.0–46.0)
Hemoglobin: 10.8 g/dL — ABNORMAL LOW (ref 12.0–15.0)
Lymphocytes Relative: 18 % (ref 12–46)
Lymphs Abs: 1.8 10*3/uL (ref 0.7–4.0)
MCH: 27.5 pg (ref 26.0–34.0)
MCHC: 32.9 g/dL (ref 30.0–36.0)
MCV: 83.5 fL (ref 78.0–100.0)
Monocytes Absolute: 1 10*3/uL (ref 0.1–1.0)
Monocytes Relative: 10 % (ref 3–12)
Neutro Abs: 6.7 10*3/uL (ref 1.7–7.7)
Neutrophils Relative %: 69 % (ref 43–77)
Platelets: 364 10*3/uL (ref 150–400)
RBC: 3.93 MIL/uL (ref 3.87–5.11)
RDW: 16.7 % — ABNORMAL HIGH (ref 11.5–15.5)
WBC: 9.7 10*3/uL (ref 4.0–10.5)

## 2013-12-19 LAB — COMPREHENSIVE METABOLIC PANEL
ALT: 22 U/L (ref 0–35)
AST: 22 U/L (ref 0–37)
Albumin: 3.3 g/dL — ABNORMAL LOW (ref 3.5–5.2)
Alkaline Phosphatase: 100 U/L (ref 39–117)
BUN: 15 mg/dL (ref 6–23)
CO2: 24 mEq/L (ref 19–32)
Calcium: 9.4 mg/dL (ref 8.4–10.5)
Chloride: 108 mEq/L (ref 96–112)
Creatinine, Ser: 0.74 mg/dL (ref 0.50–1.10)
GFR calc Af Amer: >90 mL/min (ref >90)
GFR calc non Af Amer: 85 mL/min — ABNORMAL LOW (ref >90)
Glucose, Bld: 120 mg/dL — ABNORMAL HIGH (ref 70–99)
Potassium: 3.9 mEq/L (ref 3.7–5.3)
Sodium: 143 mEq/L (ref 137–147)
Total Bilirubin: 0.2 mg/dL — ABNORMAL LOW (ref 0.3–1.2)
Total Protein: 7.5 g/dL (ref 6.0–8.3)

## 2013-12-19 LAB — URINALYSIS, ROUTINE W REFLEX MICROSCOPIC
Bilirubin Urine: NEGATIVE
Glucose, UA: NEGATIVE mg/dL
Hgb urine dipstick: NEGATIVE
Ketones, ur: NEGATIVE mg/dL
Leukocytes, UA: NEGATIVE
Nitrite: NEGATIVE
Protein, ur: NEGATIVE mg/dL
Specific Gravity, Urine: 1.02 (ref 1.005–1.030)
Urobilinogen, UA: 0.2 mg/dL (ref 0.0–1.0)
pH: 8 (ref 5.0–8.0)

## 2013-12-19 LAB — TROPONIN I: Troponin I: <0.3 ng/mL (ref <0.30)

## 2013-12-19 LAB — LIPASE, BLOOD: Lipase: 26 U/L (ref 11–59)

## 2013-12-19 MED ORDER — SODIUM CHLORIDE 0.9 % IV BOLUS (SEPSIS)
1000.0000 mL | Freq: Once | INTRAVENOUS | Status: AC
  Administered 2013-12-19: 1000 mL via INTRAVENOUS

## 2013-12-19 MED ORDER — MORPHINE SULFATE 4 MG/ML IJ SOLN
4.0000 mg | Freq: Once | INTRAMUSCULAR | Status: AC
  Administered 2013-12-19: 4 mg via INTRAVENOUS
  Filled 2013-12-19: qty 1

## 2013-12-19 MED ORDER — ONDANSETRON HCL 4 MG/2ML IJ SOLN
4.0000 mg | Freq: Once | INTRAMUSCULAR | Status: AC
  Administered 2013-12-19: 4 mg via INTRAVENOUS
  Filled 2013-12-19: qty 2

## 2013-12-19 MED ORDER — OXYCODONE-ACETAMINOPHEN 5-325 MG PO TABS: 2.0000 | ORAL_TABLET | ORAL | Status: DC | PRN

## 2013-12-19 NOTE — ED Notes (Signed)
Pain right flank to groin, denies n/v/cough or fevers

## 2013-12-19 NOTE — ED Provider Notes (Signed)
CSN: [id number]     Arrival date & time 12/19/13  1916 History   First MD Initiated Contact with Patient 12/19/13 2033     Chief Complaint  Patient presents with  . Flank Pain     (Consider location/radiation/quality/duration/timing/severity/associated sxs/prior Treatment) HPI Comments: Patient presents with a three-day history of right sided flank pain it radiates to her groin. It is constant. Associated with urinary frequency and dysuria. No hematuria. No nausea, vomiting, cough or fever. No chest pain or shortness of breath. She's had kidney stones in the past but is not certain if this feels similar. She is worried that she might have pneumonia but she has not been coughing. good by mouth intake and urine output. No change in bowel habits. Nothing makes the pain better or worse.  The history is provided by the patient.    Past Medical History  Diagnosis Date  . Diabetes mellitus   . Hypertension   . Arthritis   . Cancer approx 2006    rt breast/lumpectomy/chemo/rad tx  . Breast cancer 09/14/2013  . Shingles    Past Surgical History  Procedure Laterality Date  . Abdominal hysterectomy    . Cholecystectomy    . Breast surgery     No family history on file. History  Substance Use Topics  . Smoking status: Never Smoker   . Smokeless tobacco: Not on file  . Alcohol Use: No   OB History   Grav Para Term Preterm Abortions TAB SAB Ect Mult Living                 Review of Systems  Constitutional: Negative for fever, activity change and appetite change.  Respiratory: Negative for cough, chest tightness and shortness of breath.   Cardiovascular: Negative for chest pain.  Gastrointestinal: Negative for nausea, vomiting and abdominal pain.  Genitourinary: Positive for dysuria and flank pain. Negative for hematuria, vaginal bleeding and vaginal discharge.  Musculoskeletal: Positive for back pain. Negative for arthralgias and myalgias.  Neurological: Negative for dizziness,  weakness and headaches.  A complete 10 system review of systems was obtained and all systems are negative except as noted in the HPI and PMH.      Allergies  Penicillins  Home Medications   Current Outpatient Rx  Name  Route  Sig  Dispense  Refill  . acetaminophen (TYLENOL) 500 MG tablet   Oral   Take 500 mg by mouth every 6 (six) hours as needed. Pain         . amLODipine (NORVASC) 5 MG tablet   Oral   Take 5 mg by mouth daily.         . Cholecalciferol (VITAMIN D PO)   Oral   Take 1 tablet by mouth 2 (two) times daily.         Marland Kitchen glimepiride (AMARYL) 2 MG tablet   Oral   Take 1 mg by mouth daily with breakfast.         . leflunomide (ARAVA) 20 MG tablet   Oral   Take 20 mg by mouth daily.         Marland Kitchen losartan-hydrochlorothiazide (HYZAAR) 100-25 MG per tablet   Oral   Take 1 tablet by mouth daily.         . metFORMIN (GLUCOPHAGE-XR) 500 MG 24 hr tablet   Oral   Take 1,000 mg by mouth daily.          . Multiple Vitamin (MULITIVITAMIN WITH MINERALS) TABS   Oral  Take 1 tablet by mouth daily.         . pravastatin (PRAVACHOL) 20 MG tablet   Oral   Take 20 mg by mouth daily.         Marland Kitchen oxyCODONE-acetaminophen (PERCOCET/ROXICET) 5-325 MG per tablet   Oral   Take 2 tablets by mouth every 4 (four) hours as needed for severe pain.   15 tablet   0    BP 175/117  Pulse 72  Temp(Src) 98.6 F (37 C)  Resp 16  Ht 5' (1.524 m)  Wt 170 lb (77.111 kg)  BMI 33.20 kg/m2  SpO2 98% Physical Exam  Constitutional: She is oriented to person, place, and time. She appears well-developed and well-nourished. No distress.  HENT:  Head: Normocephalic and atraumatic.  Mouth/Throat: Oropharynx is clear and moist. No oropharyngeal exudate.  Eyes: Conjunctivae and EOM are normal. Pupils are equal, round, and reactive to light.  Neck: Normal range of motion. Neck supple.  Cardiovascular: Normal rate, regular rhythm and normal heart sounds.   Pulmonary/Chest:  Effort normal and breath sounds normal. No respiratory distress.  Abdominal: Soft. There is no tenderness. There is no rebound and no guarding.  No RUQ tenderness. Minimal lower abdominal tenderness. No pain at McBurney's point  Musculoskeletal: Normal range of motion. She exhibits tenderness. She exhibits no edema.   R CVAT 5/5 strength in bilateral lower extremities. Ankle plantar and dorsiflexion intact. Great toe extension intact bilaterally. +2 DP and PT pulses. +2 patellar reflexes bilaterally. Normal gait.   Neurological: She is alert and oriented to person, place, and time. No cranial nerve deficit. She exhibits normal muscle tone. Coordination normal.  Skin: Skin is warm. No rash noted.    ED Course  Procedures (including critical care time) Labs Review Labs Reviewed  URINALYSIS, ROUTINE W REFLEX MICROSCOPIC - Abnormal; Notable for the following:    APPearance CLOUDY (*)    All other components within normal limits  CBC WITH DIFFERENTIAL - Abnormal; Notable for the following:    Hemoglobin 10.8 (*)    HCT 32.8 (*)    RDW 16.7 (*)    All other components within normal limits  COMPREHENSIVE METABOLIC PANEL - Abnormal; Notable for the following:    Glucose, Bld 120 (*)    Albumin 3.3 (*)    Total Bilirubin 0.2 (*)    GFR calc non Af Amer 85 (*)    All other components within normal limits  LIPASE, BLOOD  TROPONIN I   Imaging Review Ct Abdomen Pelvis Wo Contrast  12/19/2013   CLINICAL DATA:  Right flank and groin pain.  EXAM: CT ABDOMEN AND PELVIS WITHOUT CONTRAST  TECHNIQUE: Multidetector CT imaging of the abdomen and pelvis was performed following the standard protocol without intravenous contrast.  COMPARISON:  Multiple previous examinations, the most recent abdomen and pelvis CT dated 11/05/2011 cyst and chest CT dated 11/06/2012.  FINDINGS: Cholecystectomy clips. Normal non contrasted appearance of the liver, spleen, pancreas, adrenal glands, kidneys and urinary bladder.  No urinary tract calculi or hydronephrosis. Surgically absent uterus. The ovaries are not visualized.  Multiple colonic diverticula without evidence of diverticulitis. Normal appearing appendix. No enlarged lymph nodes. Multiple nodules at both lung bases without significant change since 04/18/2006, compatible with a benign process. Lumbar and lower thoracic spine degenerative changes. There is a bandlike area of scarring in the left lower lobe without significant change.  IMPRESSION: 1. No acute abnormality. 2. No urinary tract calculi or hydronephrosis. 3. Stable benign nodules at  both lung bases.   Electronically Signed   By: Enrique Sack M.D.   On: 12/19/2013 22:36   Dg Chest 2 View  12/19/2013   CLINICAL DATA:  Right flank pain.  EXAM: CHEST  2 VIEW  COMPARISON:  08/04/2012 and Chest CT dated 11/06/2012.  FINDINGS: The heart remains normal in size and the lungs are clear. Thoracic spine degenerative changes. Cholecystectomy clips.  IMPRESSION: No acute abnormality.   Electronically Signed   By: Enrique Sack M.D.   On: 12/19/2013 22:18     EKG Interpretation None      MDM   Final diagnoses:  Flank pain  3 days of flank pain and dysuria and frequency. No fever, vomiting, abdominal pain, chest pain. Previous choley. Remote history of breast cancer. No tachycardia or hypoxia.  Consider UTI, kidney stone, PNA, early zoster. No peritoneal signs. Labs appear to be at baseline.CXR negative. CT shows no kidney stone, hydro or other pathology.  UA pending at time of sign out to Dr. Karle Starch.         Ezequiel Essex, MD 12/19/13 [phone]

## 2013-12-19 NOTE — Discharge Instructions (Signed)

## 2013-12-28 ENCOUNTER — Other Ambulatory Visit (HOSPITAL_COMMUNITY): Payer: Self-pay | Admitting: Internal Medicine

## 2013-12-28 ENCOUNTER — Ambulatory Visit (HOSPITAL_COMMUNITY)
Admission: RE | Admit: 2013-12-28 | Discharge: 2013-12-28 | Disposition: A | Discharge status: Home / Self Care | Payer: Medicare HMO | Source: Ambulatory Visit | Attending: Internal Medicine | Admitting: Internal Medicine

## 2013-12-28 DIAGNOSIS — M545 Low back pain, unspecified: Secondary | ICD-10-CM | POA: Insufficient documentation

## 2013-12-28 DIAGNOSIS — M549 Dorsalgia, unspecified: Secondary | ICD-10-CM

## 2013-12-28 DIAGNOSIS — M51379 Other intervertebral disc degeneration, lumbosacral region without mention of lumbar back pain or lower extremity pain: Secondary | ICD-10-CM | POA: Insufficient documentation

## 2013-12-28 DIAGNOSIS — M47817 Spondylosis without myelopathy or radiculopathy, lumbosacral region: Secondary | ICD-10-CM | POA: Insufficient documentation

## 2013-12-28 DIAGNOSIS — M5137 Other intervertebral disc degeneration, lumbosacral region: Secondary | ICD-10-CM | POA: Insufficient documentation

## 2013-12-28 DIAGNOSIS — G8929 Other chronic pain: Secondary | ICD-10-CM | POA: Insufficient documentation

## 2014-05-22 ENCOUNTER — Telehealth: Payer: Self-pay | Admitting: Hematology and Oncology

## 2014-07-11 ENCOUNTER — Emergency Department (HOSPITAL_COMMUNITY)
Admission: EM | Admit: 2014-07-11 | Discharge: 2014-07-11 | Disposition: A | Discharge status: Home / Self Care | Payer: Medicare HMO | Attending: Emergency Medicine | Admitting: Emergency Medicine

## 2014-07-11 ENCOUNTER — Encounter (HOSPITAL_COMMUNITY): Payer: Self-pay | Admitting: Emergency Medicine

## 2014-07-11 DIAGNOSIS — M1990 Unspecified osteoarthritis, unspecified site: Secondary | ICD-10-CM | POA: Insufficient documentation

## 2014-07-11 DIAGNOSIS — Z79899 Other long term (current) drug therapy: Secondary | ICD-10-CM | POA: Diagnosis not present

## 2014-07-11 DIAGNOSIS — I10 Essential (primary) hypertension: Secondary | ICD-10-CM | POA: Insufficient documentation

## 2014-07-11 DIAGNOSIS — Z880 Allergy status to penicillin: Secondary | ICD-10-CM | POA: Insufficient documentation

## 2014-07-11 DIAGNOSIS — R51 Headache: Secondary | ICD-10-CM | POA: Insufficient documentation

## 2014-07-11 DIAGNOSIS — R040 Epistaxis: Secondary | ICD-10-CM | POA: Insufficient documentation

## 2014-07-11 DIAGNOSIS — Z853 Personal history of malignant neoplasm of breast: Secondary | ICD-10-CM | POA: Diagnosis not present

## 2014-07-11 DIAGNOSIS — E119 Type 2 diabetes mellitus without complications: Secondary | ICD-10-CM | POA: Diagnosis not present

## 2014-07-11 DIAGNOSIS — Z8619 Personal history of other infectious and parasitic diseases: Secondary | ICD-10-CM | POA: Diagnosis not present

## 2014-07-11 LAB — CBC
HCT: 33.7 % — ABNORMAL LOW (ref 36.0–46.0)
Hemoglobin: 11.1 g/dL — ABNORMAL LOW (ref 12.0–15.0)
MCH: 27.1 pg (ref 26.0–34.0)
MCHC: 32.9 g/dL (ref 30.0–36.0)
MCV: 82.4 fL (ref 78.0–100.0)
PLATELETS: 345 10*3/uL (ref 150–400)
RBC: 4.09 MIL/uL (ref 3.87–5.11)
RDW: 16.9 % — ABNORMAL HIGH (ref 11.5–15.5)
WBC: 8.9 10*3/uL (ref 4.0–10.5)

## 2014-07-11 MED ORDER — OXYMETAZOLINE HCL 0.05 % NA SOLN
1.0000 | Freq: Once | NASAL | Status: AC
  Administered 2014-07-11: 1 via NASAL
  Filled 2014-07-11: qty 15

## 2014-07-11 NOTE — ED Notes (Signed)
Patient with no complaints at this time. Respirations even and unlabored. Skin warm/dry. Discharge instructions reviewed with patient at this time. Patient given opportunity to voice concerns/ask questions. Patient discharged at this time and left Emergency Department with steady gait.   

## 2014-07-11 NOTE — Discharge Instructions (Signed)
Nosebleed °A nosebleed can be caused by many things, including: °· Getting hit hard in the nose. °· Infections. °· Dry nose. °· Colds. °· Medicines. °Your doctor may do lab testing if you get nosebleeds a lot and the cause is not known. °HOME CARE  °· If your nose was packed with material, keep it there until your doctor takes it out. Put the pack back in your nose if the pack falls out. °· Do not blow your nose for 12 hours after the nosebleed. °· Sit up and bend forward if your nose starts bleeding again. Pinch the front half of your nose nonstop for 20 minutes. °· Put petroleum jelly inside your nose every morning if you have a dry nose. °· Use a humidifier to make the air less dry. °· Do not take aspirin. °· Try not to strain, lift, or bend at the waist for many days after the nosebleed. °GET HELP RIGHT AWAY IF:  °· Nosebleeds keep happening and are hard to stop or control. °· You have bleeding or bruises that are not normal on other parts of the body. °· You have a fever. °· The nosebleeds get worse. °· You get lightheaded, feel faint, sweaty, or throw up (vomit) blood. °MAKE SURE YOU:  °· Understand these instructions. °· Will watch your condition. °· Will get help right away if you are not doing well or get worse. °Document Released: 07/03/2008 Document Revised: 12/17/2011 Document Reviewed: 07/03/2008 °ExitCare® Patient Information ©2015 [employer]. This information is not intended to replace advice given to you by your health care provider. Make sure you discuss any questions you have with your health care provider. ° °

## 2014-07-11 NOTE — ED Provider Notes (Signed)
CSN: [id number]     Arrival date & time 07/11/14  1122 History  This chart was scribed for a non-physician practitioner, Hale Bogus, PA-C working with Mariea Clonts, MD by Martinique Peace, ED Scribe. The patient was seen in APFT24/APFT24. The patient's care was started at 12:15 PM.     Chief Complaint  Patient presents with  . Epistaxis      Patient is a 68 y.o. female presenting with nosebleeds. The history is provided by the patient. No language interpreter was used.  Epistaxis Severity:  Severe Duration:  6 hours Ineffective treatments:  Applying pressure Associated symptoms: headaches   HPI Comments: Bethany Ray is a 68 y.o. female who presents to the Emergency Department complaining epistaxis during the night. Pt reports bleeding started around 11PM that continued to around 5AM. Bleeding is now controlled. She reports spitting blood out this morning whenever she got up this morning after episode was over. She reports history of epistaxis but states episodes normally lasted just a few minutes. Pt also complains of slight frontal headache due to the fact that she wasn't able get much sleep last night. She states the headache is mild and was gradual in onset this morning.  She denies being on any blood thinners but is on medication for her BP. Pt further denies any focal or generalized weakness, visual changes, dizziness.  Pt reports that she is also on medication for her arthritis and has been taking occasional  ibuprofen in addition to her arthritis medication  Past Medical History  Diagnosis Date  . Diabetes mellitus   . Hypertension   . Arthritis   . Cancer approx 2006    rt breast/lumpectomy/chemo/rad tx  . Breast cancer 09/14/2013  . Shingles    Past Surgical History  Procedure Laterality Date  . Abdominal hysterectomy    . Cholecystectomy    . Breast surgery     No family history on file. History  Substance Use Topics  . Smoking status: Never Smoker   .  Smokeless tobacco: Not on file  . Alcohol Use: No   OB History   Grav Para Term Preterm Abortions TAB SAB Ect Mult Living                 Review of Systems  Constitutional:       Slightly fatigued from not getting much sleep last night.   HENT: Positive for nosebleeds.   Gastrointestinal: Negative for nausea and vomiting.  Neurological: Positive for headaches.  All other systems reviewed and are negative.     Allergies  Penicillins  Home Medications   Prior to Admission medications   Medication Sig Start Date End Date Taking? Authorizing Provider  acetaminophen (TYLENOL) 500 MG tablet Take 500 mg by mouth every 6 (six) hours as needed. Pain    Historical Provider, MD  amLODipine (NORVASC) 5 MG tablet Take 5 mg by mouth daily.    Historical Provider, MD  Cholecalciferol (VITAMIN D PO) Take 1 tablet by mouth 2 (two) times daily.    Historical Provider, MD  glimepiride (AMARYL) 2 MG tablet Take 1 mg by mouth daily with breakfast.    Historical Provider, MD  leflunomide (ARAVA) 20 MG tablet Take 20 mg by mouth daily.    Historical Provider, MD  losartan-hydrochlorothiazide (HYZAAR) 100-25 MG per tablet Take 1 tablet by mouth daily.    Historical Provider, MD  metFORMIN (GLUCOPHAGE-XR) 500 MG 24 hr tablet Take 1,000 mg by mouth daily.  Historical Provider, MD  Multiple Vitamin (MULITIVITAMIN WITH MINERALS) TABS Take 1 tablet by mouth daily.    Historical Provider, MD  oxyCODONE-acetaminophen (PERCOCET/ROXICET) 5-325 MG per tablet Take 2 tablets by mouth every 4 (four) hours as needed for severe pain. 12/19/13   Charles B. Karle Starch, MD  pravastatin (PRAVACHOL) 20 MG tablet Take 20 mg by mouth daily.    Historical Provider, MD   BP 131/104  Pulse 77  Temp(Src) 99.1 F (37.3 C) (Oral)  Resp 20  Ht 5' (1.524 m)  Wt 166 lb (75.297 kg)  BMI 32.42 kg/m2  SpO2 98% Physical Exam  Nursing note and vitals reviewed. Constitutional: She is oriented to person, place, and time. She  appears well-developed and well-nourished. No distress.  HENT:  Head: Normocephalic and atraumatic.  Mouth/Throat: Oropharynx is clear and moist.  Nasal mucosa is edematous bilaterally. No active bleeding or dried blood in the nares.  Airway patent.   Eyes: Conjunctivae and EOM are normal. Pupils are equal, round, and reactive to light.  Neck: Normal range of motion, full passive range of motion without pain and phonation normal. Neck supple. No tracheal deviation present.  Cardiovascular: Normal rate, normal heart sounds and intact distal pulses.  Exam reveals no gallop and no friction rub.   No murmur heard. Pulmonary/Chest: Effort normal and breath sounds normal. No respiratory distress.  Musculoskeletal: Normal range of motion.  Lymphadenopathy:    She has no cervical adenopathy.  Neurological: She is alert and oriented to person, place, and time. She exhibits normal muscle tone. Coordination normal.  Skin: Skin is warm and dry.  Psychiatric: She has a normal mood and affect. Her behavior is normal.    ED Course  Procedures (including critical care time) Labs Review Labs Reviewed - No data to display  CBC Latest Ref Rng 07/11/2014 12/19/2013 09/10/2013  WBC 4.0 - 10.5 K/uL 8.9 9.7 8.8  Hemoglobin 12.0 - 15.0 g/dL 11.1(L) 10.8(L) 11.7  Hematocrit 36.0 - 46.0 % 33.7(L) 32.8(L) 36.9  Platelets 150 - 400 K/uL 345 364 359    Filed Vitals:   07/11/14 1239  BP: 177/71  Pulse: 64  Temp:   Resp: 16     Imaging Review No results found.   EKG Interpretation None     Medications - No data to display  12:20 PM- Treatment plan was discussed with patient who verbalizes understanding and agrees.   MDM   Final diagnoses:  Right-sided epistaxis    Pt is well appearing, no focal neuro deficits, has ambulated to the restroom several times without difficulty.  No active epistaxis on arrival or during stay. Headache is frontal and described as mild and gradual in onset.  Afrin nasal  spray applied, and pt advised to use for only 3 days.  She appears stable for d/c and advised to return here for any worsening sx's and to f/u with her PMD for recheck.  Pt agrees to plan   I personally performed the services described in this documentation, which was scribed in my presence. The recorded information has been reviewed and is accurate.   Hendrix Console L. Vanessa [place], PA-C 07/12/14 2008

## 2014-07-11 NOTE — ED Notes (Addendum)
Nosebleed began last night around 11pm and lasted until 6 am. She reports having a mild HA prior to onset and it is still present. She is on HTN meds, took them this AM.  She takes 81mg  ASA daily, but no other anticoagulants and is on a DMARD for her arthritis. She has had nosebleeds in past, but they only lasted a few minutes.

## 2014-07-11 NOTE — ED Notes (Signed)
Pt states that she was having a nosebleed during the night.  This is now resolved.  Pt denies any weakness but states that she is exhaused as she didn't sleep bc she felt afraid to.  Pt is alert and oriented and appears well.

## 2014-07-14 NOTE — ED Provider Notes (Signed)
Medical screening examination/treatment/procedure(s) were performed by non-physician practitioner and as supervising physician I was immediately available for consultation/collaboration.   EKG Interpretation None        Mariea Clonts, MD 07/14/14 1606

## 2014-09-15 ENCOUNTER — Other Ambulatory Visit: Payer: Self-pay | Admitting: *Deleted

## 2014-09-15 DIAGNOSIS — C50919 Malignant neoplasm of unspecified site of unspecified female breast: Secondary | ICD-10-CM

## 2014-09-16 ENCOUNTER — Ambulatory Visit: Payer: Medicare Other | Admitting: Oncology

## 2014-09-16 ENCOUNTER — Other Ambulatory Visit (HOSPITAL_BASED_OUTPATIENT_CLINIC_OR_DEPARTMENT_OTHER): Payer: Medicare HMO

## 2014-09-16 ENCOUNTER — Ambulatory Visit (HOSPITAL_BASED_OUTPATIENT_CLINIC_OR_DEPARTMENT_OTHER): Payer: Medicare HMO | Admitting: Hematology and Oncology

## 2014-09-16 ENCOUNTER — Other Ambulatory Visit: Payer: Medicare Other

## 2014-09-16 VITALS — BP 174/72 | HR 82 | Temp 98.6°F | Resp 18 | Ht 60.0 in | Wt 164.3 lb

## 2014-09-16 DIAGNOSIS — C50311 Malignant neoplasm of lower-inner quadrant of right female breast: Secondary | ICD-10-CM

## 2014-09-16 DIAGNOSIS — Z853 Personal history of malignant neoplasm of breast: Secondary | ICD-10-CM

## 2014-09-16 DIAGNOSIS — C50919 Malignant neoplasm of unspecified site of unspecified female breast: Secondary | ICD-10-CM

## 2014-09-16 LAB — CBC WITH DIFFERENTIAL/PLATELET
BASO%: 0.4 % (ref 0.0–2.0)
BASOS ABS: 0 10*3/uL (ref 0.0–0.1)
EOS%: 3.3 % (ref 0.0–7.0)
Eosinophils Absolute: 0.2 10*3/uL (ref 0.0–0.5)
HCT: 34.9 % (ref 34.8–46.6)
HGB: 11.1 g/dL — ABNORMAL LOW (ref 11.6–15.9)
LYMPH%: 25.3 % (ref 14.0–49.7)
MCH: 26.1 pg (ref 25.1–34.0)
MCHC: 31.8 g/dL (ref 31.5–36.0)
MCV: 81.9 fL (ref 79.5–101.0)
MONO#: 0.8 10*3/uL (ref 0.1–0.9)
MONO%: 10.2 % (ref 0.0–14.0)
NEUT#: 4.5 10*3/uL (ref 1.5–6.5)
NEUT%: 60.8 % (ref 38.4–76.8)
Platelets: 302 10*3/uL (ref 145–400)
RBC: 4.26 10*6/uL (ref 3.70–5.45)
RDW: 16.6 % — ABNORMAL HIGH (ref 11.2–14.5)
WBC: 7.4 10*3/uL (ref 3.9–10.3)
lymph#: 1.9 10*3/uL (ref 0.9–3.3)

## 2014-09-16 LAB — COMPREHENSIVE METABOLIC PANEL (CC13)
ALT: 25 U/L (ref 0–55)
ANION GAP: 11 meq/L (ref 3–11)
AST: 19 U/L (ref 5–34)
Albumin: 3.2 g/dL — ABNORMAL LOW (ref 3.5–5.0)
Alkaline Phosphatase: 95 U/L (ref 40–150)
BILIRUBIN TOTAL: 0.24 mg/dL (ref 0.20–1.20)
BUN: 16.7 mg/dL (ref 7.0–26.0)
CO2: 25 mEq/L (ref 22–29)
CREATININE: 0.8 mg/dL (ref 0.6–1.1)
Calcium: 9.5 mg/dL (ref 8.4–10.4)
Chloride: 108 mEq/L (ref 98–109)
EGFR: 89 mL/min/{1.73_m2} — ABNORMAL LOW (ref >90)
Glucose: 102 mg/dl (ref 70–140)
Potassium: 3.8 mEq/L (ref 3.5–5.1)
Sodium: 143 mEq/L (ref 136–145)
Total Protein: 7 g/dL (ref 6.4–8.3)

## 2014-09-16 NOTE — Assessment & Plan Note (Signed)
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  Surveillance: Today's breast exam is normal. Her mammograms done in May 2015 were normal. Continue with annual surveillance of physical exams and mammograms.  Survivorship: Encouraged her to do exercise. She has arthritis problems but encouraged her to do water aerobics. I reviewed her blood work which showed that she had low albumin I encouraged her to increase of fish and chicken and other protein intake. She is also slightly anemic but it is normocytic in nature. It is not concerning at this point but if she would increase her oral protein intake that should also help her red blood cells.  She'll be followed back in one year the survivorship clinic. The other option is since the patient lives in [place], she could be followed at the [facility] at [place].

## 2014-09-16 NOTE — Progress Notes (Signed)
Patient Care Team: Asencion Noble, MD as PCP - General (Internal Medicine)  DIAGNOSIS: No matching staging information was found for the patient.  SUMMARY OF ONCOLOGIC HISTORY:   Breast cancer of lower-inner quadrant of right female breast   03/18/2006 Surgery Right breast lumpectomy 2.1 cm invasive ductal carcinoma node negative, grade 3, ER negative, PR 7%, Ki-67 69%, HER-2 negative   04/16/2006 - 07/17/2006 Chemotherapy 4 cycles of TEC    08/12/2006 - 09/23/2006 Radiation Therapy Adjuvant radiation therapy   10/14/2006 - 09/17/2011 Anti-estrogen oral therapy Arimidex 1 mg by mouth daily for 5 years    CHIEF COMPLIANT: followup of breast cancer  INTERVAL HISTORY: Bethany Ray is a 68 year old African American lady with above-mentioned history of right-sided breast cancer treated with lumpectomy followed by chemotherapy and radiation followed by antiestrogen therapy for 5 years. She is here for annual followup of breast cancer. She complains of pain in the left breast intermittently. She had mammograms done in May which were normal.  REVIEW OF SYSTEMS:   Constitutional: Denies fevers, chills or abnormal weight loss Eyes: Denies blurriness of vision Ears, nose, mouth, throat, and face: Denies mucositis or sore throat Respiratory: Denies cough, dyspnea or wheezes Cardiovascular: Denies palpitation, chest discomfort or lower extremity swelling Gastrointestinal:  Denies nausea, heartburn or change in bowel habits Skin: Denies abnormal skin rashes Lymphatics: Denies new lymphadenopathy or easy bruising Neurological:Denies numbness, tingling or new weaknesses Behavioral/Psych: Mood is stable, no new changes  Breast: pain in the left breast All other systems were reviewed with the patient and are negative.  I have reviewed the past medical history, past surgical history, social history and family history with the patient and they are unchanged from previous note.  ALLERGIES:  is allergic to  penicillins.  MEDICATIONS:  Current Outpatient Prescriptions  Medication Sig Dispense Refill  . acetaminophen (TYLENOL) 500 MG tablet Take 500 mg by mouth every 6 (six) hours as needed. Pain    . amLODipine (NORVASC) 5 MG tablet Take 5 mg by mouth daily.    . B Complex-C (B-COMPLEX WITH VITAMIN C) tablet Take 1 tablet by mouth daily.    . benzonatate (TESSALON) 100 MG capsule Take by mouth 3 (three) times daily as needed for cough.    . Cholecalciferol (VITAMIN D PO) Take 1 tablet by mouth 2 (two) times daily.    Marland Kitchen glimepiride (AMARYL) 2 MG tablet Take 1 mg by mouth daily with breakfast.    . ibuprofen (ADVIL,MOTRIN) 800 MG tablet Take 800 mg by mouth every 8 (eight) hours as needed for moderate pain.    Marland Kitchen leflunomide (ARAVA) 20 MG tablet Take 20 mg by mouth daily.    Marland Kitchen losartan-hydrochlorothiazide (HYZAAR) 100-25 MG per tablet Take 1 tablet by mouth daily.    . metFORMIN (GLUCOPHAGE) 500 MG tablet Take 500 mg by mouth 2 (two) times daily with a meal.    . Multiple Vitamin (MULITIVITAMIN WITH MINERALS) TABS Take 1 tablet by mouth daily.    . pravastatin (PRAVACHOL) 20 MG tablet Take 20 mg by mouth daily.    . traMADol (ULTRAM) 50 MG tablet Take by mouth 3 (three) times daily as needed.    . vitamin E 400 UNIT capsule Take 400 Units by mouth daily.     No current facility-administered medications for this visit.    PHYSICAL EXAMINATION: ECOG PERFORMANCE STATUS: 1 - Symptomatic but completely ambulatory  Filed Vitals:   09/16/14 1536  BP: 174/72  Pulse: 82  Temp: 98.6 F (  37 C)  Resp: 18   Filed Weights   09/16/14 1536  Weight: 164 lb 4.8 oz (74.526 kg)    GENERAL:alert, no distress and comfortable SKIN: skin color, texture, turgor are normal, no rashes or significant lesions EYES: normal, Conjunctiva are pink and non-injected, sclera clear OROPHARYNX:no exudate, no erythema and lips, buccal mucosa, and tongue normal  NECK: supple, thyroid normal size, non-tender, without  nodularity LYMPH:  no palpable lymphadenopathy in the cervical, axillary or inguinal LUNGS: clear to auscultation and percussion with normal breathing effort HEART: regular rate & rhythm and no murmurs and no lower extremity edema ABDOMEN:abdomen soft, non-tender and normal bowel sounds Musculoskeletal:no cyanosis of digits and no clubbing  NEURO: alert & oriented x 3 with fluent speech, no focal motor/sensory deficits BREAST: No palpable masses or nodules in either right or left breasts. No palpable axillary supraclavicular or infraclavicular adenopathy no breast tenderness or nipple discharge.   LABORATORY DATA:  I have reviewed the data as listed   Chemistry      Component Value Date/Time   NA 143 09/16/2014 1412   NA 143 12/19/2013 2103   K 3.8 09/16/2014 1412   K 3.9 12/19/2013 2103   CL 108 12/19/2013 2103   CL 104 09/10/2012 1552   CO2 25 09/16/2014 1412   CO2 24 12/19/2013 2103   BUN 16.7 09/16/2014 1412   BUN 15 12/19/2013 2103   CREATININE 0.8 09/16/2014 1412   CREATININE 0.74 12/19/2013 2103   GLU 181* 04/26/2006 1212      Component Value Date/Time   CALCIUM 9.5 09/16/2014 1412   CALCIUM 9.4 12/19/2013 2103   ALKPHOS 95 09/16/2014 1412   ALKPHOS 100 12/19/2013 2103   AST 19 09/16/2014 1412   AST 22 12/19/2013 2103   ALT 25 09/16/2014 1412   ALT 22 12/19/2013 2103   BILITOT 0.24 09/16/2014 1412   BILITOT 0.2* 12/19/2013 2103       Lab Results  Component Value Date   WBC 7.4 09/16/2014   HGB 11.1* 09/16/2014   HCT 34.9 09/16/2014   MCV 81.9 09/16/2014   PLT 302 09/16/2014   NEUTROABS 4.5 09/16/2014     RADIOGRAPHIC STUDIES: I have personally reviewed the radiology reports and agreed with their findings. 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  Surveillance: Today's breast exam is normal. Her mammograms done in May 2015 were normal. Continue with annual surveillance of physical exams and mammograms.  Survivorship: Encouraged her to do exercise. She has arthritis problems but encouraged her to do water aerobics. I reviewed her blood work which showed that she had low albumin I encouraged her to increase of fish and chicken and other protein intake. She is also slightly anemic but it is normocytic in nature. It is not concerning at this point but if she would increase her oral protein intake that should also help her red blood cells.  She'll be followed back in one year the survivorship clinic. The other option is since the patient lives in [place], she could be followed at the [facility] at [place].     No orders of the defined types were placed in this encounter.   The patient has a good understanding of the overall plan.  she agrees with it. She will call with any problems that may develop before her next visit here.   Rulon Eisenmenger, MD 09/16/2014 4:35 PM  Oh a

## 2014-09-17 ENCOUNTER — Telehealth: Payer: Self-pay | Admitting: Hematology and Oncology

## 2014-09-17 NOTE — Telephone Encounter (Signed)
, °

## 2015-01-16 ENCOUNTER — Encounter (HOSPITAL_COMMUNITY): Payer: Self-pay | Admitting: *Deleted

## 2015-01-16 ENCOUNTER — Emergency Department (HOSPITAL_COMMUNITY)
Admission: EM | Admit: 2015-01-16 | Discharge: 2015-01-16 | Disposition: A | Discharge status: Home / Self Care | Payer: Medicare HMO | Attending: Emergency Medicine | Admitting: Emergency Medicine

## 2015-01-16 DIAGNOSIS — Z880 Allergy status to penicillin: Secondary | ICD-10-CM | POA: Insufficient documentation

## 2015-01-16 DIAGNOSIS — E119 Type 2 diabetes mellitus without complications: Secondary | ICD-10-CM | POA: Diagnosis not present

## 2015-01-16 DIAGNOSIS — R040 Epistaxis: Secondary | ICD-10-CM | POA: Diagnosis present

## 2015-01-16 DIAGNOSIS — Z853 Personal history of malignant neoplasm of breast: Secondary | ICD-10-CM | POA: Diagnosis not present

## 2015-01-16 DIAGNOSIS — M1990 Unspecified osteoarthritis, unspecified site: Secondary | ICD-10-CM | POA: Diagnosis not present

## 2015-01-16 DIAGNOSIS — I10 Essential (primary) hypertension: Secondary | ICD-10-CM | POA: Insufficient documentation

## 2015-01-16 DIAGNOSIS — R51 Headache: Secondary | ICD-10-CM | POA: Insufficient documentation

## 2015-01-16 DIAGNOSIS — R062 Wheezing: Secondary | ICD-10-CM | POA: Insufficient documentation

## 2015-01-16 DIAGNOSIS — Z8619 Personal history of other infectious and parasitic diseases: Secondary | ICD-10-CM | POA: Diagnosis not present

## 2015-01-16 DIAGNOSIS — R05 Cough: Secondary | ICD-10-CM | POA: Insufficient documentation

## 2015-01-16 LAB — PROTIME-INR
INR: 0.95 (ref 0.00–1.49)
PROTHROMBIN TIME: 12.8 s (ref 11.6–15.2)

## 2015-01-16 LAB — BASIC METABOLIC PANEL
Anion gap: 9 (ref 5–15)
BUN: 18 mg/dL (ref 6–23)
CO2: 25 mmol/L (ref 19–32)
CREATININE: 0.84 mg/dL (ref 0.50–1.10)
Calcium: 10.1 mg/dL (ref 8.4–10.5)
Chloride: 108 mmol/L (ref 96–112)
GFR calc Af Amer: 80 mL/min — ABNORMAL LOW (ref >90)
GFR calc non Af Amer: 69 mL/min — ABNORMAL LOW (ref >90)
Glucose, Bld: 123 mg/dL — ABNORMAL HIGH (ref 70–99)
POTASSIUM: 3.5 mmol/L (ref 3.5–5.1)
SODIUM: 142 mmol/L (ref 135–145)

## 2015-01-16 LAB — CBC WITH DIFFERENTIAL/PLATELET
Basophils Absolute: 0 10*3/uL (ref 0.0–0.1)
Basophils Relative: 0 % (ref 0–1)
Eosinophils Absolute: 0.1 10*3/uL (ref 0.0–0.7)
Eosinophils Relative: 1 % (ref 0–5)
HCT: 34.1 % — ABNORMAL LOW (ref 36.0–46.0)
HEMOGLOBIN: 11.1 g/dL — AB (ref 12.0–15.0)
LYMPHS ABS: 2.1 10*3/uL (ref 0.7–4.0)
LYMPHS PCT: 19 % (ref 12–46)
MCH: 27.3 pg (ref 26.0–34.0)
MCHC: 32.6 g/dL (ref 30.0–36.0)
MCV: 83.8 fL (ref 78.0–100.0)
Monocytes Absolute: 0.7 10*3/uL (ref 0.1–1.0)
Monocytes Relative: 7 % (ref 3–12)
NEUTROS PCT: 73 % (ref 43–77)
Neutro Abs: 8.1 10*3/uL — ABNORMAL HIGH (ref 1.7–7.7)
Platelets: 388 10*3/uL (ref 150–400)
RBC: 4.07 MIL/uL (ref 3.87–5.11)
RDW: 17 % — ABNORMAL HIGH (ref 11.5–15.5)
WBC: 11.1 10*3/uL — ABNORMAL HIGH (ref 4.0–10.5)

## 2015-01-16 MED ORDER — LIDOCAINE VISCOUS 2 % MT SOLN
OROMUCOSAL | Status: AC
  Filled 2015-01-16: qty 15

## 2015-01-16 MED ORDER — SILVER NITRATE-POT NITRATE 75-25 % EX MISC
CUTANEOUS | Status: AC
  Filled 2015-01-16: qty 3

## 2015-01-16 MED ORDER — SULFAMETHOXAZOLE-TRIMETHOPRIM 800-160 MG PO TABS
1.0000 | ORAL_TABLET | Freq: Two times a day (BID) | ORAL | Status: DC
Start: 2015-01-16 — End: 2015-03-17

## 2015-01-16 MED ORDER — OXYMETAZOLINE HCL 0.05 % NA SOLN
NASAL | Status: AC
  Filled 2015-01-16: qty 15

## 2015-01-16 NOTE — Discharge Instructions (Signed)
Nosebleed °A nosebleed can be caused by many things, including: °· Getting hit hard in the nose. °· Infections. °· Dry nose. °· Colds. °· Medicines. °Your doctor may do lab testing if you get nosebleeds a lot and the cause is not known. °HOME CARE  °· If your nose was packed with material, keep it there until your doctor takes it out. Put the pack back in your nose if the pack falls out. °· Do not blow your nose for 12 hours after the nosebleed. °· Sit up and bend forward if your nose starts bleeding again. Pinch the front half of your nose nonstop for 20 minutes. °· Put petroleum jelly inside your nose every morning if you have a dry nose. °· Use a humidifier to make the air less dry. °· Do not take aspirin. °· Try not to strain, lift, or bend at the waist for many days after the nosebleed. °GET HELP RIGHT AWAY IF:  °· Nosebleeds keep happening and are hard to stop or control. °· You have bleeding or bruises that are not normal on other parts of the body. °· You have a fever. °· The nosebleeds get worse. °· You get lightheaded, feel faint, sweaty, or throw up (vomit) blood. °MAKE SURE YOU:  °· Understand these instructions. °· Will watch your condition. °· Will get help right away if you are not doing well or get worse. °Document Released: 07/03/2008 Document Revised: 12/17/2011 Document Reviewed: 07/03/2008 °ExitCare® Patient Information ©2015 [employer]. This information is not intended to replace advice given to you by your health care provider. Make sure you discuss any questions you have with your health care provider. ° °

## 2015-01-16 NOTE — ED Notes (Signed)
Pt. Blew nose and expelled large clot from right notril. Afrin sprayed in nose. Lidocaine applied to cotton ball and placed in right nostril. Dr. Betsey Holiday updated.

## 2015-01-16 NOTE — ED Provider Notes (Signed)
CSN: [id number]     Arrival date & time 01/16/15  2016 History  This chart was scribed for Bethany Greek, MD by Peyton Bottoms, ED Scribe. This patient was seen in room APA05/[facility]05 and the patient's care was started at 8:25 PM.   Chief Complaint  Patient presents with  . Epistaxis   Patient is a 69 y.o. female presenting with nosebleeds. The history is provided by the patient. No language interpreter was used.  Epistaxis Location:  Bilateral Severity:  Moderate Duration:  1 hour Timing:  Constant Progression:  Unchanged Chronicity:  New Relieved by:  Nothing Worsened by:  Nothing tried Associated symptoms: cough and headaches     HPI Comments: Bethany Ray is a 69 y.o. female with a PMHx of diabetes, hypertension, arthritis, right breast cancer, and shingles, who presents to the Emergency Department complaining of moderate epistaxis onset 1 hour ago. Patient states that she is bleeding more from her right nostril. She denies associated trauma or injury. She currently reports associated headache and cough. She denies associated shortness of breath, fever or chest pain. She denies taking blood thinning medication.   Past Medical History  Diagnosis Date  . Diabetes mellitus   . Hypertension   . Arthritis   . Cancer approx 2006    rt breast/lumpectomy/chemo/rad tx  . Breast cancer 09/14/2013  . Shingles    Past Surgical History  Procedure Laterality Date  . Abdominal hysterectomy    . Cholecystectomy    . Breast surgery     History reviewed. No pertinent family history. History  Substance Use Topics  . Smoking status: Never Smoker   . Smokeless tobacco: Not on file  . Alcohol Use: No   OB History    No data available     Review of Systems  HENT: Positive for nosebleeds.   Respiratory: Positive for cough.   Neurological: Positive for headaches.  All other systems reviewed and are negative.  Allergies  Penicillins  Home Medications   Prior to  Admission medications   Medication Sig Start Date End Date Taking? Authorizing Provider  acetaminophen (TYLENOL) 500 MG tablet Take 500 mg by mouth every 6 (six) hours as needed. Pain   Yes Historical Provider, MD  amLODipine (NORVASC) 5 MG tablet Take 5 mg by mouth daily.   Yes Historical Provider, MD  B Complex-C (B-COMPLEX WITH VITAMIN C) tablet Take 1 tablet by mouth daily.   Yes Historical Provider, MD  benzonatate (TESSALON) 100 MG capsule Take by mouth 3 (three) times daily as needed for cough.   Yes Historical Provider, MD  Calcium Carbonate (CALTRATE 600 PO) Take 1 tablet by mouth 2 (two) times daily.   Yes Historical Provider, MD  Cholecalciferol (VITAMIN D PO) Take 1 tablet by mouth 2 (two) times daily.   Yes Historical Provider, MD  glimepiride (AMARYL) 2 MG tablet Take 1 mg by mouth daily with breakfast.   Yes Historical Provider, MD  leflunomide (ARAVA) 20 MG tablet Take 20 mg by mouth daily.   Yes Historical Provider, MD  losartan-hydrochlorothiazide (HYZAAR) 100-25 MG per tablet Take 1 tablet by mouth daily.   Yes Historical Provider, MD  metFORMIN (GLUCOPHAGE) 500 MG tablet Take 500 mg by mouth 2 (two) times daily with a meal.   Yes Historical Provider, MD  Multiple Vitamin (MULITIVITAMIN WITH MINERALS) TABS Take 1 tablet by mouth daily.   Yes Historical Provider, MD  pravastatin (PRAVACHOL) 20 MG tablet Take 20 mg by mouth daily.  Yes Historical Provider, MD  sulfaSALAzine (AZULFIDINE) 500 MG tablet Take 500 mg by mouth 2 (two) times daily. 12/01/14  Yes Historical Provider, MD  vitamin E 400 UNIT capsule Take 400 Units by mouth daily.   Yes Historical Provider, MD  ibuprofen (ADVIL,MOTRIN) 800 MG tablet Take 800 mg by mouth every 8 (eight) hours as needed for moderate pain.    Historical Provider, MD  sulfamethoxazole-trimethoprim (SEPTRA DS) 800-160 MG per tablet Take 1 tablet by mouth every 12 (twelve) hours. 01/16/15   Bethany Greek, MD  traMADol (ULTRAM) 50 MG tablet  Take 50 mg by mouth 3 (three) times daily as needed for moderate pain.     Historical Provider, MD   Triage Vitals: BP 159/98 mmHg  Pulse 108  Temp(Src) 98.5 F (36.9 C) (Oral)  Resp 20  Ht 5' (1.524 m)  Wt 165 lb (74.844 kg)  BMI 32.22 kg/m2  SpO2 98%  Physical Exam  Constitutional: She is oriented to person, place, and time. She appears well-developed and well-nourished. No distress.  HENT:  Head: Normocephalic and atraumatic.  Right Ear: Hearing normal.  Left Ear: Hearing normal.  Nose: Epistaxis is observed.  Mouth/Throat: Oropharynx is clear and moist and mucous membranes are normal.  Briskly bleeding from the right side, with some clots. Mouth is normal.  Eyes: Conjunctivae and EOM are normal. Pupils are equal, round, and reactive to light.  Neck: Normal range of motion. Neck supple.  Cardiovascular: Regular rhythm, S1 normal and S2 normal.  Exam reveals no gallop and no friction rub.   No murmur heard. Pulmonary/Chest: Effort normal and breath sounds normal. No respiratory distress. She exhibits no tenderness.  Abdominal: Soft. Normal appearance and bowel sounds are normal. There is no hepatosplenomegaly. There is no tenderness. There is no rebound, no guarding, no tenderness at McBurney's point and negative Murphy's sign. No hernia.  Musculoskeletal: Normal range of motion.  Neurological: She is alert and oriented to person, place, and time. She has normal strength. No cranial nerve deficit or sensory deficit. Coordination normal. GCS eye subscore is 4. GCS verbal subscore is 5. GCS motor subscore is 6.  Skin: Skin is warm, dry and intact. No rash noted. No cyanosis.  Psychiatric: She has a normal mood and affect. Her speech is normal and behavior is normal. Thought content normal.  Nursing note and vitals reviewed.  ED Course  Procedures (including critical care time)  DIAGNOSTIC STUDIES: Oxygen Saturation is 98% on RA, normal by my interpretation.    COORDINATION OF  CARE: 8:30 PM- Discussed plans to attempt to cauterize bleed and order diagnostic lab work. Pt advised of plan for treatment and pt agrees.  Labs Review Labs Reviewed  CBC WITH DIFFERENTIAL/PLATELET - Abnormal; Notable for the following:    WBC 11.1 (*)    Hemoglobin 11.1 (*)    HCT 34.1 (*)    RDW 17.0 (*)    Neutro Abs 8.1 (*)    All other components within normal limits  BASIC METABOLIC PANEL - Abnormal; Notable for the following:    Glucose, Bld 123 (*)    GFR calc non Af Amer 69 (*)    GFR calc Af Amer 80 (*)    All other components within normal limits  PROTIME-INR    Imaging Review No results found.   EKG Interpretation None     MDM   Final diagnoses:  Epistaxis    She presents to the ER for evaluation of nosebleed. Symptoms began approximately an  hour before coming to the ER. Patient reports a large amount of bleeding from the right side of her nose and some clots in the back of her throat. Examination revealed brisk bleeding at arrival. The nose was cleared and examined, bleeding was coming from above the nasal septum area, possibly posterior. She had a fair amount of bleeding ongoing. Because of this it was decided that the patient should be packed, rather than attempts at cauterization. A 9 cm anterior and posterior rapid Rhino packing was placed. Wheezing has stopped. She was observed for a period of time here in the ER, no further bleeding. Labs are unremarkable. Will be started on Bactrim, follow up with ENT for packing removal. She was counseled return to the ER if her bleeding recurred.  I personally performed the services described in this documentation, which was scribed in my presence. The recorded information has been reviewed and is accurate.    Bethany Greek, MD 01/16/15 [phone]

## 2015-01-16 NOTE — ED Notes (Signed)
Pt c/o nose bleeding x 1 hour; pt states her nose started with out warning; pt states she has a headache now;

## 2015-01-20 ENCOUNTER — Ambulatory Visit (INDEPENDENT_AMBULATORY_CARE_PROVIDER_SITE_OTHER): Payer: Medicare HMO | Admitting: Otolaryngology

## 2015-01-20 DIAGNOSIS — R040 Epistaxis: Secondary | ICD-10-CM | POA: Diagnosis not present

## 2015-02-17 ENCOUNTER — Ambulatory Visit (INDEPENDENT_AMBULATORY_CARE_PROVIDER_SITE_OTHER): Payer: Medicare HMO | Admitting: Otolaryngology

## 2015-02-17 DIAGNOSIS — R040 Epistaxis: Secondary | ICD-10-CM | POA: Diagnosis not present

## 2015-03-17 ENCOUNTER — Emergency Department (HOSPITAL_COMMUNITY)
Admission: EM | Admit: 2015-03-17 | Discharge: 2015-03-17 | Disposition: A | Discharge status: Home / Self Care | Payer: Medicare HMO | Attending: Emergency Medicine | Admitting: Emergency Medicine

## 2015-03-17 ENCOUNTER — Encounter (HOSPITAL_COMMUNITY): Payer: Self-pay | Admitting: *Deleted

## 2015-03-17 DIAGNOSIS — Z79899 Other long term (current) drug therapy: Secondary | ICD-10-CM | POA: Diagnosis not present

## 2015-03-17 DIAGNOSIS — M1990 Unspecified osteoarthritis, unspecified site: Secondary | ICD-10-CM | POA: Insufficient documentation

## 2015-03-17 DIAGNOSIS — R040 Epistaxis: Secondary | ICD-10-CM

## 2015-03-17 DIAGNOSIS — Z853 Personal history of malignant neoplasm of breast: Secondary | ICD-10-CM | POA: Insufficient documentation

## 2015-03-17 DIAGNOSIS — E119 Type 2 diabetes mellitus without complications: Secondary | ICD-10-CM | POA: Diagnosis not present

## 2015-03-17 DIAGNOSIS — I10 Essential (primary) hypertension: Secondary | ICD-10-CM | POA: Insufficient documentation

## 2015-03-17 DIAGNOSIS — Z880 Allergy status to penicillin: Secondary | ICD-10-CM | POA: Diagnosis not present

## 2015-03-17 DIAGNOSIS — Z8619 Personal history of other infectious and parasitic diseases: Secondary | ICD-10-CM | POA: Insufficient documentation

## 2015-03-17 MED ORDER — OXYMETAZOLINE HCL 0.05 % NA SOLN
NASAL | Status: AC
  Administered 2015-03-17: 05:00:00
  Filled 2015-03-17: qty 15

## 2015-03-17 MED ORDER — SULFAMETHOXAZOLE-TRIMETHOPRIM 800-160 MG PO TABS: 1.0000 | ORAL_TABLET | Freq: Two times a day (BID) | ORAL | Status: AC

## 2015-03-17 NOTE — Discharge Instructions (Signed)
Nosebleed Call Dr. Benjamine Mola for an appointment today. If bleeding recurs, hold pressure for 30 minutes and then come to the ED if it is still not controlled. Return to the ED if you develop new or worsening symptoms. Nosebleeds can be caused by many conditions, including trauma, infections, polyps, foreign bodies, dry mucous membranes or climate, medicines, and air conditioning. Most nosebleeds occur in the front of the nose. Because of this location, most nosebleeds can be controlled by pinching the nostrils gently and continuously for at least 10 to 20 minutes. The long, continuous pressure allows enough time for the blood to clot. If pressure is released during that 10 to 20 minute time period, the process may have to be started again. The nosebleed may stop by itself or quit with pressure, or it may need concentrated heating (cautery) or pressure from packing. HOME CARE INSTRUCTIONS   If your nose was packed, try to maintain the pack inside until your health care provider removes it. If a gauze pack was used and it starts to fall out, gently replace it or cut the end off. Do not cut if a balloon catheter was used to pack the nose. Otherwise, do not remove unless instructed.  Avoid blowing your nose for 12 hours after treatment. This could dislodge the pack or clot and start the bleeding again.  If the bleeding starts again, sit up and bend forward, gently pinching the front half of your nose continuously for 20 minutes.  If bleeding was caused by dry mucous membranes, use over-the-counter saline nasal spray or gel. This will keep the mucous membranes moist and allow them to heal. If you must use a lubricant, choose the water-soluble variety. Use it only sparingly and not within several hours of lying down.  Do not use petroleum jelly or mineral oil, as these may drip into the lungs and cause serious problems.  Maintain humidity in your home by using less air conditioning or by using a  humidifier.  Do not use aspirin or medicines which make bleeding more likely. Your health care provider can give you recommendations on this.  Resume normal activities as you are able, but try to avoid straining, lifting, or bending at the waist for several days.  If the nosebleeds become recurrent and the cause is unknown, your health care provider may suggest laboratory tests. SEEK MEDICAL CARE IF: You have a fever. SEEK IMMEDIATE MEDICAL CARE IF:   Bleeding recurs and cannot be controlled.  There is unusual bleeding from or bruising on other parts of the body.  Nosebleeds continue.  There is any worsening of the condition which originally brought you in.  You become light-headed, feel faint, become sweaty, or vomit blood. MAKE SURE YOU:   Understand these instructions.  Will watch your condition.  Will get help right away if you are not doing well or get worse. Document Released: 07/04/2005 Document Revised: 02/08/2014 Document Reviewed: 08/25/2009 [facility] Patient Information 2015 [place], [place]. This information is not intended to replace advice given to you by your health care provider. Make sure you discuss any questions you have with your health care provider.

## 2015-03-17 NOTE — ED Notes (Signed)
Pt reporting bleeding controlled at present time

## 2015-03-17 NOTE — ED Notes (Signed)
Pt reports nose bleed that began about 2 hours ago.

## 2015-03-17 NOTE — ED Provider Notes (Signed)
CSN: [id number]     Arrival date & time 03/17/15  0454 History   First MD Initiated Contact with Patient 03/17/15 0510     No chief complaint on file.    (Consider location/radiation/quality/duration/timing/severity/associated sxs/prior Treatment) HPI Comments: Right-sided nosebleed for the past 2 hours. No trauma. History of similar nose bleeds on the right side. Has required packing. No anticoagulation use. No difficulty breathing or swallowing. No chest pain or shortness of breath. No dizziness or lightheadedness.  The history is provided by the patient.    Past Medical History  Diagnosis Date  . Diabetes mellitus   . Hypertension   . Arthritis   . Cancer approx 2006    rt breast/lumpectomy/chemo/rad tx  . Breast cancer 09/14/2013  . Shingles    Past Surgical History  Procedure Laterality Date  . Abdominal hysterectomy    . Cholecystectomy    . Breast surgery     No family history on file. History  Substance Use Topics  . Smoking status: Never Smoker   . Smokeless tobacco: Not on file  . Alcohol Use: No   OB History    No data available     Review of Systems  Constitutional: Negative for fever, activity change and appetite change.  HENT: Positive for nosebleeds.   Eyes: Negative for visual disturbance.  Respiratory: Negative for cough, chest tightness and shortness of breath.   Cardiovascular: Negative for chest pain.  Gastrointestinal: Negative for nausea, vomiting and abdominal pain.  Genitourinary: Negative for dysuria, hematuria, vaginal bleeding and vaginal discharge.  Musculoskeletal: Negative for myalgias and arthralgias.  Skin: Negative for rash.  Neurological: Negative for dizziness, weakness and headaches.  A complete 10 system review of systems was obtained and all systems are negative except as noted in the HPI and PMH.      Allergies  Penicillins  Home Medications   Prior to Admission medications   Medication Sig Start Date End Date Taking?  Authorizing Provider  acetaminophen (TYLENOL) 500 MG tablet Take 500 mg by mouth every 6 (six) hours as needed. Pain    Historical Provider, MD  amLODipine (NORVASC) 5 MG tablet Take 5 mg by mouth daily.    Historical Provider, MD  B Complex-C (B-COMPLEX WITH VITAMIN C) tablet Take 1 tablet by mouth daily.    Historical Provider, MD  benzonatate (TESSALON) 100 MG capsule Take by mouth 3 (three) times daily as needed for cough.    Historical Provider, MD  Calcium Carbonate (CALTRATE 600 PO) Take 1 tablet by mouth 2 (two) times daily.    Historical Provider, MD  Cholecalciferol (VITAMIN D PO) Take 1 tablet by mouth 2 (two) times daily.    Historical Provider, MD  glimepiride (AMARYL) 2 MG tablet Take 1 mg by mouth daily with breakfast.    Historical Provider, MD  ibuprofen (ADVIL,MOTRIN) 800 MG tablet Take 800 mg by mouth every 8 (eight) hours as needed for moderate pain.    Historical Provider, MD  leflunomide (ARAVA) 20 MG tablet Take 20 mg by mouth daily.    Historical Provider, MD  losartan-hydrochlorothiazide (HYZAAR) 100-25 MG per tablet Take 1 tablet by mouth daily.    Historical Provider, MD  metFORMIN (GLUCOPHAGE) 500 MG tablet Take 500 mg by mouth 2 (two) times daily with a meal.    Historical Provider, MD  Multiple Vitamin (MULITIVITAMIN WITH MINERALS) TABS Take 1 tablet by mouth daily.    Historical Provider, MD  pravastatin (PRAVACHOL) 20 MG tablet Take 20 mg by  mouth daily.    Historical Provider, MD  sulfamethoxazole-trimethoprim (SEPTRA DS) 800-160 MG per tablet Take 1 tablet by mouth every 12 (twelve) hours. 01/16/15   Orpah Greek, MD  sulfaSALAzine (AZULFIDINE) 500 MG tablet Take 500 mg by mouth 2 (two) times daily. 12/01/14   Historical Provider, MD  traMADol (ULTRAM) 50 MG tablet Take 50 mg by mouth 3 (three) times daily as needed for moderate pain.     Historical Provider, MD  vitamin E 400 UNIT capsule Take 400 Units by mouth daily.    Historical Provider, MD   BP  157/78 mmHg  Pulse 76  Temp(Src) 98.4 F (36.9 C) (Oral)  Resp 18  SpO2 96% Physical Exam  Constitutional: She is oriented to person, place, and time. She appears well-developed and well-nourished. No distress.  HENT:  Head: Normocephalic and atraumatic.  Mouth/Throat: Oropharynx is clear and moist. No oropharyngeal exudate.  Oozing from R nose.  Large clots evacuated. Small amount of blood in posterior pharynx.  Eyes: Conjunctivae and EOM are normal. Pupils are equal, round, and reactive to light.  Neck: Normal range of motion. Neck supple.  No meningismus.  Cardiovascular: Normal rate, regular rhythm, normal heart sounds and intact distal pulses.   No murmur heard. Pulmonary/Chest: Effort normal and breath sounds normal. No respiratory distress.  Abdominal: Soft. There is no tenderness. There is no rebound and no guarding.  Musculoskeletal: Normal range of motion. She exhibits no edema or tenderness.  Neurological: She is alert and oriented to person, place, and time. No cranial nerve deficit. She exhibits normal muscle tone. Coordination normal.  No ataxia on finger to nose bilaterally. No pronator drift. 5/5 strength throughout. CN 2-12 intact. Negative Romberg. Equal grip strength. Sensation intact. Gait is normal.   Skin: Skin is warm.  Psychiatric: She has a normal mood and affect. Her behavior is normal.  Nursing note and vitals reviewed.   ED Course  EPISTAXIS MANAGEMENT Date/Time: 03/17/2015 5:22 AM Performed by: Ezequiel Essex Authorized by: Ezequiel Essex Consent: Verbal consent obtained. Risks and benefits: risks, benefits and alternatives were discussed Consent given by: patient Patient understanding: patient states understanding of the procedure being performed Patient consent: the patient's understanding of the procedure matches consent given Procedure consent: procedure consent matches procedure scheduled Patient identity confirmed: provided demographic data  and verbally with patient Time out: Immediately prior to procedure a "time out" was called to verify the correct patient, procedure, equipment, support staff and site/side marked as required. Anesthesia: local infiltration Local anesthetic: topical anesthetic Patient sedated: no Treatment site: right posterior and right anterior Repair method: suction, cophenylcaine and anterior pack Post-procedure assessment: bleeding stopped Treatment complexity: complex Recurrence: recurrence of recent bleed Patient tolerance: Patient tolerated the procedure well with no immediate complications   (including critical care time) Labs Review Labs Reviewed - No data to display  Imaging Review No results found.   EKG Interpretation None      MDM   Final diagnoses:  Epistaxis  Atraumatic epistaxis x2 hours.  No anticoagulation use.  Large clots evacuated on arrival  Afrin given, pressure held.   Recurrence of bleeding with clots after observation.  Rapid rhino 7.5 cm placed.  Bleeding controlled in the ED.  Follow up with ENT.  Antibiotics started.  Return precautions discussed.   Ezequiel Essex, MD 03/17/15 [phone]

## 2015-04-28 ENCOUNTER — Ambulatory Visit (INDEPENDENT_AMBULATORY_CARE_PROVIDER_SITE_OTHER): Payer: Medicare HMO | Admitting: Otolaryngology

## 2015-07-11 DIAGNOSIS — Z79899 Other long term (current) drug therapy: Secondary | ICD-10-CM | POA: Diagnosis not present

## 2015-07-11 DIAGNOSIS — M79671 Pain in right foot: Secondary | ICD-10-CM | POA: Diagnosis not present

## 2015-07-11 DIAGNOSIS — M79672 Pain in left foot: Secondary | ICD-10-CM | POA: Diagnosis not present

## 2015-07-11 DIAGNOSIS — M25441 Effusion, right hand: Secondary | ICD-10-CM | POA: Diagnosis not present

## 2015-07-11 DIAGNOSIS — M069 Rheumatoid arthritis, unspecified: Secondary | ICD-10-CM | POA: Diagnosis not present

## 2015-07-11 DIAGNOSIS — M25561 Pain in right knee: Secondary | ICD-10-CM | POA: Diagnosis not present

## 2015-07-11 DIAGNOSIS — M25562 Pain in left knee: Secondary | ICD-10-CM | POA: Diagnosis not present

## 2015-07-11 DIAGNOSIS — M25432 Effusion, left wrist: Secondary | ICD-10-CM | POA: Diagnosis not present

## 2015-07-19 DIAGNOSIS — L851 Acquired keratosis [keratoderma] palmaris et plantaris: Secondary | ICD-10-CM | POA: Diagnosis not present

## 2015-07-19 DIAGNOSIS — E1342 Other specified diabetes mellitus with diabetic polyneuropathy: Secondary | ICD-10-CM | POA: Diagnosis not present

## 2015-07-19 DIAGNOSIS — L609 Nail disorder, unspecified: Secondary | ICD-10-CM | POA: Diagnosis not present

## 2015-07-29 DIAGNOSIS — R69 Illness, unspecified: Secondary | ICD-10-CM | POA: Diagnosis not present

## 2015-08-10 DIAGNOSIS — M25562 Pain in left knee: Secondary | ICD-10-CM | POA: Diagnosis not present

## 2015-08-10 DIAGNOSIS — M0570 Rheumatoid arthritis with rheumatoid factor of unspecified site without organ or systems involvement: Secondary | ICD-10-CM | POA: Diagnosis not present

## 2015-08-10 DIAGNOSIS — E119 Type 2 diabetes mellitus without complications: Secondary | ICD-10-CM | POA: Diagnosis not present

## 2015-08-10 DIAGNOSIS — Z79899 Other long term (current) drug therapy: Secondary | ICD-10-CM | POA: Diagnosis not present

## 2015-08-10 DIAGNOSIS — M25561 Pain in right knee: Secondary | ICD-10-CM | POA: Diagnosis not present

## 2015-09-16 DIAGNOSIS — R69 Illness, unspecified: Secondary | ICD-10-CM | POA: Diagnosis not present

## 2015-09-19 NOTE — Assessment & Plan Note (Deleted)
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  Surveillance: Today's breast exam is normal. Her mammograms done in May 2015 were normal. Continue with annual surveillance of physical exams and mammograms.  Survivorship: Encouraged her to do exercise. She has arthritis problems but encouraged her to do water aerobics.   She'll be followed back in one year the survivorship clinic. The other option is since the patient lives in [place], she could be followed at the [facility] at [place].

## 2015-09-20 ENCOUNTER — Ambulatory Visit: Payer: Medicare HMO | Admitting: Hematology and Oncology

## 2015-09-23 DIAGNOSIS — M0570 Rheumatoid arthritis with rheumatoid factor of unspecified site without organ or systems involvement: Secondary | ICD-10-CM | POA: Diagnosis not present

## 2015-09-23 DIAGNOSIS — Z79899 Other long term (current) drug therapy: Secondary | ICD-10-CM | POA: Diagnosis not present

## 2015-09-27 DIAGNOSIS — H3589 Other specified retinal disorders: Secondary | ICD-10-CM | POA: Diagnosis not present

## 2015-09-27 DIAGNOSIS — H25812 Combined forms of age-related cataract, left eye: Secondary | ICD-10-CM | POA: Diagnosis not present

## 2015-09-27 DIAGNOSIS — H2512 Age-related nuclear cataract, left eye: Secondary | ICD-10-CM | POA: Diagnosis not present

## 2015-09-27 DIAGNOSIS — H21562 Pupillary abnormality, left eye: Secondary | ICD-10-CM | POA: Diagnosis not present

## 2015-09-27 DIAGNOSIS — H25012 Cortical age-related cataract, left eye: Secondary | ICD-10-CM | POA: Diagnosis not present

## 2015-10-11 DIAGNOSIS — M79641 Pain in right hand: Secondary | ICD-10-CM | POA: Diagnosis not present

## 2015-10-11 DIAGNOSIS — M0570 Rheumatoid arthritis with rheumatoid factor of unspecified site without organ or systems involvement: Secondary | ICD-10-CM | POA: Diagnosis not present

## 2015-10-11 DIAGNOSIS — M25561 Pain in right knee: Secondary | ICD-10-CM | POA: Diagnosis not present

## 2015-10-11 DIAGNOSIS — M25562 Pain in left knee: Secondary | ICD-10-CM | POA: Diagnosis not present

## 2015-10-11 DIAGNOSIS — M79642 Pain in left hand: Secondary | ICD-10-CM | POA: Diagnosis not present

## 2015-10-11 DIAGNOSIS — Z79899 Other long term (current) drug therapy: Secondary | ICD-10-CM | POA: Diagnosis not present

## 2015-11-07 DIAGNOSIS — R69 Illness, unspecified: Secondary | ICD-10-CM | POA: Diagnosis not present

## 2015-11-07 DIAGNOSIS — E119 Type 2 diabetes mellitus without complications: Secondary | ICD-10-CM | POA: Diagnosis not present

## 2015-11-15 DIAGNOSIS — E1129 Type 2 diabetes mellitus with other diabetic kidney complication: Secondary | ICD-10-CM | POA: Diagnosis not present

## 2015-11-15 DIAGNOSIS — I10 Essential (primary) hypertension: Secondary | ICD-10-CM | POA: Diagnosis not present

## 2015-11-15 DIAGNOSIS — Z6834 Body mass index (BMI) 34.0-34.9, adult: Secondary | ICD-10-CM | POA: Diagnosis not present

## 2015-12-27 DIAGNOSIS — Z79899 Other long term (current) drug therapy: Secondary | ICD-10-CM | POA: Diagnosis not present

## 2015-12-27 DIAGNOSIS — M25561 Pain in right knee: Secondary | ICD-10-CM | POA: Diagnosis not present

## 2015-12-27 DIAGNOSIS — M25442 Effusion, left hand: Secondary | ICD-10-CM | POA: Diagnosis not present

## 2015-12-27 DIAGNOSIS — M0570 Rheumatoid arthritis with rheumatoid factor of unspecified site without organ or systems involvement: Secondary | ICD-10-CM | POA: Diagnosis not present

## 2015-12-27 DIAGNOSIS — H1031 Unspecified acute conjunctivitis, right eye: Secondary | ICD-10-CM | POA: Diagnosis not present

## 2015-12-27 DIAGNOSIS — M25562 Pain in left knee: Secondary | ICD-10-CM | POA: Diagnosis not present

## 2015-12-27 DIAGNOSIS — M25441 Effusion, right hand: Secondary | ICD-10-CM | POA: Diagnosis not present

## 2015-12-31 DIAGNOSIS — R69 Illness, unspecified: Secondary | ICD-10-CM | POA: Diagnosis not present

## 2016-01-26 DIAGNOSIS — R69 Illness, unspecified: Secondary | ICD-10-CM | POA: Diagnosis not present

## 2016-01-31 DIAGNOSIS — E1342 Other specified diabetes mellitus with diabetic polyneuropathy: Secondary | ICD-10-CM | POA: Diagnosis not present

## 2016-01-31 DIAGNOSIS — L851 Acquired keratosis [keratoderma] palmaris et plantaris: Secondary | ICD-10-CM | POA: Diagnosis not present

## 2016-01-31 DIAGNOSIS — L609 Nail disorder, unspecified: Secondary | ICD-10-CM | POA: Diagnosis not present

## 2016-01-31 DIAGNOSIS — R05 Cough: Secondary | ICD-10-CM | POA: Diagnosis not present

## 2016-02-17 DIAGNOSIS — B0052 Herpesviral keratitis: Secondary | ICD-10-CM | POA: Diagnosis not present

## 2016-02-17 DIAGNOSIS — R69 Illness, unspecified: Secondary | ICD-10-CM | POA: Diagnosis not present

## 2016-02-20 DIAGNOSIS — E119 Type 2 diabetes mellitus without complications: Secondary | ICD-10-CM | POA: Diagnosis not present

## 2016-02-20 DIAGNOSIS — H209 Unspecified iridocyclitis: Secondary | ICD-10-CM | POA: Diagnosis not present

## 2016-02-20 DIAGNOSIS — M0570 Rheumatoid arthritis with rheumatoid factor of unspecified site without organ or systems involvement: Secondary | ICD-10-CM | POA: Diagnosis not present

## 2016-02-20 DIAGNOSIS — M25561 Pain in right knee: Secondary | ICD-10-CM | POA: Diagnosis not present

## 2016-02-20 DIAGNOSIS — M25562 Pain in left knee: Secondary | ICD-10-CM | POA: Diagnosis not present

## 2016-02-21 DIAGNOSIS — B0052 Herpesviral keratitis: Secondary | ICD-10-CM | POA: Diagnosis not present

## 2016-02-21 DIAGNOSIS — H209 Unspecified iridocyclitis: Secondary | ICD-10-CM | POA: Diagnosis not present

## 2016-02-21 DIAGNOSIS — M25561 Pain in right knee: Secondary | ICD-10-CM | POA: Diagnosis not present

## 2016-02-21 DIAGNOSIS — M0570 Rheumatoid arthritis with rheumatoid factor of unspecified site without organ or systems involvement: Secondary | ICD-10-CM | POA: Diagnosis not present

## 2016-02-21 DIAGNOSIS — M25562 Pain in left knee: Secondary | ICD-10-CM | POA: Diagnosis not present

## 2016-02-24 DIAGNOSIS — B0052 Herpesviral keratitis: Secondary | ICD-10-CM | POA: Diagnosis not present

## 2016-02-29 DIAGNOSIS — B0052 Herpesviral keratitis: Secondary | ICD-10-CM | POA: Diagnosis not present

## 2016-03-08 DIAGNOSIS — B0052 Herpesviral keratitis: Secondary | ICD-10-CM | POA: Diagnosis not present

## 2016-03-09 DIAGNOSIS — E119 Type 2 diabetes mellitus without complications: Secondary | ICD-10-CM | POA: Diagnosis not present

## 2016-03-15 DIAGNOSIS — E1129 Type 2 diabetes mellitus with other diabetic kidney complication: Secondary | ICD-10-CM | POA: Diagnosis not present

## 2016-03-15 DIAGNOSIS — I10 Essential (primary) hypertension: Secondary | ICD-10-CM | POA: Diagnosis not present

## 2016-03-28 DIAGNOSIS — D485 Neoplasm of uncertain behavior of skin: Secondary | ICD-10-CM | POA: Diagnosis not present

## 2016-03-28 DIAGNOSIS — L821 Other seborrheic keratosis: Secondary | ICD-10-CM | POA: Diagnosis not present

## 2016-03-28 DIAGNOSIS — L658 Other specified nonscarring hair loss: Secondary | ICD-10-CM | POA: Diagnosis not present

## 2016-03-28 DIAGNOSIS — D2262 Melanocytic nevi of left upper limb, including shoulder: Secondary | ICD-10-CM | POA: Diagnosis not present

## 2016-03-29 DIAGNOSIS — B0052 Herpesviral keratitis: Secondary | ICD-10-CM | POA: Diagnosis not present

## 2016-04-11 DIAGNOSIS — R69 Illness, unspecified: Secondary | ICD-10-CM | POA: Diagnosis not present

## 2016-04-27 DIAGNOSIS — B0052 Herpesviral keratitis: Secondary | ICD-10-CM | POA: Diagnosis not present

## 2016-04-27 DIAGNOSIS — Z961 Presence of intraocular lens: Secondary | ICD-10-CM | POA: Diagnosis not present

## 2016-05-10 DIAGNOSIS — Z79899 Other long term (current) drug therapy: Secondary | ICD-10-CM | POA: Diagnosis not present

## 2016-05-10 DIAGNOSIS — M25561 Pain in right knee: Secondary | ICD-10-CM | POA: Diagnosis not present

## 2016-05-10 DIAGNOSIS — M79672 Pain in left foot: Secondary | ICD-10-CM | POA: Diagnosis not present

## 2016-05-10 DIAGNOSIS — M79671 Pain in right foot: Secondary | ICD-10-CM | POA: Diagnosis not present

## 2016-05-10 DIAGNOSIS — M25461 Effusion, right knee: Secondary | ICD-10-CM | POA: Diagnosis not present

## 2016-05-10 DIAGNOSIS — M0570 Rheumatoid arthritis with rheumatoid factor of unspecified site without organ or systems involvement: Secondary | ICD-10-CM | POA: Diagnosis not present

## 2016-05-24 DIAGNOSIS — Z853 Personal history of malignant neoplasm of breast: Secondary | ICD-10-CM | POA: Diagnosis not present

## 2016-05-24 DIAGNOSIS — Z1231 Encounter for screening mammogram for malignant neoplasm of breast: Secondary | ICD-10-CM | POA: Diagnosis not present

## 2016-05-29 DIAGNOSIS — R69 Illness, unspecified: Secondary | ICD-10-CM | POA: Diagnosis not present

## 2016-05-30 DIAGNOSIS — M79641 Pain in right hand: Secondary | ICD-10-CM | POA: Diagnosis not present

## 2016-05-30 DIAGNOSIS — M0570 Rheumatoid arthritis with rheumatoid factor of unspecified site without organ or systems involvement: Secondary | ICD-10-CM | POA: Diagnosis not present

## 2016-05-30 DIAGNOSIS — Z79899 Other long term (current) drug therapy: Secondary | ICD-10-CM | POA: Diagnosis not present

## 2016-05-30 DIAGNOSIS — M79642 Pain in left hand: Secondary | ICD-10-CM | POA: Diagnosis not present

## 2016-06-14 DIAGNOSIS — Z01419 Encounter for gynecological examination (general) (routine) without abnormal findings: Secondary | ICD-10-CM | POA: Diagnosis not present

## 2016-06-14 DIAGNOSIS — Z6832 Body mass index (BMI) 32.0-32.9, adult: Secondary | ICD-10-CM | POA: Diagnosis not present

## 2016-06-14 DIAGNOSIS — Z90710 Acquired absence of both cervix and uterus: Secondary | ICD-10-CM | POA: Diagnosis not present

## 2016-06-14 DIAGNOSIS — D229 Melanocytic nevi, unspecified: Secondary | ICD-10-CM | POA: Diagnosis not present

## 2016-07-17 DIAGNOSIS — M069 Rheumatoid arthritis, unspecified: Secondary | ICD-10-CM | POA: Diagnosis not present

## 2016-07-17 DIAGNOSIS — E119 Type 2 diabetes mellitus without complications: Secondary | ICD-10-CM | POA: Diagnosis not present

## 2016-07-17 DIAGNOSIS — E049 Nontoxic goiter, unspecified: Secondary | ICD-10-CM | POA: Diagnosis not present

## 2016-07-17 DIAGNOSIS — Z79899 Other long term (current) drug therapy: Secondary | ICD-10-CM | POA: Diagnosis not present

## 2016-07-17 DIAGNOSIS — C50919 Malignant neoplasm of unspecified site of unspecified female breast: Secondary | ICD-10-CM | POA: Diagnosis not present

## 2016-07-17 DIAGNOSIS — E785 Hyperlipidemia, unspecified: Secondary | ICD-10-CM | POA: Diagnosis not present

## 2016-07-17 DIAGNOSIS — I10 Essential (primary) hypertension: Secondary | ICD-10-CM | POA: Diagnosis not present

## 2016-07-23 DIAGNOSIS — R69 Illness, unspecified: Secondary | ICD-10-CM | POA: Diagnosis not present

## 2016-07-24 ENCOUNTER — Other Ambulatory Visit (HOSPITAL_COMMUNITY): Payer: Self-pay | Admitting: Internal Medicine

## 2016-07-24 ENCOUNTER — Ambulatory Visit (HOSPITAL_COMMUNITY)
Admission: RE | Admit: 2016-07-24 | Discharge: 2016-07-24 | Disposition: A | Discharge status: Home / Self Care | Payer: Medicare HMO | Source: Ambulatory Visit | Attending: Internal Medicine | Admitting: Internal Medicine

## 2016-07-24 DIAGNOSIS — R0789 Other chest pain: Secondary | ICD-10-CM

## 2016-07-24 DIAGNOSIS — M25512 Pain in left shoulder: Secondary | ICD-10-CM | POA: Diagnosis present

## 2016-07-24 DIAGNOSIS — M19012 Primary osteoarthritis, left shoulder: Secondary | ICD-10-CM | POA: Insufficient documentation

## 2016-07-24 DIAGNOSIS — Z23 Encounter for immunization: Secondary | ICD-10-CM | POA: Diagnosis not present

## 2016-07-24 DIAGNOSIS — R079 Chest pain, unspecified: Secondary | ICD-10-CM | POA: Diagnosis not present

## 2016-07-24 DIAGNOSIS — E785 Hyperlipidemia, unspecified: Secondary | ICD-10-CM | POA: Diagnosis not present

## 2016-07-24 DIAGNOSIS — E049 Nontoxic goiter, unspecified: Secondary | ICD-10-CM | POA: Diagnosis not present

## 2016-07-24 DIAGNOSIS — E1129 Type 2 diabetes mellitus with other diabetic kidney complication: Secondary | ICD-10-CM | POA: Diagnosis not present

## 2016-08-01 DIAGNOSIS — B0052 Herpesviral keratitis: Secondary | ICD-10-CM | POA: Diagnosis not present

## 2016-08-02 DIAGNOSIS — R69 Illness, unspecified: Secondary | ICD-10-CM | POA: Diagnosis not present

## 2016-08-14 DIAGNOSIS — Z79899 Other long term (current) drug therapy: Secondary | ICD-10-CM | POA: Diagnosis not present

## 2016-08-14 DIAGNOSIS — M25562 Pain in left knee: Secondary | ICD-10-CM | POA: Diagnosis not present

## 2016-08-14 DIAGNOSIS — M79642 Pain in left hand: Secondary | ICD-10-CM | POA: Diagnosis not present

## 2016-08-14 DIAGNOSIS — M0570 Rheumatoid arthritis with rheumatoid factor of unspecified site without organ or systems involvement: Secondary | ICD-10-CM | POA: Diagnosis not present

## 2016-08-14 DIAGNOSIS — M25561 Pain in right knee: Secondary | ICD-10-CM | POA: Diagnosis not present

## 2016-08-14 DIAGNOSIS — M79641 Pain in right hand: Secondary | ICD-10-CM | POA: Diagnosis not present

## 2016-08-14 DIAGNOSIS — D649 Anemia, unspecified: Secondary | ICD-10-CM | POA: Diagnosis not present

## 2016-08-22 DIAGNOSIS — Z79899 Other long term (current) drug therapy: Secondary | ICD-10-CM | POA: Diagnosis not present

## 2016-08-22 DIAGNOSIS — D649 Anemia, unspecified: Secondary | ICD-10-CM | POA: Diagnosis not present

## 2016-08-22 DIAGNOSIS — M0570 Rheumatoid arthritis with rheumatoid factor of unspecified site without organ or systems involvement: Secondary | ICD-10-CM | POA: Diagnosis not present

## 2016-09-02 ENCOUNTER — Emergency Department (HOSPITAL_COMMUNITY)
Admission: EM | Admit: 2016-09-02 | Discharge: 2016-09-02 | Disposition: A | Discharge status: Home / Self Care | Payer: Medicare HMO | Attending: Emergency Medicine | Admitting: Emergency Medicine

## 2016-09-02 ENCOUNTER — Encounter (HOSPITAL_COMMUNITY): Payer: Self-pay | Admitting: Emergency Medicine

## 2016-09-02 ENCOUNTER — Emergency Department (HOSPITAL_COMMUNITY): Payer: Medicare HMO

## 2016-09-02 DIAGNOSIS — S79921A Unspecified injury of right thigh, initial encounter: Secondary | ICD-10-CM | POA: Diagnosis not present

## 2016-09-02 DIAGNOSIS — S20212A Contusion of left front wall of thorax, initial encounter: Secondary | ICD-10-CM | POA: Insufficient documentation

## 2016-09-02 DIAGNOSIS — R51 Headache: Secondary | ICD-10-CM | POA: Insufficient documentation

## 2016-09-02 DIAGNOSIS — S76912A Strain of unspecified muscles, fascia and tendons at thigh level, left thigh, initial encounter: Secondary | ICD-10-CM | POA: Diagnosis not present

## 2016-09-02 DIAGNOSIS — R079 Chest pain, unspecified: Secondary | ICD-10-CM | POA: Diagnosis not present

## 2016-09-02 DIAGNOSIS — I10 Essential (primary) hypertension: Secondary | ICD-10-CM | POA: Insufficient documentation

## 2016-09-02 DIAGNOSIS — Y92410 Unspecified street and highway as the place of occurrence of the external cause: Secondary | ICD-10-CM | POA: Diagnosis not present

## 2016-09-02 DIAGNOSIS — Y9389 Activity, other specified: Secondary | ICD-10-CM | POA: Insufficient documentation

## 2016-09-02 DIAGNOSIS — S3992XA Unspecified injury of lower back, initial encounter: Secondary | ICD-10-CM | POA: Diagnosis not present

## 2016-09-02 DIAGNOSIS — Z7982 Long term (current) use of aspirin: Secondary | ICD-10-CM | POA: Diagnosis not present

## 2016-09-02 DIAGNOSIS — S0990XA Unspecified injury of head, initial encounter: Secondary | ICD-10-CM | POA: Diagnosis not present

## 2016-09-02 DIAGNOSIS — S46812A Strain of other muscles, fascia and tendons at shoulder and upper arm level, left arm, initial encounter: Secondary | ICD-10-CM | POA: Diagnosis not present

## 2016-09-02 DIAGNOSIS — Z79899 Other long term (current) drug therapy: Secondary | ICD-10-CM | POA: Diagnosis not present

## 2016-09-02 DIAGNOSIS — E119 Type 2 diabetes mellitus without complications: Secondary | ICD-10-CM | POA: Insufficient documentation

## 2016-09-02 DIAGNOSIS — S199XXA Unspecified injury of neck, initial encounter: Secondary | ICD-10-CM | POA: Diagnosis not present

## 2016-09-02 DIAGNOSIS — S76312A Strain of muscle, fascia and tendon of the posterior muscle group at thigh level, left thigh, initial encounter: Secondary | ICD-10-CM | POA: Diagnosis not present

## 2016-09-02 DIAGNOSIS — Y999 Unspecified external cause status: Secondary | ICD-10-CM | POA: Insufficient documentation

## 2016-09-02 DIAGNOSIS — Z853 Personal history of malignant neoplasm of breast: Secondary | ICD-10-CM | POA: Insufficient documentation

## 2016-09-02 DIAGNOSIS — S79922A Unspecified injury of left thigh, initial encounter: Secondary | ICD-10-CM | POA: Diagnosis present

## 2016-09-02 DIAGNOSIS — M545 Low back pain: Secondary | ICD-10-CM | POA: Diagnosis not present

## 2016-09-02 DIAGNOSIS — Z7984 Long term (current) use of oral hypoglycemic drugs: Secondary | ICD-10-CM | POA: Diagnosis not present

## 2016-09-02 DIAGNOSIS — M546 Pain in thoracic spine: Secondary | ICD-10-CM | POA: Diagnosis not present

## 2016-09-02 MED ORDER — MORPHINE SULFATE (PF) 4 MG/ML IV SOLN
4.0000 mg | Freq: Once | INTRAVENOUS | Status: DC
  Filled 2016-09-02: qty 1

## 2016-09-02 MED ORDER — MORPHINE SULFATE (PF) 4 MG/ML IV SOLN
INTRAVENOUS | Status: AC
  Filled 2016-09-02: qty 1

## 2016-09-02 MED ORDER — HYDROCODONE-ACETAMINOPHEN 5-325 MG PO TABS
1.0000 | ORAL_TABLET | Freq: Once | ORAL | Status: AC
  Administered 2016-09-02: 1 via ORAL
  Filled 2016-09-02: qty 1

## 2016-09-02 MED ORDER — HYDROCODONE-ACETAMINOPHEN 5-325 MG PO TABS: 1.0000 | ORAL_TABLET | Freq: Four times a day (QID) | ORAL | 0 refills | Status: DC | PRN
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
## 2016-09-20 DIAGNOSIS — R69 Illness, unspecified: Secondary | ICD-10-CM | POA: Diagnosis not present

## 2016-10-16 DIAGNOSIS — M79641 Pain in right hand: Secondary | ICD-10-CM | POA: Diagnosis not present

## 2016-10-16 DIAGNOSIS — M25562 Pain in left knee: Secondary | ICD-10-CM | POA: Diagnosis not present

## 2016-10-16 DIAGNOSIS — M0570 Rheumatoid arthritis with rheumatoid factor of unspecified site without organ or systems involvement: Secondary | ICD-10-CM | POA: Diagnosis not present

## 2016-10-16 DIAGNOSIS — M25561 Pain in right knee: Secondary | ICD-10-CM | POA: Diagnosis not present

## 2016-10-16 DIAGNOSIS — R7989 Other specified abnormal findings of blood chemistry: Secondary | ICD-10-CM | POA: Diagnosis not present

## 2016-10-16 DIAGNOSIS — D649 Anemia, unspecified: Secondary | ICD-10-CM | POA: Diagnosis not present

## 2016-10-16 DIAGNOSIS — M79642 Pain in left hand: Secondary | ICD-10-CM | POA: Diagnosis not present

## 2016-10-22 ENCOUNTER — Ambulatory Visit (HOSPITAL_COMMUNITY): Payer: Medicare HMO | Attending: Rheumatology | Admitting: Physical Therapy

## 2016-10-22 DIAGNOSIS — R262 Difficulty in walking, not elsewhere classified: Secondary | ICD-10-CM | POA: Diagnosis present

## 2016-10-22 DIAGNOSIS — M6281 Muscle weakness (generalized): Secondary | ICD-10-CM | POA: Diagnosis present

## 2016-10-22 DIAGNOSIS — G8929 Other chronic pain: Secondary | ICD-10-CM | POA: Insufficient documentation

## 2016-10-22 DIAGNOSIS — M25561 Pain in right knee: Secondary | ICD-10-CM | POA: Diagnosis present

## 2016-10-22 DIAGNOSIS — M25562 Pain in left knee: Secondary | ICD-10-CM | POA: Diagnosis present

## 2016-10-22 NOTE — Patient Instructions (Signed)
   Seated LAQ with band  Start sitting with band under arch of unaffected leg and around ankle of affected leg. Straighten your leg and slowly return to starting position keeping knees tight together.   Repeat 10-15 times each side with red band, 2-3 times per day.    ELASTIC BAND - SEATED CLAMS  While sitting in a chair and an elastic band wrapped around your knees, move both knees to the sides to separate your legs. Keep contact of your feet on the floor the entire time.  Repeat 10-15 times, 2-3 times per day.    BRIDGING ELASTIC BAND ABDUCTION  While lying on your back, place an elastic band around your knees and pull your knees apart. Hold this and then tighten your lower abdominals, squeeze your buttocks and raise your buttocks off the floor/bed as creating a "Bridge" with your body.  Repeat 10-15 times, 2-3 times per day.

## 2016-10-22 NOTE — Therapy (Signed)
[place], [place], [address] Phone: [phone]   Fax:  [phone]  Physical Therapy Evaluation  Patient Details  Name: Bethany Ray MRN: [id number] Date of Birth: 1945-12-24 Referring Provider: Hurley Cisco   Encounter Date: 10/22/2016      PT End of Session - 10/22/16 1739    Visit Number 1   Number of Visits 3   Date for PT Re-Evaluation 11/07/16   Authorization Type [employer] Medicare/HMO   Authorization Time Period 10/22/16 to 11/22/16   Authorization - Visit Number 1   Authorization - Number of Visits 10   PT Start Time [id number]   PT Stop Time 1428   PT Time Calculation (min) 43 min   Activity Tolerance Patient tolerated treatment well   Behavior During Therapy [facility] for tasks assessed/performed      Past Medical History:  Diagnosis Date  . Arthritis   . Breast cancer ([place]) 09/14/2013  . Cancer ([place]) approx 2006   rt breast/lumpectomy/chemo/rad tx  . Diabetes mellitus   . Hypertension   . Shingles     Past Surgical History:  Procedure Laterality Date  . ABDOMINAL HYSTERECTOMY    . BREAST SURGERY    . CHOLECYSTECTOMY      There were no vitals filed for this visit.       Subjective Assessment - 10/22/16 1351    Subjective Patient states that both of her knees hurt; sometimes she has a hard time getting up, a hard time walking, they will also sometimes try to give out on her while she is walking but this is not too frequent. No falls but she has had some close calls recently.    Pertinent History history of breast cancer (resolved), history of stress fracture, DM    How long can you sit comfortably? getting up out of a low chair is difficult    How long can you stand comfortably? 40 minutes    How long can you walk comfortably? sometimes uses a cane, can do shopping trips OK    Patient Stated Goals improve knees, reduce pain    Currently in Pain? Yes   Pain Score 6    Pain Location Knee   Pain Orientation Right;Left   Pain Descriptors / Indicators Aching;Sharp   Pain Type Chronic pain   Pain Radiating Towards none, stays in medial knees primarily    Pain Onset More than a month ago   Pain Frequency Intermittent   Aggravating Factors  standing a long time, walking a long time    Pain Relieving Factors stretching legs out   Effect of Pain on Daily Activities makes regular activities difficult/painful             [facility] PT Assessment - 10/22/16 0001      Assessment   Medical Diagnosis knee pain    Referring Provider Hurley Cisco    Onset Date/Surgical Date --  chronic, a couple years    Next MD Visit Dr. Charlestine Night in 2 months    Prior Therapy none      Balance Screen   Has the patient fallen in the past 6 months No   Has the patient had a decrease in activity level because of a fear of falling?  Yes   Is the patient reluctant to leave their home because of a fear of falling?  No     Prior Function   Level of Independence Independent;Independent with basic ADLs;Independent with gait;Independent  with transfers     AROM   Right Knee Extension 0   Right Knee Flexion 111   Left Knee Extension 1   Left Knee Flexion 106     Strength   Right Hip Flexion 4/5   Right Hip ABduction 3+/5   Left Hip Flexion 4+/5   Left Hip ABduction 4-/5   Right Knee Flexion 4-/5   Right Knee Extension 4/5   Left Knee Flexion 3+/5   Left Knee Extension 4-/5   Right Ankle Dorsiflexion 4+/5   Left Ankle Dorsiflexion 4+/5     Ambulation/Gait   Gait Comments rigidity of hips/lumbar spine noted, proximal weakness, reduced ankle DF, reduced heel-toe pattern, tends to keep weight forwards rather than over COM      6 minute walk test results    Aerobic Endurance Distance Walked 502   Endurance additional comments 3MWT, no device      High Level Balance   High Level Balance Comments TUG 11.08 seconds no device                           PT Education - 10/22/16  1738    Education provided Yes   Education Details prognosis, POC, HEP; we will work with her on financial concerns and will update her HEP each session, 2 sessions only    Person(s) Educated Patient   Methods Explanation;Demonstration;Handout   Comprehension Verbalized understanding;Returned demonstration;Need further instruction          PT Short Term Goals - 10/22/16 1742      PT SHORT TERM GOAL #1   Title Patient to demonstate flexion ROM bilateral knees at least 115 degrees in order to reduce pain and improve mechanics    Time 2   Period Weeks   Status New     PT SHORT TERM GOAL #2   Title Patient to show improved gait mechanics in order to improve efficiency of gait and reduce pain    Time 2   Period Weeks   Status New     PT SHORT TERM GOAL #3   Title Patient to be independent in correctly and consistently performing appropriate HEP, to be updated PRN    Time 1   Period Weeks   Status New           PT Long Term Goals - 10/22/16 1744      PT LONG TERM GOAL #1   Title Patient to be independent in appropriate advanced HEP to enhance self-efficacy in managing condition and will be able to list basic training and progression principles so that she can safely progress her own program   Time 2   Period Weeks   Status New     PT LONG TERM GOAL #2   Title Patient to be aware of local resources such as [employer], senior center, and local walking club for exercise adherence    Time 2   Period Weeks   Status New               Plan - 10/22/16 1740    Clinical Impression Statement Patient arrives with long standing knee pain of approximately 2 years in duration; she reports that it really bothers her the most with extended standing and walking, and that once in awhile her knees will try to buckle/catch due to pain. Examination reveals significant functional weakness, gait deviation, bilateral knee stiffness, and reduced functional task tolerance at this time. Patient  does express financial concerns but is willing to attend 2 more skilled PT sessions at 1x/week for HEP updates and further guidance moving forward with appropriate exercise to manage her condition. She will benefit from skilled PT services to address functional limitations and assist in design of appropriate exercise program for self-management of condition.    Rehab Potential Fair   Clinical Impairments Affecting Rehab Potential (+) motivated to participate; (-) finanical constraints, chronicity of symptoms    PT Frequency Other (comment)  2 more sessions    PT Duration Other (comment)  2 more sessions    PT Treatment/Interventions ADLs/Self Care Home Management;DME Instruction;Gait training;Stair training;Functional mobility training;Therapeutic activities;Therapeutic exercise;Balance training;Neuromuscular re-education;Patient/family education;Manual techniques;Passive range of motion;Energy conservation;Taping   PT Next Visit Plan review HEP and goals; focus on design of appropriate program/HEP for home use, design of appropriate exercise regimen    Consulted and Agree with Plan of Care Patient      Patient will benefit from skilled therapeutic intervention in order to improve the following deficits and impairments:  Abnormal gait, Increased fascial restricitons, Improper body mechanics, Pain, Decreased coordination, Decreased mobility, Postural dysfunction, Decreased activity tolerance, Decreased range of motion, Decreased strength, Hypomobility, Decreased balance, Difficulty walking, Impaired flexibility  Visit Diagnosis: Chronic pain of left knee - Plan: PT plan of care cert/re-cert  Chronic pain of right knee - Plan: PT plan of care cert/re-cert  Difficulty in walking, not elsewhere classified - Plan: PT plan of care cert/re-cert  Muscle weakness (generalized) - Plan: PT plan of care cert/re-cert      G-Codes - 0000000 [id number]    Functional Assessment Tool Used Based on skilled  clinical assessment of ROM, strength, gait, pain patterns    Functional Limitation Mobility: Walking and moving around   Mobility: Walking and Moving Around Current Status [id number]) At least 20 percent but less than 40 percent impaired, limited or restricted   Mobility: Walking and Moving Around Goal Status [id number]) At least 1 percent but less than 20 percent impaired, limited or restricted       Problem List Patient Active Problem List   Diagnosis Date Noted  . Breast cancer of lower-inner quadrant of right female breast ([place]) 09/14/2013  . STRESS FRACTURE-SITE 11/19/2007  . ANKLE SPRAIN 11/19/2007   Deniece Ree PT, DPT [facility] [address] [place], [place], [address] Phone: [phone]   Fax:  [phone]  Name: SANTOS SCHALLHORN MRN: [id number] Date of Birth: [birth year]

## 2016-10-29 DIAGNOSIS — B0052 Herpesviral keratitis: Secondary | ICD-10-CM | POA: Diagnosis not present

## 2016-10-30 ENCOUNTER — Ambulatory Visit (HOSPITAL_COMMUNITY): Payer: Medicare HMO | Admitting: Physical Therapy

## 2016-10-30 DIAGNOSIS — R262 Difficulty in walking, not elsewhere classified: Secondary | ICD-10-CM

## 2016-10-30 DIAGNOSIS — M25561 Pain in right knee: Secondary | ICD-10-CM

## 2016-10-30 DIAGNOSIS — G8929 Other chronic pain: Secondary | ICD-10-CM

## 2016-10-30 DIAGNOSIS — M25562 Pain in left knee: Principal | ICD-10-CM

## 2016-10-30 DIAGNOSIS — M6281 Muscle weakness (generalized): Secondary | ICD-10-CM

## 2016-10-30 NOTE — Therapy (Signed)
[place], [place], [address] Phone: [phone]   Fax:  [phone]  Physical Therapy Treatment  Patient Details  Name: Bethany Ray MRN: [id number] Date of Birth: 1945/10/16 Referring Provider: Hurley Cisco   Encounter Date: 10/30/2016      PT End of Session - 10/30/16 1830    Visit Number 2   Number of Visits 3   Date for PT Re-Evaluation 11/07/16   Authorization Type [employer] Medicare/HMO   Authorization Time Period 10/22/16 to 11/22/16   Authorization - Visit Number 2   Authorization - Number of Visits 10   PT Start Time 1655   PT Stop Time 1735   PT Time Calculation (min) 40 min   Activity Tolerance Patient tolerated treatment well   Behavior During Therapy [facility] for tasks assessed/performed      Past Medical History:  Diagnosis Date  . Arthritis   . Breast cancer ([place]) 09/14/2013  . Cancer ([place]) approx 2006   rt breast/lumpectomy/chemo/rad tx  . Diabetes mellitus   . Hypertension   . Shingles     Past Surgical History:  Procedure Laterality Date  . ABDOMINAL HYSTERECTOMY    . BREAST SURGERY    . CHOLECYSTECTOMY      There were no vitals filed for this visit.      Subjective Assessment - 10/30/16 1835    Subjective Pt reports compliance with HEP.  States her knees are basically the same as last session.  Most bothersome with walking and sometimes give out on her.     Currently in Pain? Yes   Pain Score 6    Pain Location Knee   Pain Orientation Right;Left   Pain Descriptors / Indicators Aching;Patsi Sears Adult PT Treatment/Exercise - 10/30/16 0001      Knee/Hip Exercises: Seated   Long Arc Quad 10 reps   Long Arc Quad Limitations RTB, reveiwed for HEP     Knee/Hip Exercises: Supine   Heel Slides Both;10 reps   Bridges 10 reps   Straight Leg Raises Both;10 reps     Knee/Hip Exercises: Sidelying   Hip ABduction Both;10 reps     Knee/Hip Exercises: Prone   Hamstring Curl 10 reps   Hip Extension Both;10 reps                PT Education - 10/30/16 1800    Education provided Yes   Education Details reviewed HEP and goals per initial evaluation.  Updated HEP for strengthening.    Person(s) Educated Patient   Methods Explanation;Demonstration;Tactile cues;Verbal cues;Handout   Comprehension Verbalized understanding;Returned demonstration;Verbal cues required;Tactile cues required;Need further instruction          PT Short Term Goals - 10/22/16 1742      PT SHORT TERM GOAL #1   Title Patient to demonstate flexion ROM bilateral knees at least 115 degrees in order to reduce pain and improve mechanics    Time 2   Period Weeks   Status New     PT SHORT TERM GOAL #2   Title Patient to show improved gait mechanics in order to improve efficiency of gait and reduce pain    Time 2   Period Weeks   Status New     PT SHORT TERM GOAL #3   Title Patient to be independent in correctly and  consistently performing appropriate HEP, to be updated PRN    Time 1   Period Weeks   Status New           PT Long Term Goals - 10/22/16 1744      PT LONG TERM GOAL #1   Title Patient to be independent in appropriate advanced HEP to enhance self-efficacy in managing condition and will be able to list basic training and progression principles so that she can safely progress her own program   Time 2   Period Weeks   Status New     PT LONG TERM GOAL #2   Title Patient to be aware of local resources such as [employer], senior center, and local walking club for exercise adherence    Time 2   Period Weeks   Status New               Plan - 10/30/16 1830    Clinical Impression Statement Reviewed initial evaluation goals and POC moving forward.  Reviewed HEP, which patient was able to recall independently. Progressed isolated strengthening of LE's this session and updated HEP.  Pt required cues to complete therex slow  and controlled, otherwise good form and no pain reported.     Rehab Potential Fair   Clinical Impairments Affecting Rehab Potential (+) motivated to participate; (-) finanical constraints, chronicity of symptoms    PT Frequency Other (comment)  2 more sessions    PT Duration Other (comment)  2 more sessions    PT Treatment/Interventions ADLs/Self Care Home Management;DME Instruction;Gait training;Stair training;Functional mobility training;Therapeutic activities;Therapeutic exercise;Balance training;Neuromuscular re-education;Patient/family education;Manual techniques;Passive range of motion;Energy conservation;Taping   PT Next Visit Plan Re-eval next session and update HEP as appropriate.   PT Home Exercise Plan at eval:  seated LAQ with RTB, seated hip abduction with RTB, Bridge with RTB  1/23: supine SLR, heelslides; sidelying hip abd, prone hip extension and hamstring curls   Consulted and Agree with Plan of Care Patient      Patient will benefit from skilled therapeutic intervention in order to improve the following deficits and impairments:  Abnormal gait, Increased fascial restricitons, Improper body mechanics, Pain, Decreased coordination, Decreased mobility, Postural dysfunction, Decreased activity tolerance, Decreased range of motion, Decreased strength, Hypomobility, Decreased balance, Difficulty walking, Impaired flexibility  Visit Diagnosis: Chronic pain of left knee  Chronic pain of right knee  Difficulty in walking, not elsewhere classified  Muscle weakness (generalized)     Problem List Patient Active Problem List   Diagnosis Date Noted  . Breast cancer of lower-inner quadrant of right female breast ([place]) 09/14/2013  . STRESS FRACTURE-SITE 11/19/2007  . ANKLE SPRAIN 11/19/2007    Teena Irani, PTA/CLT [phone]  10/30/2016, 6:37 PM  [facility] [address] [place], [place], [address] Phone: [phone]   Fax:   [phone]  Name: Bethany Ray MRN: [id number] Date of Birth: 1946-09-05

## 2016-10-30 NOTE — Patient Instructions (Signed)
Hamstrings    Lie on stomach with __0__ pound weight around left ankle.. Do not bend hips and curl leg up towards buttocks.  Repeat __10__ times. Do __2__ sessions per day. CAUTION: Move slowly.   HIP / KNEE: Extension - Prone    Squeeze glutes. Raise leg up. Keep knee straight. _10__ reps per set, _2__ sets per day   Abduction: Side Leg Lift (Eccentric) - Side-Lying    Lie on side. Lift top leg slightly higher than shoulder level. Keep top leg straight with body, toes pointing forward. Slowly lower for 3-5 seconds. _10__ reps per set, _2__ sets per day    Bracing With Heel Slides (Supine)    With neutral spine, tighten pelvic floor and abdominals and hold. Alternating legs, slide heel to bottom. Repeat _10__ times. Do _2__ times a day.    Straight Leg Raise    Tighten stomach and slowly raise locked leg.  Repeat _10___ times per set. Do __2__ sets per session.

## 2016-11-02 DIAGNOSIS — R7989 Other specified abnormal findings of blood chemistry: Secondary | ICD-10-CM | POA: Diagnosis not present

## 2016-11-02 DIAGNOSIS — M0570 Rheumatoid arthritis with rheumatoid factor of unspecified site without organ or systems involvement: Secondary | ICD-10-CM | POA: Diagnosis not present

## 2016-11-02 DIAGNOSIS — D649 Anemia, unspecified: Secondary | ICD-10-CM | POA: Diagnosis not present

## 2016-11-07 ENCOUNTER — Ambulatory Visit (HOSPITAL_COMMUNITY): Payer: Medicare HMO | Admitting: Physical Therapy

## 2016-11-07 DIAGNOSIS — M25562 Pain in left knee: Principal | ICD-10-CM

## 2016-11-07 DIAGNOSIS — R262 Difficulty in walking, not elsewhere classified: Secondary | ICD-10-CM

## 2016-11-07 DIAGNOSIS — G8929 Other chronic pain: Secondary | ICD-10-CM

## 2016-11-07 DIAGNOSIS — M6281 Muscle weakness (generalized): Secondary | ICD-10-CM

## 2016-11-07 DIAGNOSIS — M25561 Pain in right knee: Secondary | ICD-10-CM

## 2016-11-07 NOTE — Therapy (Signed)
[facility] [address] [place], [place], [address] Phone: [phone]   Fax:  [phone]  Physical Therapy Treatment (Discharge)  Patient Details  Name: Bethany Ray MRN: [id number] Date of Birth: 12-25-1945 Referring Provider: Hurley Cisco   Encounter Date: 11/07/2016      PT End of Session - 11/07/16 1741    Visit Number 3   Number of Visits 3   Date for PT Re-Evaluation 11/07/16   Authorization Type [employer] Medicare/HMO   Authorization Time Period 10/22/16 to 11/22/16   Authorization - Visit Number 3   Authorization - Number of Visits 10   PT Start Time 1602   PT Stop Time 1640   PT Time Calculation (min) 38 min   Activity Tolerance Patient tolerated treatment well   Behavior During Therapy [facility] for tasks assessed/performed      Past Medical History:  Diagnosis Date  . Arthritis   . Breast cancer ([place]) 09/14/2013  . Cancer ([place]) approx 2006   rt breast/lumpectomy/chemo/rad tx  . Diabetes mellitus   . Hypertension   . Shingles     Past Surgical History:  Procedure Laterality Date  . ABDOMINAL HYSTERECTOMY    . BREAST SURGERY    . CHOLECYSTECTOMY      There were no vitals filed for this visit.      Subjective Assessment - 11/07/16 1604    Subjective Patient arrives today stating she is doing OK. She is doing pretty good with her home program, she has not done a whole lot this week as her knee has been hurting quite a bit. She is not sure if it is any of the exercises we have given her have caused increased pain or not. She states her knee is about the same. The other day she was standing up and was about to lose her balance due to pain.    Pertinent History history of breast cancer (resolved), history of stress fracture, DM    How long can you sit comfortably? 1/31- still difficult to get up out of lower chairs    How long can you stand comfortably? 1/31- 10-15 minutes    How long can you walk comfortably? 1/31-  she mostly goes without her cane but does take it with her, tries to do without it. Shopping trips are basically her max right now.    Patient Stated Goals improve knees, reduce pain    Currently in Pain? Yes   Pain Score 6    Pain Location Knee   Pain Orientation Right;Left   Pain Descriptors / Indicators Aching;Sharp   Pain Type Chronic pain   Pain Radiating Towards none    Pain Onset More than a month ago   Pain Frequency Constant   Aggravating Factors  standing or walking a long time, getting up on her feet    Pain Relieving Factors sitting and resting them    Effect of Pain on Daily Activities doesn't stop her but makes regular activities painful             [facility] PT Assessment - 11/07/16 0001      AROM   Right Knee Extension 1   Right Knee Flexion 114   Left Knee Extension 2   Left Knee Flexion 97     Strength   Right Hip Flexion 5/5   Right Hip ABduction 4-/5   Left Hip Flexion 4+/5   Left Hip ABduction 4-/5   Right Knee Flexion 4-/5  Right Knee Extension 4+/5   Left Knee Flexion 3+/5   Left Knee Extension 4/5   Right Ankle Dorsiflexion 4+/5   Left Ankle Dorsiflexion 4+/5     Ambulation/Gait   Gait Comments rigidity of hips/lumbar spine noted, B knee stiffness noted with gait/reduced flexion, proximal weakness, reduced ankle DF, reduced heel-toe pattern, tends to keep weight forwards rather than over COM      6 minute walk test results    Aerobic Endurance Distance Walked 462   Endurance additional comments 3MWT, no device      High Level Balance   High Level Balance Comments TUG 12.08 no device                              PT Education - 11/07/16 1741    Education provided Yes   Education Details local resources such as [employer], senior center, local walking club, and local pro bono clinics for continuing PT; also discussed importance of exercise and strengthening in managing knee pain, recommended doing everything she can with surgery  as a last resort rather than jumping right to surgery for her knees.    Person(s) Educated Patient   Methods Explanation   Comprehension Verbalized understanding          PT Short Term Goals - 11/07/16 1626      PT SHORT TERM GOAL #1   Title Patient to demonstate flexion ROM bilateral knees at least 115 degrees in order to reduce pain and improve mechanics    Baseline 1/31- 1-114 R, 2-97 L    Time 2   Period Weeks   Status On-going     PT SHORT TERM GOAL #2   Title Patient to show improved gait mechanics in order to improve efficiency of gait and reduce pain    Time 2   Period Weeks   Status On-going     PT SHORT TERM GOAL #3   Title Patient to be independent in correctly and consistently performing appropriate HEP, to be updated PRN    Time 1   Period Weeks   Status Achieved           PT Long Term Goals - 11/07/16 1627      PT LONG TERM GOAL #1   Title Patient to be independent in appropriate advanced HEP to enhance self-efficacy in managing condition and will be able to list basic training and progression principles so that she can safely progress her own program   Time 2   Period Weeks   Status Partially Met     PT LONG TERM GOAL #2   Title Patient to be aware of local resources such as [employer], senior center, and local walking club for exercise adherence                Plan - 11/07/16 1742    Clinical Impression Statement Re-assessment performed today. Examination generally reveals minimal progress with objective measures however this would be expected given that patient has only had 1 treatment session, however she reports she has been compliant with her HEP and functional strength has actually improved somewhat. She continues to report financial concerns at this time, limiting ability of this clinic to proceed forward and progress her. Had extensive discussion regarding local resources such as [employer], senior center, local walking club, and local pro bono  clinics for continuing PT; also discussed importance of exercise and strengthening in managing knee pain, recommended  doing everything she can with surgery as a last resort rather than jumping right to surgery for her knees. DC today due to financial limitations    Rehab Potential Fair   Clinical Impairments Affecting Rehab Potential (+) motivated to participate; (-) finanical constraints, chronicity of symptoms    PT Next Visit Plan DC due to financial limitations    Consulted and Agree with Plan of Care Patient      Patient will benefit from skilled therapeutic intervention in order to improve the following deficits and impairments:  Abnormal gait, Increased fascial restricitons, Improper body mechanics, Pain, Decreased coordination, Decreased mobility, Postural dysfunction, Decreased activity tolerance, Decreased range of motion, Decreased strength, Hypomobility, Decreased balance, Difficulty walking, Impaired flexibility  Visit Diagnosis: Chronic pain of left knee  Chronic pain of right knee  Difficulty in walking, not elsewhere classified  Muscle weakness (generalized)       G-Codes - 2016-11-14 1743    Functional Assessment Tool Used Based on skilled clinical assessment of ROM, strength, gait, pain patterns    Functional Limitation Mobility: Walking and moving around   Mobility: Walking and Moving Around Goal Status [phone]) At least 20 percent but less than 40 percent impaired, limited or restricted   Mobility: Walking and Moving Around Discharge Status [phone]) At least 40 percent but less than 60 percent impaired, limited or restricted      Problem List Patient Active Problem List   Diagnosis Date Noted  . Breast cancer of lower-inner quadrant of right female breast ([place]) 09/14/2013  . STRESS FRACTURE-SITE 11/19/2007  . ANKLE SPRAIN 11/19/2007    PHYSICAL THERAPY DISCHARGE SUMMARY  Visits from Start of Care: 3  Current functional level related to goals / functional  outcomes: Patient displays limited progress with skilled PT services which would be expected with only 1 treatment session; she has been compliant with HEP but continues to have pain. She also continues to have financial limitations which reduce the scope of how much assistance this clinic can provide. Strongly recommended local pro-bono PT clinics as well as local [employer] and senior center. DC today due to financial limitations.    Remaining deficits: No major changes since initial eval    Education / Equipment: local resources such as [employer], senior center, local walking club, and local pro bono clinics for continuing PT; also discussed importance of exercise and strengthening in managing knee pain, recommended doing everything she can with surgery as a last resort rather than jumping right to surgery for her knees.  Plan: Patient agrees to discharge.  Patient goals were not met. Patient is being discharged due to financial reasons.  ?????      Deniece Ree PT, DPT [facility] [address] [place], [place], [address] Phone: [phone]   Fax:  [phone]  Name: Bethany Ray MRN: [id number] Date of Birth: 1946-09-23

## 2016-11-12 DIAGNOSIS — E119 Type 2 diabetes mellitus without complications: Secondary | ICD-10-CM | POA: Diagnosis not present

## 2016-11-12 DIAGNOSIS — Z79899 Other long term (current) drug therapy: Secondary | ICD-10-CM | POA: Diagnosis not present

## 2016-11-12 DIAGNOSIS — E785 Hyperlipidemia, unspecified: Secondary | ICD-10-CM | POA: Diagnosis not present

## 2016-11-24 DIAGNOSIS — M0570 Rheumatoid arthritis with rheumatoid factor of unspecified site without organ or systems involvement: Secondary | ICD-10-CM | POA: Diagnosis not present

## 2016-11-24 DIAGNOSIS — Z7984 Long term (current) use of oral hypoglycemic drugs: Secondary | ICD-10-CM | POA: Diagnosis not present

## 2016-11-24 DIAGNOSIS — Z0000 Encounter for general adult medical examination without abnormal findings: Secondary | ICD-10-CM | POA: Diagnosis not present

## 2016-11-24 DIAGNOSIS — Z6833 Body mass index (BMI) 33.0-33.9, adult: Secondary | ICD-10-CM | POA: Diagnosis not present

## 2016-11-24 DIAGNOSIS — E1142 Type 2 diabetes mellitus with diabetic polyneuropathy: Secondary | ICD-10-CM | POA: Diagnosis not present

## 2016-11-24 DIAGNOSIS — H9313 Tinnitus, bilateral: Secondary | ICD-10-CM | POA: Diagnosis not present

## 2016-11-24 DIAGNOSIS — M25569 Pain in unspecified knee: Secondary | ICD-10-CM | POA: Diagnosis not present

## 2016-11-24 DIAGNOSIS — Z7982 Long term (current) use of aspirin: Secondary | ICD-10-CM | POA: Diagnosis not present

## 2016-11-24 DIAGNOSIS — N329 Bladder disorder, unspecified: Secondary | ICD-10-CM | POA: Diagnosis not present

## 2016-11-24 DIAGNOSIS — E7800 Pure hypercholesterolemia, unspecified: Secondary | ICD-10-CM | POA: Diagnosis not present

## 2016-11-24 DIAGNOSIS — M19049 Primary osteoarthritis, unspecified hand: Secondary | ICD-10-CM | POA: Diagnosis not present

## 2016-11-24 DIAGNOSIS — E669 Obesity, unspecified: Secondary | ICD-10-CM | POA: Diagnosis not present

## 2016-11-24 DIAGNOSIS — I10 Essential (primary) hypertension: Secondary | ICD-10-CM | POA: Diagnosis not present

## 2017-01-03 DIAGNOSIS — R7989 Other specified abnormal findings of blood chemistry: Secondary | ICD-10-CM | POA: Diagnosis not present

## 2017-01-03 DIAGNOSIS — M25562 Pain in left knee: Secondary | ICD-10-CM | POA: Diagnosis not present

## 2017-01-03 DIAGNOSIS — Z79899 Other long term (current) drug therapy: Secondary | ICD-10-CM | POA: Diagnosis not present

## 2017-01-03 DIAGNOSIS — M0570 Rheumatoid arthritis with rheumatoid factor of unspecified site without organ or systems involvement: Secondary | ICD-10-CM | POA: Diagnosis not present

## 2017-01-03 DIAGNOSIS — D649 Anemia, unspecified: Secondary | ICD-10-CM | POA: Diagnosis not present

## 2017-01-03 DIAGNOSIS — M1712 Unilateral primary osteoarthritis, left knee: Secondary | ICD-10-CM | POA: Diagnosis not present

## 2017-01-03 DIAGNOSIS — M79641 Pain in right hand: Secondary | ICD-10-CM | POA: Diagnosis not present

## 2017-01-03 DIAGNOSIS — M79642 Pain in left hand: Secondary | ICD-10-CM | POA: Diagnosis not present

## 2017-01-14 DIAGNOSIS — E1129 Type 2 diabetes mellitus with other diabetic kidney complication: Secondary | ICD-10-CM | POA: Diagnosis not present

## 2017-01-14 DIAGNOSIS — E785 Hyperlipidemia, unspecified: Secondary | ICD-10-CM | POA: Diagnosis not present

## 2017-01-14 DIAGNOSIS — Z6834 Body mass index (BMI) 34.0-34.9, adult: Secondary | ICD-10-CM | POA: Diagnosis not present

## 2017-02-12 DIAGNOSIS — R69 Illness, unspecified: Secondary | ICD-10-CM | POA: Diagnosis not present

## 2017-02-27 ENCOUNTER — Ambulatory Visit (INDEPENDENT_AMBULATORY_CARE_PROVIDER_SITE_OTHER): Payer: Self-pay | Admitting: Orthopedic Surgery

## 2017-02-27 ENCOUNTER — Ambulatory Visit (INDEPENDENT_AMBULATORY_CARE_PROVIDER_SITE_OTHER): Payer: Medicare HMO

## 2017-02-27 ENCOUNTER — Ambulatory Visit (INDEPENDENT_AMBULATORY_CARE_PROVIDER_SITE_OTHER): Payer: Medicare HMO | Admitting: Orthopedic Surgery

## 2017-02-27 ENCOUNTER — Encounter (INDEPENDENT_AMBULATORY_CARE_PROVIDER_SITE_OTHER): Payer: Self-pay | Admitting: Orthopedic Surgery

## 2017-02-27 VITALS — BP 146/61 | HR 74 | Resp 17 | Ht 60.0 in | Wt 167.0 lb

## 2017-02-27 DIAGNOSIS — M25561 Pain in right knee: Secondary | ICD-10-CM

## 2017-02-27 DIAGNOSIS — M25562 Pain in left knee: Secondary | ICD-10-CM

## 2017-02-27 DIAGNOSIS — M1711 Unilateral primary osteoarthritis, right knee: Secondary | ICD-10-CM | POA: Diagnosis not present

## 2017-02-27 DIAGNOSIS — G8929 Other chronic pain: Secondary | ICD-10-CM

## 2017-02-27 DIAGNOSIS — M1712 Unilateral primary osteoarthritis, left knee: Secondary | ICD-10-CM | POA: Diagnosis not present

## 2017-02-27 MED ORDER — DICLOFENAC SODIUM 1 % TD GEL: 2.0000 g | Freq: Four times a day (QID) | TRANSDERMAL | 1 refills | Status: DC

## 2017-02-27 NOTE — Progress Notes (Addendum)
Office Visit Note   Patient: Bethany Ray           Date of Birth: 12-Jun-1946           MRN: [id number] Visit Date: 02/27/2017              Requested by: Asencion Noble, MD [address] [place], [place] [address] PCP: Asencion Noble, MD   Assessment & Plan: Visit Diagnoses:  1. Unilateral primary osteoarthritis, right knee   2. Chronic pain of right knee   3. Chronic pain of left knee   4. Unilateral primary osteoarthritis, left knee     Plan:  #1: Since she has recently had a corticosteroid injection I feel it does not appropriate to reinject her. I did check with Dr. Estanislado Pandy on that she concurs with this. #2: Prescription for Voltaren gel was given #3: She'll make an appointment to see Dr. Estanislado Pandy for evaluation.  Time spent was over 1 hour discussing her condition and treatment plans.  Follow-Up Instructions: Return if symptoms worsen or fail to improve.   Orders:  Orders Placed This Encounter  Procedures  . XR Knee Complete 4 Views Right  . XR Knee 1-2 Views Left   Meds ordered this encounter  Medications  . diclofenac sodium (VOLTAREN) 1 % GEL    Sig: Apply 2-4 g topically 4 (four) times daily.    Dispense:  5 Tube    Refill:  1    Order Specific Question:   Supervising Provider    Answer:   Garald Balding [[id number]]      Procedures: No procedures performed   Clinical Data: No additional findings.   Subjective: Chief Complaint  Patient presents with  . Right Knee - Pain, Edema  . Left Knee - Pain, Edema  . Knee Pain    bilateral knee pain and swelling, symptoms worse x 2 weeks. most pain is medial to anterior patella. no fall or injury. in wheelchair today, uses walker at home. very slow moving due to pain difficulty walking. tramadol for pain, not helping. ice helped with swelling. has had knee injections before, 1 injection did not help 2nd injected helped    Bethany Ray is a 71 year old African-American female previous patient of Dr. Charlestine Night  who has recently retired. Apparently was given names of physicians practice rheumatology and she inadvertently made an appointment to see me instead of Dr. Estanislado Pandy. According to her history she does have lupus. She has had cortisone injections in her knees in the past. She had one done about 5-6 weeks ago in her right knee by Dr. Charlestine Night. Did not have much of an effect. She is to the point where she is almost wheelchair bound because of the pain in her knees. Her right is much worse than her left. She has giving way symptoms on the right as she walks and tries to maneuver. She comes in today looking for treatment options.    Review of Systems  Constitutional:       History of lupus.     Objective: Vital Signs: BP (!) 146/61 (BP Location: Left Arm, Patient Position: Sitting, Cuff Size: Normal)   Pulse 74   Resp 17   Ht 5' (1.524 m)   Wt 167 lb (75.8 kg)   BMI 32.61 kg/m   Physical Exam  Constitutional: She is oriented to person, place, and time. She appears well-developed and well-nourished.  HENT:  Head: Normocephalic and atraumatic.  Eyes: EOM are normal. Pupils are  equal, round, and reactive to light.  Pulmonary/Chest: Effort normal.  Neurological: She is alert and oriented to person, place, and time.  Skin: Skin is warm and dry.  Psychiatric: She has a normal mood and affect. Her behavior is normal. Judgment and thought content normal.    Ortho Exam  Exams today of both knees reveal range of motion from about 5 shy of full extension to 90. She has crepitance both patellofemoral and tibiofemoral bilaterally. Effusions are noted bilaterally mild. Little bit of warmth anteriorly. Does have some pseudo-laxity with varus and valgus stressing but good endpoints. Very tender to palpation diffusely about the knee.  Specialty Comments:  No specialty comments available.  Imaging: Xr Knee 1-2 Views Left  Result Date: 02/27/2017 4 view x-rays of the left knee reveals marked joint  space narrowing of the medial compartment with translation femur medially on tibia. Periarticular spurs are noted more on the medial side of the knee. She has some cystic changes in the distal medial femoral condyle and the proximal medial tibial plateau. She has marked patellofemoral degenerative changes. Spurring off of the superior pole patella and inferior pole of patella at the insertion of the tendons.  Xr Knee Complete 4 Views Right  Result Date: 02/27/2017 4 view x-rays of the right knee reveals no medial joint space noted on the 45 flexed. Translated the femur on tibia. Periarticular spurring at the distal needle femoral condyle and proximal medial plateau and tibial plateau. She has cystic changes diffusely throughout the distal femur proximal tibia. Marked patellofemoral OA and spurring is noted also.    PMFS History: Patient Active Problem List   Diagnosis Date Noted  . Unilateral primary osteoarthritis, right knee 02/27/2017  . Unilateral primary osteoarthritis, left knee 02/27/2017  . Breast cancer of lower-inner quadrant of right female breast ([place]) 09/14/2013  . STRESS FRACTURE-SITE 11/19/2007  . ANKLE SPRAIN 11/19/2007   Past Medical History:  Diagnosis Date  . Arthritis   . Breast cancer ([place]) 09/14/2013  . Cancer ([place]) approx 2006   rt breast/lumpectomy/chemo/rad tx  . Diabetes mellitus   . Hypertension   . Shingles     No family history on file.  Past Surgical History:  Procedure Laterality Date  . ABDOMINAL HYSTERECTOMY    . BREAST SURGERY    . CHOLECYSTECTOMY     Social History   Occupational History  . Not on file.   Social History Main Topics  . Smoking status: Never Smoker  . Smokeless tobacco: Never Used  . Alcohol use No  . Drug use: No  . Sexual activity: Not Currently

## 2017-03-06 ENCOUNTER — Other Ambulatory Visit: Payer: Self-pay

## 2017-03-06 DIAGNOSIS — C50311 Malignant neoplasm of lower-inner quadrant of right female breast: Secondary | ICD-10-CM

## 2017-03-06 NOTE — Progress Notes (Signed)
Pt calling to see if she can follow up with the cancer center in [place]. Pt states that she has a lot of physical immobility and needs to stay close to home. Will send in referral to [facility] for pt.

## 2017-03-21 DIAGNOSIS — M170 Bilateral primary osteoarthritis of knee: Secondary | ICD-10-CM | POA: Diagnosis not present

## 2017-03-21 DIAGNOSIS — M0579 Rheumatoid arthritis with rheumatoid factor of multiple sites without organ or systems involvement: Secondary | ICD-10-CM | POA: Diagnosis not present

## 2017-03-21 DIAGNOSIS — D649 Anemia, unspecified: Secondary | ICD-10-CM | POA: Diagnosis not present

## 2017-03-21 DIAGNOSIS — M79643 Pain in unspecified hand: Secondary | ICD-10-CM | POA: Diagnosis not present

## 2017-03-21 DIAGNOSIS — M25569 Pain in unspecified knee: Secondary | ICD-10-CM | POA: Diagnosis not present

## 2017-03-21 DIAGNOSIS — M0589 Other rheumatoid arthritis with rheumatoid factor of multiple sites: Secondary | ICD-10-CM | POA: Diagnosis not present

## 2017-05-02 ENCOUNTER — Encounter (HOSPITAL_COMMUNITY): Payer: Medicare HMO | Attending: Oncology | Admitting: Oncology

## 2017-05-02 ENCOUNTER — Encounter (HOSPITAL_COMMUNITY): Payer: Self-pay

## 2017-05-02 VITALS — BP 141/63 | HR 68 | Resp 16 | Ht 60.0 in | Wt 173.0 lb

## 2017-05-02 DIAGNOSIS — C50111 Malignant neoplasm of central portion of right female breast: Secondary | ICD-10-CM

## 2017-05-02 DIAGNOSIS — Z853 Personal history of malignant neoplasm of breast: Secondary | ICD-10-CM

## 2017-05-02 NOTE — Progress Notes (Signed)
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 female is here because of history of stage II right breast cancer diagnosed 2007. Patient is status post lumpectomy with sentinel lymph node biopsy in June 2007. 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 She received 4 cycles of TEC followed by radiation therapy. All of her therapy was completed in 2007. She took arimidex for 5 years which she completed in December 2012.  Patient has been lost to oncology follow-up. She last saw Dr. Lindi Adie at [facility] back in December 2015. Patient states that she's had a recent L break of shingles. The blisters have all healed however she continues to have neuropathic shingles pain. She also has been having of flare of her rheumatoid arthritis in her legs which have debilitated her for the past 6 weeks. She is currently taking prednisone for her RA and her symptoms are improving. She has not felt any breast masses in her breasts. She denies any shortness of breath, chest pain, abdominal pain, nausea, vomiting, diarrhea, focal weakness. She has occasional shooting pains in her right breast.     Review of Systems  Constitutional: Negative.     HENT:  Negative.   Eyes: Negative.   Respiratory: Negative.   Cardiovascular: Negative.   Gastrointestinal: Negative.   Endocrine: Negative.   Genitourinary: Negative.    Musculoskeletal: Positive for arthralgias (from RA).  Skin:       Shingles rash from her mid back to the underside of her right breast-healed but still painful.  Neurological: Negative.   Hematological: Negative.   Psychiatric/Behavioral: Negative.     MEDICAL HISTORY: Past Medical History:  Diagnosis Date  . Arthritis   . Breast cancer ([place]) 09/14/2013  . Cancer ([place]) approx 2006   rt breast/lumpectomy/chemo/rad tx  . Diabetes mellitus   . Hypertension   . Shingles     SURGICAL HISTORY: Past Surgical History:  Procedure Laterality Date  . ABDOMINAL HYSTERECTOMY    . BREAST SURGERY    . CHOLECYSTECTOMY      SOCIAL HISTORY: Social History   Social History  . Marital status: Married    Spouse name: N/A  . Number of children: N/A  . Years of education: N/A   Occupational History  . Not on file.   Social History Main Topics  . Smoking status: Never Smoker  . Smokeless tobacco: Never Used  . Alcohol use No  . Drug use: No  . Sexual activity: Not Currently   Other Topics Concern  . Not on file   Social History Narrative  . No narrative on file    FAMILY HISTORY No family  history on file.  ALLERGIES:  is allergic to penicillins.  MEDICATIONS:  Current Outpatient Prescriptions  Medication Sig Dispense Refill  . acetaminophen (TYLENOL) 500 MG tablet Take 500 mg by mouth every 6 (six) hours as needed. Pain    . amLODipine (NORVASC) 5 MG tablet Take 5 mg by mouth daily.    Marland Kitchen aspirin EC 81 MG tablet Take 81 mg by mouth daily.    . B Complex-C (B-COMPLEX WITH VITAMIN C) tablet Take 1 tablet by mouth daily.    . Calcium Carbonate (CALTRATE 600 PO) Take 1 tablet by mouth daily.     . Cholecalciferol (VITAMIN D PO) Take 1 tablet by mouth daily.     . diclofenac sodium (VOLTAREN) 1 % GEL  Apply 2-4 g topically 4 (four) times daily. 5 Tube 1  . folic acid (FOLVITE) 1 MG tablet Take 1 mg by mouth daily.    Marland Kitchen glimepiride (AMARYL) 1 MG tablet Take 1 mg by mouth daily with breakfast.     . HYDROcodone-acetaminophen (NORCO) 5-325 MG tablet Take 1 tablet by mouth every 6 (six) hours as needed for severe pain. (Patient not taking: Reported on 02/27/2017) 10 tablet 0  . leflunomide (ARAVA) 20 MG tablet Take 20 mg by mouth daily.    Marland Kitchen losartan-hydrochlorothiazide (HYZAAR) 100-25 MG per tablet Take 1 tablet by mouth daily.    . metFORMIN (GLUCOPHAGE-XR) 500 MG 24 hr tablet Take 500-1,000 mg by mouth 2 (two) times daily. Two tablets in the morning and one tablet in the evening    . methotrexate (RHEUMATREX) 2.5 MG tablet Take 10 mg by mouth 2 (two) times a week. Takes on Wednesdays and Thursdays    . Multiple Vitamin (MULITIVITAMIN WITH MINERALS) TABS Take 1 tablet by mouth daily.    . pravastatin (PRAVACHOL) 20 MG tablet Take 20 mg by mouth every evening.     . sulfaSALAzine (AZULFIDINE) 500 MG tablet Take 500 mg by mouth 2 (two) times daily.    . traMADol (ULTRAM) 50 MG tablet Take 50 mg by mouth 3 (three) times daily as needed for moderate pain.      No current facility-administered medications for this visit.     PHYSICAL EXAMINATION:  ECOG PERFORMANCE STATUS: 1 - Symptomatic but completely ambulatory   Vitals:   05/02/17 1323  BP: (!) 141/63  Pulse: 68  Resp: 16    Filed Weights   05/02/17 1323  Weight: 173 lb (78.5 kg)     Physical Exam  Constitutional: She is oriented to person, place, and time and well-developed, well-nourished, and in no distress. No distress.  HENT:  Head: Normocephalic and atraumatic.  Mouth/Throat: No oropharyngeal exudate.  Eyes: Pupils are equal, round, and reactive to light. Conjunctivae are normal. No scleral icterus.  Neck: Normal range of motion. Neck supple. No JVD present.  Cardiovascular: Normal rate, regular rhythm and normal heart  sounds.  Exam reveals no gallop and no friction rub.   No murmur heard. Pulmonary/Chest: Breath sounds normal. No respiratory distress. She has no wheezes. She has no rales. Right breast exhibits no inverted nipple, no mass, no nipple discharge, no skin change and no tenderness. Left breast exhibits no inverted nipple, no mass, no nipple discharge, no skin change and no tenderness.    Abdominal: Soft. Bowel sounds are normal. She exhibits no distension. There is no tenderness. There is no guarding.  Musculoskeletal: She exhibits no edema or tenderness.  Lymphadenopathy:    She has no cervical adenopathy.  Neurological: She is alert and oriented to person, place, and time. No cranial nerve deficit.  Skin: Skin is warm and dry. No rash noted. No erythema. No pallor.  Healed shingles lesion on her mid back, no open vesicles.  Psychiatric: Affect and judgment normal.     LABORATORY DATA: I have personally reviewed the data as listed:  No visits with results within 1 Month(s) from this visit.  Latest known visit with results is:  Admission on 01/16/2015, Discharged on 01/16/2015  Component Date Value Ref Range Status  . WBC 01/16/2015 11.1* 4.0 - 10.5 K/uL Final  . RBC 01/16/2015 4.07  3.87 - 5.11 MIL/uL Final  . Hemoglobin 01/16/2015 11.1* 12.0 - 15.0 g/dL Final  . HCT 01/16/2015 34.1* 36.0 - 46.0 % Final  . MCV 01/16/2015 83.8  78.0 - 100.0 fL Final  . MCH 01/16/2015 27.3  26.0 - 34.0 pg Final  . MCHC 01/16/2015 32.6  30.0 - 36.0 g/dL Final  . RDW 01/16/2015 17.0* 11.5 - 15.5 % Final  . Platelets 01/16/2015 388  150 - 400 K/uL Final  . Neutrophils Relative % 01/16/2015 73  43 - 77 % Final  . Neutro Abs 01/16/2015 8.1* 1.7 - 7.7 K/uL Final  . Lymphocytes Relative 01/16/2015 19  12 - 46 % Final  . Lymphs Abs 01/16/2015 2.1  0.7 - 4.0 K/uL Final  . Monocytes Relative 01/16/2015 7  3 - 12 % Final  . Monocytes Absolute 01/16/2015 0.7  0.1 - 1.0 K/uL Final  . Eosinophils Relative  01/16/2015 1  0 - 5 % Final  . Eosinophils Absolute 01/16/2015 0.1  0.0 - 0.7 K/uL Final  . Basophils Relative 01/16/2015 0  0 - 1 % Final  . Basophils Absolute 01/16/2015 0.0  0.0 - 0.1 K/uL Final  . Sodium 01/16/2015 142  135 - 145 mmol/L Final  . Potassium 01/16/2015 3.5  3.5 - 5.1 mmol/L Final  . Chloride 01/16/2015 108  96 - 112 mmol/L Final  . CO2 01/16/2015 25  19 - 32 mmol/L Final  . Glucose, Bld 01/16/2015 123* 70 - 99 mg/dL Final  . BUN 01/16/2015 18  6 - 23 mg/dL Final  . Creatinine, Ser 01/16/2015 0.84  0.50 - 1.10 mg/dL Final  . Calcium 01/16/2015 10.1  8.4 - 10.5 mg/dL Final  . GFR calc non Af Amer 01/16/2015 69* >90 mL/min Final  . GFR calc Af Amer 01/16/2015 80* >90 mL/min Final   Comment: (NOTE) The eGFR has been calculated using the CKD EPI equation. This calculation has not been validated in all clinical situations. eGFR's persistently <90 mL/min signify possible Chronic Kidney Disease.   . Anion gap 01/16/2015 9  5 - 15 Final  . Prothrombin Time 01/16/2015 12.8  11.6 - 15.2 seconds Final  . INR 01/16/2015 0.95  0.00 - 1.49 Final    RADIOGRAPHIC STUDIES: I have personally reviewed the radiological images as listed and agree with the findings in the report  No results found.  ASSESSMENT/PLAN ER/PR + HER2 - stage II right breast cancer diagnosed 200, status post lumpectomy with sentinel lymph node biopsy in June 2007. She received 4 cycles of TEC followed by radiation therapy. All of her therapy was completed in 2007. She took arimidex for 5 years which she completed in December 2012.  Clinically NED on exam today. She is due for her yearly mammogram. I have advised her to make an appointment. RTC in 1 year for continued follow up.   All questions  were answered. The patient knows to call the clinic with any problems, questions or concerns.  This note was electronically signed.    Twana First, MD  05/02/2017 1:15 PM

## 2017-10-14 DIAGNOSIS — M170 Bilateral primary osteoarthritis of knee: Secondary | ICD-10-CM | POA: Diagnosis not present

## 2017-10-14 DIAGNOSIS — M25569 Pain in unspecified knee: Secondary | ICD-10-CM | POA: Diagnosis not present

## 2017-10-14 DIAGNOSIS — M79643 Pain in unspecified hand: Secondary | ICD-10-CM | POA: Diagnosis not present

## 2017-10-14 DIAGNOSIS — B029 Zoster without complications: Secondary | ICD-10-CM | POA: Diagnosis not present

## 2017-10-14 DIAGNOSIS — M1711 Unilateral primary osteoarthritis, right knee: Secondary | ICD-10-CM | POA: Diagnosis not present

## 2017-10-14 DIAGNOSIS — Z79899 Other long term (current) drug therapy: Secondary | ICD-10-CM | POA: Diagnosis not present

## 2017-10-14 DIAGNOSIS — M0579 Rheumatoid arthritis with rheumatoid factor of multiple sites without organ or systems involvement: Secondary | ICD-10-CM | POA: Diagnosis not present

## 2017-11-12 DIAGNOSIS — M1711 Unilateral primary osteoarthritis, right knee: Secondary | ICD-10-CM | POA: Diagnosis not present

## 2017-11-12 DIAGNOSIS — M170 Bilateral primary osteoarthritis of knee: Secondary | ICD-10-CM | POA: Diagnosis not present

## 2017-11-12 DIAGNOSIS — M25569 Pain in unspecified knee: Secondary | ICD-10-CM | POA: Diagnosis not present

## 2017-11-12 DIAGNOSIS — B029 Zoster without complications: Secondary | ICD-10-CM | POA: Diagnosis not present

## 2017-11-12 DIAGNOSIS — M79643 Pain in unspecified hand: Secondary | ICD-10-CM | POA: Diagnosis not present

## 2017-11-12 DIAGNOSIS — Z79899 Other long term (current) drug therapy: Secondary | ICD-10-CM | POA: Diagnosis not present

## 2017-11-12 DIAGNOSIS — M0579 Rheumatoid arthritis with rheumatoid factor of multiple sites without organ or systems involvement: Secondary | ICD-10-CM | POA: Diagnosis not present

## 2017-12-09 ENCOUNTER — Emergency Department (HOSPITAL_COMMUNITY): Payer: Medicare HMO

## 2017-12-09 ENCOUNTER — Encounter (HOSPITAL_COMMUNITY): Payer: Self-pay | Admitting: Emergency Medicine

## 2017-12-09 ENCOUNTER — Emergency Department (HOSPITAL_COMMUNITY)
Admission: EM | Admit: 2017-12-09 | Discharge: 2017-12-09 | Disposition: A | Discharge status: Home / Self Care | Payer: Medicare HMO | Attending: Emergency Medicine | Admitting: Emergency Medicine

## 2017-12-09 ENCOUNTER — Other Ambulatory Visit: Payer: Self-pay

## 2017-12-09 DIAGNOSIS — R079 Chest pain, unspecified: Secondary | ICD-10-CM | POA: Diagnosis not present

## 2017-12-09 DIAGNOSIS — R071 Chest pain on breathing: Secondary | ICD-10-CM | POA: Diagnosis not present

## 2017-12-09 DIAGNOSIS — Z79899 Other long term (current) drug therapy: Secondary | ICD-10-CM | POA: Insufficient documentation

## 2017-12-09 DIAGNOSIS — Z7984 Long term (current) use of oral hypoglycemic drugs: Secondary | ICD-10-CM | POA: Insufficient documentation

## 2017-12-09 DIAGNOSIS — E119 Type 2 diabetes mellitus without complications: Secondary | ICD-10-CM | POA: Diagnosis not present

## 2017-12-09 DIAGNOSIS — Z7982 Long term (current) use of aspirin: Secondary | ICD-10-CM | POA: Insufficient documentation

## 2017-12-09 DIAGNOSIS — I10 Essential (primary) hypertension: Secondary | ICD-10-CM | POA: Insufficient documentation

## 2017-12-09 HISTORY — DX: Pure hypercholesterolemia, unspecified: E78.00

## 2017-12-09 LAB — COMPREHENSIVE METABOLIC PANEL
ALT: 24 U/L (ref 14–54)
AST: 20 U/L (ref 15–41)
Albumin: 4.1 g/dL (ref 3.5–5.0)
Alkaline Phosphatase: 101 U/L (ref 38–126)
Anion gap: 12 (ref 5–15)
BUN: 17 mg/dL (ref 6–20)
CO2: 23 mmol/L (ref 22–32)
Calcium: 9.7 mg/dL (ref 8.9–10.3)
Chloride: 106 mmol/L (ref 101–111)
Creatinine, Ser: 0.76 mg/dL (ref 0.44–1.00)
Glucose, Bld: 109 mg/dL — ABNORMAL HIGH (ref 65–99)
POTASSIUM: 4.2 mmol/L (ref 3.5–5.1)
Sodium: 141 mmol/L (ref 135–145)
Total Bilirubin: 0.4 mg/dL (ref 0.3–1.2)
Total Protein: 7.6 g/dL (ref 6.5–8.1)

## 2017-12-09 LAB — D-DIMER, QUANTITATIVE: D-Dimer, Quant: 0.72 ug/mL-FEU — ABNORMAL HIGH (ref 0.00–0.50)

## 2017-12-09 LAB — CBC
HCT: 38.3 % (ref 36.0–46.0)
Hemoglobin: 12.2 g/dL (ref 12.0–15.0)
MCH: 29 pg (ref 26.0–34.0)
MCHC: 31.9 g/dL (ref 30.0–36.0)
MCV: 91.2 fL (ref 78.0–100.0)
PLATELETS: 356 10*3/uL (ref 150–400)
RBC: 4.2 MIL/uL (ref 3.87–5.11)
RDW: 16.7 % — AB (ref 11.5–15.5)
WBC: 13.6 10*3/uL — AB (ref 4.0–10.5)

## 2017-12-09 LAB — TROPONIN I

## 2017-12-09 MED ORDER — TRAMADOL HCL 50 MG PO TABS: ORAL_TABLET | ORAL | 0 refills | Status: DC

## 2017-12-09 MED ORDER — ONDANSETRON HCL 4 MG/2ML IJ SOLN
INTRAMUSCULAR | Status: AC
  Administered 2017-12-09: 4 mg via INTRAVENOUS
  Filled 2017-12-09: qty 2

## 2017-12-09 MED ORDER — OXYCODONE-ACETAMINOPHEN 5-325 MG PO TABS
1.0000 | ORAL_TABLET | Freq: Once | ORAL | Status: AC
  Administered 2017-12-09: 1 via ORAL
  Filled 2017-12-09: qty 1

## 2017-12-09 MED ORDER — ONDANSETRON HCL 4 MG/2ML IJ SOLN
4.0000 mg | Freq: Once | INTRAMUSCULAR | Status: AC
  Administered 2017-12-09: 4 mg via INTRAVENOUS

## 2017-12-09 MED ORDER — IOPAMIDOL (ISOVUE-370) INJECTION 76%
100.0000 mL | Freq: Once | INTRAVENOUS | Status: AC | PRN
  Administered 2017-12-09: 100 mL via INTRAVENOUS

## 2017-12-09 NOTE — Discharge Instructions (Signed)
Follow-up with your family doctor later this week for recheck

## 2017-12-09 NOTE — ED Notes (Signed)
POC I-Stat HCG: <5

## 2017-12-09 NOTE — ED Triage Notes (Signed)
PT c/o middle chest pressure x3 days. PT states she took one 81mg  aspirin this morning. PT denies any SOB.

## 2017-12-09 NOTE — ED Provider Notes (Signed)
[facility] EMERGENCY DEPARTMENT Provider Note   CSN: [id number] Arrival date & time: 12/09/17  1259     History   Chief Complaint Chief Complaint  Patient presents with  . Chest Pain    HPI Bethany Ray is a 72 y.o. female.  Patient complains of chest pain worse with inspiration.   The history is provided by the patient. No language interpreter was used.  Chest Pain   This is a new problem. The current episode started 1 to 2 hours ago. The problem occurs constantly. The problem has not changed since onset.The quality of the pain is described as pleuritic. The pain does not radiate. Pertinent negatives include no abdominal pain, no back pain, no cough and no headaches.  Pertinent negatives for past medical history include no seizures.    Past Medical History:  Diagnosis Date  . Arthritis   . Breast cancer ([place]) 09/14/2013  . Cancer ([place]) approx 2006   rt breast/lumpectomy/chemo/rad tx  . Diabetes mellitus   . High cholesterol   . Hypertension   . Shingles     Patient Active Problem List   Diagnosis Date Noted  . Unilateral primary osteoarthritis, right knee 02/27/2017  . Unilateral primary osteoarthritis, left knee 02/27/2017  . Breast cancer of lower-inner quadrant of right female breast ([place]) 09/14/2013  . STRESS FRACTURE-SITE 11/19/2007  . ANKLE SPRAIN 11/19/2007    Past Surgical History:  Procedure Laterality Date  . ABDOMINAL HYSTERECTOMY    . BREAST SURGERY    . CHOLECYSTECTOMY      OB History    Gravida Para Term Preterm AB Living             2   SAB TAB Ectopic Multiple Live Births                   Home Medications    Prior to Admission medications   Medication Sig Start Date End Date Taking? Authorizing Provider  acetaminophen (TYLENOL) 500 MG tablet Take 500 mg by mouth every 6 (six) hours as needed. Pain   Yes [provider]  amLODipine (NORVASC) 5 MG tablet Take 5 mg by mouth daily.   Yes [provider]  aspirin  EC 81 MG tablet Take 81 mg by mouth daily.   Yes [provider]  B Complex-C (B-COMPLEX WITH VITAMIN C) tablet Take 1 tablet by mouth daily.   Yes [provider]  Calcium Carbonate (CALTRATE 600 PO) Take 1 tablet by mouth daily.    Yes [provider]  folic acid (FOLVITE) 1 MG tablet Take 1 mg by mouth daily.   Yes [provider]  glimepiride (AMARYL) 1 MG tablet Take 1 mg by mouth daily with breakfast.  06/16/16  Yes [provider]  losartan-hydrochlorothiazide (HYZAAR) 100-25 MG per tablet Take 1 tablet by mouth daily.   Yes [provider]  metFORMIN (GLUCOPHAGE-XR) 500 MG 24 hr tablet Take 500 mg by mouth 2 (two) times daily.  08/29/16  Yes [provider]  methotrexate (RHEUMATREX) 2.5 MG tablet Take 10 mg by mouth 2 (two) times a week. Takes on Thursdays and Fridays 08/15/16  Yes [provider]  Multiple Vitamin (MULITIVITAMIN WITH MINERALS) TABS Take 1 tablet by mouth daily.   Yes [provider]  pravastatin (PRAVACHOL) 20 MG tablet Take 20 mg by mouth every evening.    Yes [provider]  predniSONE (DELTASONE) 5 MG tablet Take 1 tablet by mouth daily. 11/28/17  Yes [provider]  traMADol Veatrice Bourbon) 50 MG tablet Take one every 6-8 hours for pain not helped by tylenol 12/09/17   Milton Ferguson, MD    Family History History reviewed. No pertinent family history.  Social History Social History   Tobacco Use  . Smoking status: Never Smoker  . Smokeless tobacco: Never Used  Substance Use Topics  . Alcohol use: No  . Drug use: No     Allergies   Penicillins   Review of Systems Review of Systems  Constitutional: Negative for appetite change and fatigue.  HENT: Negative for congestion, ear discharge and sinus pressure.   Eyes: Negative for discharge.  Respiratory: Negative for cough.   Cardiovascular: Positive for chest pain.  Gastrointestinal: Negative for abdominal pain and  diarrhea.  Genitourinary: Negative for frequency and hematuria.  Musculoskeletal: Negative for back pain.  Skin: Negative for rash.  Neurological: Negative for seizures and headaches.  Psychiatric/Behavioral: Negative for hallucinations.     Physical Exam Updated Vital Signs BP (!) 143/70   Pulse 61   Temp 98.8 F (37.1 C) (Oral)   Resp 16   Ht 5' (1.524 m)   Wt 77.1 kg (170 lb)   SpO2 95%   BMI 33.20 kg/m   Physical Exam  Constitutional: She is oriented to person, place, and time. She appears well-developed.  HENT:  Head: Normocephalic.  Eyes: Conjunctivae and EOM are normal. No scleral icterus.  Neck: Neck supple. No thyromegaly present.  Cardiovascular: Normal rate and regular rhythm. Exam reveals no gallop and no friction rub.  No murmur heard. Pulmonary/Chest: No stridor. She has no wheezes. She has no rales. She exhibits no tenderness.  Abdominal: She exhibits no distension. There is no tenderness. There is no rebound.  Musculoskeletal: Normal range of motion. She exhibits no edema.  Lymphadenopathy:    She has no cervical adenopathy.  Neurological: She is oriented to person, place, and time. She exhibits normal muscle tone. Coordination normal.  Skin: No rash noted. No erythema.  Psychiatric: She has a normal mood and affect. Her behavior is normal.     ED Treatments / Results  Labs (all labs ordered are listed, but only abnormal results are displayed) Labs Reviewed  CBC - Abnormal; Notable for the following components:      Result Value   WBC 13.6 (*)    RDW 16.7 (*)    All other components within normal limits  COMPREHENSIVE METABOLIC PANEL - Abnormal; Notable for the following components:   Glucose, Bld 109 (*)    All other components within normal limits  D-DIMER, QUANTITATIVE (NOT AT [facility]) - Abnormal; Notable for the following components:   D-Dimer, Quant 0.72 (*)    All other components within normal limits  TROPONIN I  TROPONIN I    EKG  EKG  Interpretation  Date/Time:  Monday December 09 2017 13:11:47 EST Ventricular Rate:  56 PR Interval:  146 QRS Duration: 72 QT Interval:  394 QTC Calculation: 380 R Axis:   -24 Text Interpretation:  Sinus bradycardia Moderate voltage criteria for LVH, may be normal variant Borderline ECG Confirmed by Milton Ferguson [phone]) on 12/09/2017 4:42:22 PM       Radiology Dg Chest 2 View  Result Date: 12/09/2017 CLINICAL DATA:  Central chest pain 3-4 days, hypertension, smoker EXAM: CHEST  2 VIEW COMPARISON:  09/02/2016 FINDINGS: Borderline enlargement of cardiac silhouette. Mediastinal contours and pulmonary vascularity normal. Eventration at the lateral RIGHT diaphragm stable. Lungs clear. No acute infiltrate, pleural  effusion or pneumothorax. Mild scattered endplate spur formation thoracic spine. IMPRESSION: No acute abnormalities. Electronically Signed   By: Lavonia Dana M.D.   On: 12/09/2017 14:07   Ct Angio Chest Pe W And/or Wo Contrast  Result Date: 12/09/2017 CLINICAL DATA:  Chest pain EXAM: CT ANGIOGRAPHY CHEST WITH CONTRAST TECHNIQUE: Multidetector CT imaging of the chest was performed using the standard protocol during bolus administration of intravenous contrast. Multiplanar CT image reconstructions and MIPs were obtained to evaluate the vascular anatomy. CONTRAST:  126mL ISOVUE-370 IOPAMIDOL (ISOVUE-370) INJECTION 76% COMPARISON:  Chest radiograph 12/09/2017 FINDINGS: Cardiovascular: Contrast injection is sufficient to demonstrate satisfactory opacification of the pulmonary arteries to the segmental level. There is no pulmonary embolus. The main pulmonary artery is within normal limits for size. There is a normal 3-vessel arch branching pattern without evidence of acute aortic syndrome. There is minimalaortic atherosclerosis. Heart size is normal, without pericardial effusion. Mediastinum/Nodes: Enlarged left thyroid lobe, poorly visualized due to streak artifact. Small hiatal hernia. Lungs/Pleura: 6  mm left lower lobe pulmonary nodule and two 5 mm right middle lobe nodules are unchanged since 11/06/2012 and therefore benign. There is bibasilar subsegmental atelectasis. Upper Abdomen: Contrast bolus timing is not optimized for evaluation of the abdominal organs. Within this limitation, the visualized organs of the upper abdomen are normal. Musculoskeletal: No chest wall abnormality. No acute or significant osseous findings. Review of the MIP images confirms the above findings. IMPRESSION: 1. No pulmonary embolus or other acute thoracic abnormality. 2.  Aortic Atherosclerosis (ICD10-I70.0). Electronically Signed   By: Ulyses Jarred M.D.   On: 12/09/2017 21:53    Procedures Procedures (including critical care time)  Medications Ordered in ED Medications  oxyCODONE-acetaminophen (PERCOCET/ROXICET) 5-325 MG per tablet 1 tablet (1 tablet Oral Given 12/09/17 1734)  iopamidol (ISOVUE-370) 76 % injection 100 mL (100 mLs Intravenous Contrast Given 12/09/17 2122)  ondansetron (ZOFRAN) injection 4 mg (4 mg Intravenous Given 12/09/17 2142)     Initial Impression / Assessment and Plan / ED Course  I have reviewed the triage vital signs and the nursing notes.  Pertinent labs & imaging results that were available during my care of the patient were reviewed by me and considered in my medical decision making (see chart for details). Patient labs unremarkable with 2 normal troponins.  D-dimer was elevated so she had a CT angios that did not show any PE.  Suspect chest pain is noncardiac possible pleuritis patient is given some Ultram will follow up with PCP      Final Clinical Impressions(s) / ED Diagnoses   Final diagnoses:  Chest pain on breathing    ED Discharge Orders        Ordered    traMADol (ULTRAM) 50 MG tablet     12/09/17 2218       Milton Ferguson, MD 12/09/17 2221

## 2017-12-09 NOTE — ED Notes (Signed)
Called lab to check on status of blood work that was ordered at [facility]. Per lab tech unable to obtain lab, note was made by lab tech, but nurse was not informed that blood was not obtained. No other lab tech was available to attempt to stick. Dr zammit notified and pt notified. Pt very upset

## 2018-01-16 DIAGNOSIS — E1129 Type 2 diabetes mellitus with other diabetic kidney complication: Secondary | ICD-10-CM | POA: Diagnosis not present

## 2018-01-23 DIAGNOSIS — I700 Atherosclerosis of aorta: Secondary | ICD-10-CM | POA: Diagnosis not present

## 2018-01-23 DIAGNOSIS — E875 Hyperkalemia: Secondary | ICD-10-CM | POA: Diagnosis not present

## 2018-01-23 DIAGNOSIS — I10 Essential (primary) hypertension: Secondary | ICD-10-CM | POA: Diagnosis not present

## 2018-01-23 DIAGNOSIS — E1129 Type 2 diabetes mellitus with other diabetic kidney complication: Secondary | ICD-10-CM | POA: Diagnosis not present

## 2018-02-06 DIAGNOSIS — B0052 Herpesviral keratitis: Secondary | ICD-10-CM | POA: Diagnosis not present

## 2018-02-13 DIAGNOSIS — B0052 Herpesviral keratitis: Secondary | ICD-10-CM | POA: Diagnosis not present

## 2018-02-14 DIAGNOSIS — M25569 Pain in unspecified knee: Secondary | ICD-10-CM | POA: Diagnosis not present

## 2018-02-14 DIAGNOSIS — M0579 Rheumatoid arthritis with rheumatoid factor of multiple sites without organ or systems involvement: Secondary | ICD-10-CM | POA: Diagnosis not present

## 2018-02-14 DIAGNOSIS — M1711 Unilateral primary osteoarthritis, right knee: Secondary | ICD-10-CM | POA: Diagnosis not present

## 2018-02-14 DIAGNOSIS — M170 Bilateral primary osteoarthritis of knee: Secondary | ICD-10-CM | POA: Diagnosis not present

## 2018-02-14 DIAGNOSIS — Z79899 Other long term (current) drug therapy: Secondary | ICD-10-CM | POA: Diagnosis not present

## 2018-02-14 DIAGNOSIS — M79643 Pain in unspecified hand: Secondary | ICD-10-CM | POA: Diagnosis not present

## 2018-02-27 DIAGNOSIS — B0052 Herpesviral keratitis: Secondary | ICD-10-CM | POA: Diagnosis not present

## 2018-04-04 DIAGNOSIS — B0052 Herpesviral keratitis: Secondary | ICD-10-CM | POA: Diagnosis not present

## 2018-04-18 DIAGNOSIS — Z780 Asymptomatic menopausal state: Secondary | ICD-10-CM | POA: Diagnosis not present

## 2018-04-18 DIAGNOSIS — M0579 Rheumatoid arthritis with rheumatoid factor of multiple sites without organ or systems involvement: Secondary | ICD-10-CM | POA: Diagnosis not present

## 2018-04-18 DIAGNOSIS — Z79899 Other long term (current) drug therapy: Secondary | ICD-10-CM | POA: Diagnosis not present

## 2018-04-18 DIAGNOSIS — M170 Bilateral primary osteoarthritis of knee: Secondary | ICD-10-CM | POA: Diagnosis not present

## 2018-04-18 DIAGNOSIS — M25561 Pain in right knee: Secondary | ICD-10-CM | POA: Diagnosis not present

## 2018-04-18 DIAGNOSIS — M79643 Pain in unspecified hand: Secondary | ICD-10-CM | POA: Diagnosis not present

## 2018-04-18 DIAGNOSIS — Z13820 Encounter for screening for osteoporosis: Secondary | ICD-10-CM | POA: Diagnosis not present

## 2018-04-30 DIAGNOSIS — M5412 Radiculopathy, cervical region: Secondary | ICD-10-CM | POA: Diagnosis not present

## 2018-05-07 ENCOUNTER — Ambulatory Visit (HOSPITAL_COMMUNITY): Payer: Medicare HMO | Admitting: Hematology

## 2018-05-30 DIAGNOSIS — E1140 Type 2 diabetes mellitus with diabetic neuropathy, unspecified: Secondary | ICD-10-CM | POA: Diagnosis not present

## 2018-05-30 DIAGNOSIS — E1129 Type 2 diabetes mellitus with other diabetic kidney complication: Secondary | ICD-10-CM | POA: Diagnosis not present

## 2018-06-04 DIAGNOSIS — Z1231 Encounter for screening mammogram for malignant neoplasm of breast: Secondary | ICD-10-CM | POA: Diagnosis not present

## 2018-06-04 DIAGNOSIS — Z853 Personal history of malignant neoplasm of breast: Secondary | ICD-10-CM | POA: Diagnosis not present

## 2018-06-06 DIAGNOSIS — E1129 Type 2 diabetes mellitus with other diabetic kidney complication: Secondary | ICD-10-CM | POA: Diagnosis not present

## 2018-06-06 DIAGNOSIS — I10 Essential (primary) hypertension: Secondary | ICD-10-CM | POA: Diagnosis not present

## 2018-06-17 DIAGNOSIS — Z853 Personal history of malignant neoplasm of breast: Secondary | ICD-10-CM | POA: Diagnosis not present

## 2018-06-17 DIAGNOSIS — R922 Inconclusive mammogram: Secondary | ICD-10-CM | POA: Diagnosis not present

## 2018-06-19 DIAGNOSIS — M0579 Rheumatoid arthritis with rheumatoid factor of multiple sites without organ or systems involvement: Secondary | ICD-10-CM | POA: Diagnosis not present

## 2018-06-19 DIAGNOSIS — E119 Type 2 diabetes mellitus without complications: Secondary | ICD-10-CM | POA: Diagnosis not present

## 2018-06-19 DIAGNOSIS — Z13820 Encounter for screening for osteoporosis: Secondary | ICD-10-CM | POA: Diagnosis not present

## 2018-06-19 DIAGNOSIS — Z79899 Other long term (current) drug therapy: Secondary | ICD-10-CM | POA: Diagnosis not present

## 2018-06-19 DIAGNOSIS — M170 Bilateral primary osteoarthritis of knee: Secondary | ICD-10-CM | POA: Diagnosis not present

## 2018-06-19 DIAGNOSIS — M79643 Pain in unspecified hand: Secondary | ICD-10-CM | POA: Diagnosis not present

## 2018-06-19 DIAGNOSIS — M25561 Pain in right knee: Secondary | ICD-10-CM | POA: Diagnosis not present

## 2018-07-07 DIAGNOSIS — B0052 Herpesviral keratitis: Secondary | ICD-10-CM | POA: Diagnosis not present

## 2018-07-10 DIAGNOSIS — R05 Cough: Secondary | ICD-10-CM | POA: Diagnosis not present

## 2018-07-23 DIAGNOSIS — E785 Hyperlipidemia, unspecified: Secondary | ICD-10-CM | POA: Diagnosis not present

## 2018-07-23 DIAGNOSIS — E1169 Type 2 diabetes mellitus with other specified complication: Secondary | ICD-10-CM | POA: Diagnosis not present

## 2018-07-23 DIAGNOSIS — M170 Bilateral primary osteoarthritis of knee: Secondary | ICD-10-CM | POA: Diagnosis not present

## 2018-07-23 DIAGNOSIS — M0579 Rheumatoid arthritis with rheumatoid factor of multiple sites without organ or systems involvement: Secondary | ICD-10-CM | POA: Diagnosis not present

## 2018-07-23 DIAGNOSIS — M25562 Pain in left knee: Secondary | ICD-10-CM | POA: Diagnosis not present

## 2018-07-23 DIAGNOSIS — M25561 Pain in right knee: Secondary | ICD-10-CM | POA: Diagnosis not present

## 2018-07-23 DIAGNOSIS — Z79899 Other long term (current) drug therapy: Secondary | ICD-10-CM | POA: Diagnosis not present

## 2018-07-23 DIAGNOSIS — M79643 Pain in unspecified hand: Secondary | ICD-10-CM | POA: Diagnosis not present

## 2018-07-23 DIAGNOSIS — Z13820 Encounter for screening for osteoporosis: Secondary | ICD-10-CM | POA: Diagnosis not present

## 2018-08-08 DIAGNOSIS — Z23 Encounter for immunization: Secondary | ICD-10-CM | POA: Diagnosis not present

## 2018-08-25 DIAGNOSIS — Z01419 Encounter for gynecological examination (general) (routine) without abnormal findings: Secondary | ICD-10-CM | POA: Diagnosis not present

## 2018-09-12 DIAGNOSIS — R05 Cough: Secondary | ICD-10-CM | POA: Diagnosis not present

## 2018-09-22 DIAGNOSIS — Z79899 Other long term (current) drug therapy: Secondary | ICD-10-CM | POA: Diagnosis not present

## 2018-09-22 DIAGNOSIS — M79643 Pain in unspecified hand: Secondary | ICD-10-CM | POA: Diagnosis not present

## 2018-09-22 DIAGNOSIS — E785 Hyperlipidemia, unspecified: Secondary | ICD-10-CM | POA: Diagnosis not present

## 2018-09-22 DIAGNOSIS — M170 Bilateral primary osteoarthritis of knee: Secondary | ICD-10-CM | POA: Diagnosis not present

## 2018-09-22 DIAGNOSIS — E1169 Type 2 diabetes mellitus with other specified complication: Secondary | ICD-10-CM | POA: Diagnosis not present

## 2018-09-22 DIAGNOSIS — Z13820 Encounter for screening for osteoporosis: Secondary | ICD-10-CM | POA: Diagnosis not present

## 2018-09-22 DIAGNOSIS — M25569 Pain in unspecified knee: Secondary | ICD-10-CM | POA: Diagnosis not present

## 2018-09-22 DIAGNOSIS — M0579 Rheumatoid arthritis with rheumatoid factor of multiple sites without organ or systems involvement: Secondary | ICD-10-CM | POA: Diagnosis not present

## 2018-09-25 DIAGNOSIS — E119 Type 2 diabetes mellitus without complications: Secondary | ICD-10-CM | POA: Diagnosis not present

## 2018-09-25 DIAGNOSIS — Z961 Presence of intraocular lens: Secondary | ICD-10-CM | POA: Diagnosis not present

## 2018-10-03 DIAGNOSIS — E1129 Type 2 diabetes mellitus with other diabetic kidney complication: Secondary | ICD-10-CM | POA: Diagnosis not present

## 2018-10-03 DIAGNOSIS — Z79899 Other long term (current) drug therapy: Secondary | ICD-10-CM | POA: Diagnosis not present

## 2018-10-03 DIAGNOSIS — M069 Rheumatoid arthritis, unspecified: Secondary | ICD-10-CM | POA: Diagnosis not present

## 2018-10-03 DIAGNOSIS — I10 Essential (primary) hypertension: Secondary | ICD-10-CM | POA: Diagnosis not present

## 2018-10-09 DIAGNOSIS — E785 Hyperlipidemia, unspecified: Secondary | ICD-10-CM | POA: Diagnosis not present

## 2018-10-09 DIAGNOSIS — I10 Essential (primary) hypertension: Secondary | ICD-10-CM | POA: Diagnosis not present

## 2018-10-09 DIAGNOSIS — E1129 Type 2 diabetes mellitus with other diabetic kidney complication: Secondary | ICD-10-CM | POA: Diagnosis not present

## 2018-10-10 ENCOUNTER — Encounter (INDEPENDENT_AMBULATORY_CARE_PROVIDER_SITE_OTHER): Payer: Self-pay | Admitting: *Deleted

## 2018-10-21 DIAGNOSIS — M25561 Pain in right knee: Secondary | ICD-10-CM | POA: Diagnosis not present

## 2018-10-21 DIAGNOSIS — M25562 Pain in left knee: Secondary | ICD-10-CM | POA: Diagnosis not present

## 2018-11-04 DIAGNOSIS — M1711 Unilateral primary osteoarthritis, right knee: Secondary | ICD-10-CM | POA: Diagnosis not present

## 2018-12-04 DIAGNOSIS — E119 Type 2 diabetes mellitus without complications: Secondary | ICD-10-CM | POA: Diagnosis not present

## 2018-12-04 DIAGNOSIS — R079 Chest pain, unspecified: Secondary | ICD-10-CM | POA: Diagnosis not present

## 2018-12-24 DIAGNOSIS — E1129 Type 2 diabetes mellitus with other diabetic kidney complication: Secondary | ICD-10-CM | POA: Diagnosis not present

## 2018-12-30 DIAGNOSIS — I10 Essential (primary) hypertension: Secondary | ICD-10-CM | POA: Diagnosis not present

## 2018-12-30 DIAGNOSIS — M0600 Rheumatoid arthritis without rheumatoid factor, unspecified site: Secondary | ICD-10-CM | POA: Diagnosis not present

## 2018-12-30 DIAGNOSIS — E1129 Type 2 diabetes mellitus with other diabetic kidney complication: Secondary | ICD-10-CM | POA: Diagnosis not present

## 2019-02-18 DIAGNOSIS — E1169 Type 2 diabetes mellitus with other specified complication: Secondary | ICD-10-CM | POA: Diagnosis not present

## 2019-02-18 DIAGNOSIS — M170 Bilateral primary osteoarthritis of knee: Secondary | ICD-10-CM | POA: Diagnosis not present

## 2019-02-18 DIAGNOSIS — M79643 Pain in unspecified hand: Secondary | ICD-10-CM | POA: Diagnosis not present

## 2019-02-18 DIAGNOSIS — M25569 Pain in unspecified knee: Secondary | ICD-10-CM | POA: Diagnosis not present

## 2019-02-18 DIAGNOSIS — Z79899 Other long term (current) drug therapy: Secondary | ICD-10-CM | POA: Diagnosis not present

## 2019-02-18 DIAGNOSIS — Z13820 Encounter for screening for osteoporosis: Secondary | ICD-10-CM | POA: Diagnosis not present

## 2019-02-18 DIAGNOSIS — M0579 Rheumatoid arthritis with rheumatoid factor of multiple sites without organ or systems involvement: Secondary | ICD-10-CM | POA: Diagnosis not present

## 2019-02-18 DIAGNOSIS — E785 Hyperlipidemia, unspecified: Secondary | ICD-10-CM | POA: Diagnosis not present

## 2019-03-20 DIAGNOSIS — M25569 Pain in unspecified knee: Secondary | ICD-10-CM | POA: Diagnosis not present

## 2019-03-20 DIAGNOSIS — M0579 Rheumatoid arthritis with rheumatoid factor of multiple sites without organ or systems involvement: Secondary | ICD-10-CM | POA: Diagnosis not present

## 2019-03-20 DIAGNOSIS — M170 Bilateral primary osteoarthritis of knee: Secondary | ICD-10-CM | POA: Diagnosis not present

## 2019-03-26 DIAGNOSIS — E1129 Type 2 diabetes mellitus with other diabetic kidney complication: Secondary | ICD-10-CM | POA: Diagnosis not present

## 2019-04-01 DIAGNOSIS — M79643 Pain in unspecified hand: Secondary | ICD-10-CM | POA: Diagnosis not present

## 2019-04-01 DIAGNOSIS — M25569 Pain in unspecified knee: Secondary | ICD-10-CM | POA: Diagnosis not present

## 2019-04-01 DIAGNOSIS — M170 Bilateral primary osteoarthritis of knee: Secondary | ICD-10-CM | POA: Diagnosis not present

## 2019-04-01 DIAGNOSIS — E1169 Type 2 diabetes mellitus with other specified complication: Secondary | ICD-10-CM | POA: Diagnosis not present

## 2019-04-01 DIAGNOSIS — E785 Hyperlipidemia, unspecified: Secondary | ICD-10-CM | POA: Diagnosis not present

## 2019-04-01 DIAGNOSIS — Z13820 Encounter for screening for osteoporosis: Secondary | ICD-10-CM | POA: Diagnosis not present

## 2019-04-01 DIAGNOSIS — Z79899 Other long term (current) drug therapy: Secondary | ICD-10-CM | POA: Diagnosis not present

## 2019-04-01 DIAGNOSIS — M0579 Rheumatoid arthritis with rheumatoid factor of multiple sites without organ or systems involvement: Secondary | ICD-10-CM | POA: Diagnosis not present

## 2019-04-02 DIAGNOSIS — M0600 Rheumatoid arthritis without rheumatoid factor, unspecified site: Secondary | ICD-10-CM | POA: Diagnosis not present

## 2019-04-02 DIAGNOSIS — E1129 Type 2 diabetes mellitus with other diabetic kidney complication: Secondary | ICD-10-CM | POA: Diagnosis not present

## 2019-04-02 DIAGNOSIS — I10 Essential (primary) hypertension: Secondary | ICD-10-CM | POA: Diagnosis not present

## 2019-04-07 ENCOUNTER — Encounter (INDEPENDENT_AMBULATORY_CARE_PROVIDER_SITE_OTHER): Payer: Self-pay | Admitting: *Deleted

## 2019-04-13 DIAGNOSIS — H40053 Ocular hypertension, bilateral: Secondary | ICD-10-CM | POA: Diagnosis not present

## 2019-04-13 DIAGNOSIS — B0052 Herpesviral keratitis: Secondary | ICD-10-CM | POA: Diagnosis not present

## 2019-05-24 NOTE — Progress Notes (Signed)
Subjective:    Patient ID: Bethany Ray, female    DOB: [birth year], 73 y.o.   MRN: [id number]  HPI Cedric Denison. 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 2007 and radiation Nov-Dec 2007 then Arimidex x 5 years. She presents today to schedule a screening colonoscopy as advised by her primary care physician.  She reported having a colonoscopy 10+ years ago by Dr. Laural Golden that was normal. No family history of colon cancer. She denies having any abdominal pain. She reports passing a yellowish brown formed stool once or twice daily, sometimes skips a day. She has occasional loose stool, once weekly. No bloody diarrhea. No specific food triggers. Rare heartburn. No dysphagia. She has gained 10 lbs since restarting Prednisone 5mg  once daily about 6 weeks ago, she takes for rheumatoid arthritis. History of DM II on Glipizide and Metformin. She reports having mid chest pain which occurs 3 to 4 times monthly which lasts for a few minutes, sometimes occurs with exertion and sometimes occurs when sitting. She has occasional shortness of breath but she is not sure if she has SOB during her chest pain episodes. She denies ever seeing a cardiologist. Cardiac risk factors include age, obesity, HTN, hypercholesterolemia and diabetes. Non smoker. No alcohol use.  Past Medical History:  Diagnosis Date  . Arthritis   . Breast cancer ([place]) 09/14/2013  . Cancer ([place]) approx 2006   rt breast/lumpectomy/chemo/rad tx  . Diabetes mellitus   . High cholesterol   . Hypertension   . Shingles    Past Surgical History:  Procedure Laterality Date  . ABDOMINAL HYSTERECTOMY    . BREAST SURGERY    . CHOLECYSTECTOMY     Current Outpatient Medications on File Prior to Visit  Medication Sig Dispense Refill  . acetaminophen (TYLENOL) 500 MG  tablet Take 500 mg by mouth every 6 (six) hours as needed. Pain    . amLODipine (NORVASC) 5 MG tablet Take 5 mg by mouth daily.    Marland Kitchen aspirin EC 81 MG tablet Take 81 mg by mouth daily.    . B Complex-C (B-COMPLEX WITH VITAMIN C) tablet Take 1 tablet by mouth daily.    . Calcium Carbonate (CALTRATE 600 PO) Take 1 tablet by mouth daily.     . folic acid (FOLVITE) 1 MG tablet Take 1 mg by mouth daily.    Marland Kitchen glimepiride (AMARYL) 1 MG tablet Take 1 mg by mouth daily with breakfast.     . losartan-hydrochlorothiazide (HYZAAR) 100-25 MG per tablet Take 1 tablet by mouth daily.    . metFORMIN (GLUCOPHAGE-XR) 500 MG 24 hr tablet Take 500 mg by mouth 2 (two) times daily.     . methotrexate (RHEUMATREX) 2.5 MG tablet Take 10 mg by mouth 2 (two) times a week. Takes on Thursdays and Fridays    . Multiple Vitamin (MULITIVITAMIN WITH MINERALS) TABS Take 1 tablet by mouth daily.    . pravastatin (PRAVACHOL) 20 MG tablet Take 20 mg by mouth every evening.     . predniSONE (DELTASONE) 5 MG tablet Take 1 tablet by mouth daily.     No current facility-administered medications on file prior to visit.    Allergies  Allergen Reactions  . Penicillins Other (See Comments)    Has patient had a PCN reaction causing immediate rash, facial/tongue/throat swelling, SOB or lightheadedness with  hypotension: No Has patient had a PCN reaction causing severe rash involving mucus membranes or skin necrosis: No Has patient had a PCN reaction that required hospitalization No Has patient had a PCN reaction occurring within the last 10 years: Non  If all of the above answers are "NO", then may proceed with Cephalosporin use.   Unknown    Review of Systems  Constitutional: No fever, hot sweats at night few times weekly since menopause HENT: no hearing deficits or congestion Eyes: vision intact  Respiratory: nonproductive cough for "years", no SOB Cardiovascular:  + chest pain 3 times monthly last 1 or 2 min See HPI, no  palpitations or dizziness Gastrointestinal: See HPI,  no dysphagia, infrequent heartburn relieved by drinking water, no upper or lower abdominal pain Genitourinary: no dysuria or hematuria  Musculoskeletal: + bilateral knee pain  Skin: no skin rashes or ulcers Neurological: infrequent headaches, + paresthesia to feet, no dizziness  Psychiatric: no depression or anxiety     Objective:   Physical Exam Blood pressure (!) 154/64, pulse (!) 59, temperature 98.5 F (36.9 C), height 5' (1.524 m), weight 178 lb 8 oz (81 kg). General: 73 y.o. female ambulating with a cane in no acute distress Eyes: Sclera nonicteric, conjunctiva pink Mouth: Dentition intact, no candidiasis or ulcerations Neck: Supple, thyroid feels full, no lymphadenopathy Heart: Regular rate and rhythm, 2/6 systolic murmur Abdomen: Protuberant, soft, nondistended, positive bowel sounds x4 quadrants, no organomegaly, midline scar intact Extremities: Trace edema to the ankles bilaterally Neuro: Alert and oriented x4, right gait impairment, speech is clear    Assessment & Plan:   1. 73 y.o. female due for colon cancer screening  -schedule a colonoscopy after cardiac clearance received   2. Chest Pain, rule out cardiac etiology as patient has multiple risk factors for CAD including age, HTN, DM II, hypercholesterolemia -patient agrees to schedule consult with a cardiologist, obtain cardiac clearance prior to scheduling a colonoscopy   3. Hx of Breast Cancer 2007  4. History of Rheumatoid arthritis on Prednisone and MTX

## 2019-05-25 ENCOUNTER — Other Ambulatory Visit: Payer: Self-pay

## 2019-05-25 ENCOUNTER — Ambulatory Visit (INDEPENDENT_AMBULATORY_CARE_PROVIDER_SITE_OTHER): Payer: Medicare HMO | Admitting: Nurse Practitioner

## 2019-05-25 ENCOUNTER — Encounter (INDEPENDENT_AMBULATORY_CARE_PROVIDER_SITE_OTHER): Payer: Self-pay | Admitting: Nurse Practitioner

## 2019-05-25 DIAGNOSIS — R079 Chest pain, unspecified: Secondary | ICD-10-CM

## 2019-05-25 DIAGNOSIS — Z1211 Encounter for screening for malignant neoplasm of colon: Secondary | ICD-10-CM | POA: Insufficient documentation

## 2019-05-25 NOTE — Patient Instructions (Signed)
1. Please schedule a consult with a cardiologist for cardiac clearance prior to proceeding with a colonoscopy due to your symptoms of chest pain, occasional shortness of breath.   2. Please call our office and let me know when and which cardiologist you will be seeing

## 2019-06-08 DIAGNOSIS — Z853 Personal history of malignant neoplasm of breast: Secondary | ICD-10-CM | POA: Diagnosis not present

## 2019-06-08 DIAGNOSIS — Z1231 Encounter for screening mammogram for malignant neoplasm of breast: Secondary | ICD-10-CM | POA: Diagnosis not present

## 2019-06-10 DIAGNOSIS — E785 Hyperlipidemia, unspecified: Secondary | ICD-10-CM | POA: Diagnosis not present

## 2019-06-10 DIAGNOSIS — M25569 Pain in unspecified knee: Secondary | ICD-10-CM | POA: Diagnosis not present

## 2019-06-10 DIAGNOSIS — M79643 Pain in unspecified hand: Secondary | ICD-10-CM | POA: Diagnosis not present

## 2019-06-10 DIAGNOSIS — Z13820 Encounter for screening for osteoporosis: Secondary | ICD-10-CM | POA: Diagnosis not present

## 2019-06-10 DIAGNOSIS — E1169 Type 2 diabetes mellitus with other specified complication: Secondary | ICD-10-CM | POA: Diagnosis not present

## 2019-06-10 DIAGNOSIS — R700 Elevated erythrocyte sedimentation rate: Secondary | ICD-10-CM | POA: Diagnosis not present

## 2019-06-10 DIAGNOSIS — Z79899 Other long term (current) drug therapy: Secondary | ICD-10-CM | POA: Diagnosis not present

## 2019-06-10 DIAGNOSIS — M170 Bilateral primary osteoarthritis of knee: Secondary | ICD-10-CM | POA: Diagnosis not present

## 2019-06-10 DIAGNOSIS — M0579 Rheumatoid arthritis with rheumatoid factor of multiple sites without organ or systems involvement: Secondary | ICD-10-CM | POA: Diagnosis not present

## 2019-07-01 DIAGNOSIS — E1129 Type 2 diabetes mellitus with other diabetic kidney complication: Secondary | ICD-10-CM | POA: Diagnosis not present

## 2019-07-02 DIAGNOSIS — Z23 Encounter for immunization: Secondary | ICD-10-CM | POA: Diagnosis not present

## 2019-07-08 DIAGNOSIS — M069 Rheumatoid arthritis, unspecified: Secondary | ICD-10-CM | POA: Diagnosis not present

## 2019-07-08 DIAGNOSIS — I10 Essential (primary) hypertension: Secondary | ICD-10-CM | POA: Diagnosis not present

## 2019-07-08 DIAGNOSIS — E1129 Type 2 diabetes mellitus with other diabetic kidney complication: Secondary | ICD-10-CM | POA: Diagnosis not present

## 2019-07-13 ENCOUNTER — Encounter: Payer: Self-pay | Admitting: *Deleted

## 2019-07-13 ENCOUNTER — Other Ambulatory Visit: Payer: Self-pay

## 2019-07-13 ENCOUNTER — Encounter: Payer: Self-pay | Admitting: Cardiovascular Disease

## 2019-07-13 ENCOUNTER — Ambulatory Visit (INDEPENDENT_AMBULATORY_CARE_PROVIDER_SITE_OTHER): Payer: Medicare HMO | Admitting: Cardiovascular Disease

## 2019-07-13 VITALS — BP 136/70 | HR 71 | Ht 60.0 in | Wt 185.0 lb

## 2019-07-13 DIAGNOSIS — E785 Hyperlipidemia, unspecified: Secondary | ICD-10-CM | POA: Diagnosis not present

## 2019-07-13 DIAGNOSIS — R079 Chest pain, unspecified: Secondary | ICD-10-CM

## 2019-07-13 DIAGNOSIS — I10 Essential (primary) hypertension: Secondary | ICD-10-CM | POA: Diagnosis not present

## 2019-07-13 DIAGNOSIS — R011 Cardiac murmur, unspecified: Secondary | ICD-10-CM | POA: Diagnosis not present

## 2019-07-13 NOTE — Progress Notes (Signed)
CARDIOLOGY CONSULT NOTE  Patient ID: Bethany Ray MRN: [id number] DOB/AGE: 11/25/1945 73 y.o.  Admit date: (Not on file) Primary Physician: Asencion Noble, MD  Reason for Consultation: Chest pain  HPI: Bethany Ray is a 73 y.o. female who is being seen today for the evaluation of chest pain at the request of Asencion Noble, MD.   I reviewed records from her PCP.  I reviewed labs dated 03/26/2019: Hemoglobin A1c 6.6%.  Past medical history includes hypertension, hyperlipidemia, and rheumatoid arthritis.  I reviewed CT angiography of the chest performed on 12/09/2017 which showed minimal aortic atherosclerosis.  She has been having episodic upper left-sided chest pains which primarily occur at rest.  She describes them as dull and lasting anywhere from seconds to minutes.  She does describe decreased energy levels over the last several months.  She has some mild shortness of breath but says this has not increased lately.  She has bilateral leg pain, right greater than left.  She has chronic feet swelling.  She also has chronic bilateral arm pain.  She denies associated palpitations, nausea, and vomiting with episodes of chest pain.    Allergies  Allergen Reactions  . Penicillins Other (See Comments)    Has patient had a PCN reaction causing immediate rash, facial/tongue/throat swelling, SOB or lightheadedness with hypotension: No Has patient had a PCN reaction causing severe rash involving mucus membranes or skin necrosis: No Has patient had a PCN reaction that required hospitalization No Has patient had a PCN reaction occurring within the last 10 years: Non  If all of the above answers are "NO", then may proceed with Cephalosporin use.   Unknown    Current Outpatient Medications  Medication Sig Dispense Refill  . acetaminophen (TYLENOL) 500 MG tablet Take 500 mg by mouth every 6 (six) hours as needed. Pain    . amLODipine (NORVASC) 5 MG tablet Take 5 mg by mouth daily.     Marland Kitchen aspirin EC 81 MG tablet Take 81 mg by mouth daily.    . B Complex-C (B-COMPLEX WITH VITAMIN C) tablet Take 1 tablet by mouth daily.    . Calcium Carbonate (CALTRATE 600 PO) Take 1 tablet by mouth daily.     . folic acid (FOLVITE) 1 MG tablet Take 1 mg by mouth daily.    Marland Kitchen glimepiride (AMARYL) 1 MG tablet Take 1 mg by mouth daily with breakfast.     . losartan-hydrochlorothiazide (HYZAAR) 100-25 MG per tablet Take 1 tablet by mouth daily.    . metFORMIN (GLUCOPHAGE-XR) 500 MG 24 hr tablet Take 500 mg by mouth 2 (two) times daily.     . methotrexate (RHEUMATREX) 2.5 MG tablet Take 10 mg by mouth 2 (two) times a week. Takes on Thursdays and Fridays    . Multiple Vitamin (MULITIVITAMIN WITH MINERALS) TABS Take 1 tablet by mouth daily.    . pravastatin (PRAVACHOL) 20 MG tablet Take 20 mg by mouth every evening.     . predniSONE (DELTASONE) 5 MG tablet Take 1 tablet by mouth daily.     No current facility-administered medications for this visit.     Past Medical History:  Diagnosis Date  . Arthritis   . Breast cancer ([place]) 09/14/2013  . Cancer ([place]) approx 2006   rt breast/lumpectomy/chemo/rad tx  . Diabetes mellitus   . High cholesterol   . Hypertension   . Shingles     Past Surgical History:  Procedure Laterality Date  . ABDOMINAL  HYSTERECTOMY    . BREAST SURGERY    . CHOLECYSTECTOMY      Social History   Socioeconomic History  . Marital status: Married    Spouse name: Not on file  . Number of children: Not on file  . Years of education: Not on file  . Highest education level: Not on file  Occupational History  . Not on file  Social Needs  . Financial resource strain: Not on file  . Food insecurity    Worry: Not on file    Inability: Not on file  . Transportation needs    Medical: Not on file    Non-medical: Not on file  Tobacco Use  . Smoking status: Never Smoker  . Smokeless tobacco: Never Used  Substance and Sexual Activity  . Alcohol use: No  . Drug use: No   . Sexual activity: Not Currently  Lifestyle  . Physical activity    Days per week: Not on file    Minutes per session: Not on file  . Stress: Not on file  Relationships  . Social Herbalist on phone: Not on file    Gets together: Not on file    Attends religious service: Not on file    Active member of club or organization: Not on file    Attends meetings of clubs or organizations: Not on file    Relationship status: Not on file  . Intimate partner violence    Fear of current or ex partner: Not on file    Emotionally abused: Not on file    Physically abused: Not on file    Forced sexual activity: Not on file  Other Topics Concern  . Not on file  Social History Narrative  . Not on file     No family history of premature CAD in 1st degree relatives.  Current Meds  Medication Sig  . acetaminophen (TYLENOL) 500 MG tablet Take 500 mg by mouth every 6 (six) hours as needed. Pain  . amLODipine (NORVASC) 5 MG tablet Take 5 mg by mouth daily.  Marland Kitchen aspirin EC 81 MG tablet Take 81 mg by mouth daily.  . B Complex-C (B-COMPLEX WITH VITAMIN C) tablet Take 1 tablet by mouth daily.  . Calcium Carbonate (CALTRATE 600 PO) Take 1 tablet by mouth daily.   . folic acid (FOLVITE) 1 MG tablet Take 1 mg by mouth daily.  Marland Kitchen glimepiride (AMARYL) 1 MG tablet Take 1 mg by mouth daily with breakfast.   . losartan-hydrochlorothiazide (HYZAAR) 100-25 MG per tablet Take 1 tablet by mouth daily.  . metFORMIN (GLUCOPHAGE-XR) 500 MG 24 hr tablet Take 500 mg by mouth 2 (two) times daily.   . methotrexate (RHEUMATREX) 2.5 MG tablet Take 10 mg by mouth 2 (two) times a week. Takes on Thursdays and Fridays  . Multiple Vitamin (MULITIVITAMIN WITH MINERALS) TABS Take 1 tablet by mouth daily.  . pravastatin (PRAVACHOL) 20 MG tablet Take 20 mg by mouth every evening.   . predniSONE (DELTASONE) 5 MG tablet Take 1 tablet by mouth daily.      Review of systems complete and found to be negative unless listed  above in HPI    Physical exam Blood pressure 136/70, pulse 71, height 5' (1.524 m), weight 185 lb (83.9 kg), SpO2 95 %. General: NAD Neck: No JVD, no thyromegaly or thyroid nodule.  Lungs: Clear to auscultation bilaterally with normal respiratory effort. CV: Nondisplaced PMI. Regular rate and rhythm, normal S1/S2, no S3/S4,  1/6 systolic murmur over right upper sternal border.  Trace bilateral peri-ankle edema.  No carotid bruit.  Abdomen: Soft, nontender, no distention.  Skin: Intact without lesions or rashes.  Neurologic: Alert and oriented x 3.  Psych: Normal affect. Extremities: No clubbing or cyanosis.  HEENT: Normal.   ECG: Most recent ECG reviewed.   Labs: Lab Results  Component Value Date/Time   K 4.2 12/09/2017 01:48 PM   K 3.8 09/16/2014 02:12 PM   BUN 17 12/09/2017 01:48 PM   BUN 16.7 09/16/2014 02:12 PM   CREATININE 0.76 12/09/2017 01:48 PM   CREATININE 0.8 09/16/2014 02:12 PM   ALT 24 12/09/2017 01:48 PM   ALT 25 09/16/2014 02:12 PM   HGB 12.2 12/09/2017 01:48 PM   HGB 11.1 (L) 09/16/2014 02:12 PM     Lipids: No results found for: LDLCALC, LDLDIRECT, CHOL, TRIG, HDL      ASSESSMENT AND PLAN:   1.  Chest pain: Symptoms appear somewhat atypical.  She certainly has several cardiovascular risk factors.  I will proceed with a nuclear myocardial perfusion imaging study to evaluate for ischemic heart disease (Lexiscan Myoview).  2. Hypertension: Controlled on present therapy.  No changes.  3.  Hyperlipidemia: Continue statin therapy.  4.  Cardiac murmur: I will order a 2-D echocardiogram with Doppler to evaluate cardiac structure, function, and regional wall motion.    Disposition: Follow up in 3 months  Signed: Kate Sable, M.D., F.A.C.C.  07/13/2019, 10:25 AM

## 2019-07-13 NOTE — Patient Instructions (Signed)
Medication Instructions:  Continue all current medications.  Labwork: none  Testing/Procedures:  Your physician has requested that you have a lexiscan myoview. For further information please visit [email or web address]. Please follow instruction sheet, as given.  Your physician has requested that you have an echocardiogram. Echocardiography is a painless test that uses sound waves to create images of your heart. It provides your doctor with information about the size and shape of your heart and how well your heart's chambers and valves are working. This procedure takes approximately one hour. There are no restrictions for this procedure.  Office will contact with results via phone or letter.    Follow-Up: 3 months   Any Other Special Instructions Will Be Listed Below (If Applicable).  If you need a refill on your cardiac medications before your next appointment, please call your pharmacy.  

## 2019-07-14 ENCOUNTER — Telehealth: Payer: Self-pay | Admitting: Cardiovascular Disease

## 2019-07-14 NOTE — Telephone Encounter (Signed)
Pre-cert Verification for the following procedure    Lexiscan myoview scheduled for 07-20-2019 at [facility]

## 2019-07-20 ENCOUNTER — Encounter (HOSPITAL_COMMUNITY): Payer: Medicare HMO

## 2019-07-21 ENCOUNTER — Telehealth: Payer: Self-pay | Admitting: Cardiovascular Disease

## 2019-07-21 NOTE — Telephone Encounter (Signed)
Cardiologist for [facility]  Asking to speak with you in regards to her [place] - test schedule for tomorrow Oct 14. This has not been approved by her insuracne

## 2019-07-22 ENCOUNTER — Encounter (HOSPITAL_COMMUNITY): Payer: Medicare HMO

## 2019-07-22 ENCOUNTER — Ambulatory Visit (HOSPITAL_COMMUNITY)
Admission: RE | Admit: 2019-07-22 | Discharge: 2019-07-22 | Disposition: A | Discharge status: Home / Self Care | Payer: Medicare HMO | Source: Ambulatory Visit | Attending: Cardiovascular Disease | Admitting: Cardiovascular Disease

## 2019-07-22 ENCOUNTER — Other Ambulatory Visit: Payer: Self-pay

## 2019-07-22 DIAGNOSIS — R011 Cardiac murmur, unspecified: Secondary | ICD-10-CM

## 2019-07-22 NOTE — Progress Notes (Signed)
*  PRELIMINARY RESULTS* Echocardiogram 2D Echocardiogram has been performed.  Samuel Germany 07/22/2019, 12:10 PM

## 2019-07-24 ENCOUNTER — Telehealth: Payer: Self-pay | Admitting: *Deleted

## 2019-07-24 NOTE — Telephone Encounter (Signed)
Called patient with test results. No answer. Left message to call back.  

## 2019-07-24 NOTE — Telephone Encounter (Signed)
-----   Message from Herminio Commons, MD sent at 07/22/2019  1:11 PM EDT ----- Very good pumping function.

## 2019-07-27 ENCOUNTER — Telehealth: Payer: Self-pay

## 2019-07-27 DIAGNOSIS — R079 Chest pain, unspecified: Secondary | ICD-10-CM

## 2019-07-27 NOTE — Telephone Encounter (Signed)
-----   Message from Orinda Kenner sent at 07/27/2019 11:15 AM EDT ----- Regarding: Myoview - Stress Echo This patient's insurance denied myoview. Needs to be changed to stress echo. Can you check to see if this is okay and if so, place the order?  Thanks, [employer]

## 2019-07-27 NOTE — Telephone Encounter (Signed)
Will forward to [facility] triage.

## 2019-07-27 NOTE — Telephone Encounter (Signed)
Order changed to stress echo

## 2019-07-27 NOTE — Telephone Encounter (Signed)
That is fine 

## 2019-07-28 ENCOUNTER — Ambulatory Visit (HOSPITAL_COMMUNITY): Payer: Medicare HMO | Attending: Cardiovascular Disease

## 2019-07-28 ENCOUNTER — Encounter (HOSPITAL_COMMUNITY): Payer: Medicare HMO

## 2019-08-06 ENCOUNTER — Inpatient Hospital Stay (HOSPITAL_COMMUNITY): Admission: RE | Admit: 2019-08-06 | Payer: Medicare HMO | Source: Ambulatory Visit

## 2019-08-11 ENCOUNTER — Other Ambulatory Visit: Payer: Self-pay | Admitting: *Deleted

## 2019-08-11 DIAGNOSIS — R0789 Other chest pain: Secondary | ICD-10-CM

## 2019-09-08 ENCOUNTER — Telehealth (HOSPITAL_COMMUNITY): Payer: Self-pay | Admitting: Emergency Medicine

## 2019-09-08 NOTE — Telephone Encounter (Signed)
Reaching out to patient to offer assistance regarding upcoming cardiac imaging study; pt verbalizes understanding of appt date/time, parking situation and where to check in, pre-test NPO status and medications ordered, and verified current allergies; name and call back number provided for further questions should they arise Maija Biggers RN Navigator Cardiac Imaging [facility] [phone] office [phone] cell 

## 2019-09-09 ENCOUNTER — Ambulatory Visit (HOSPITAL_COMMUNITY)
Admission: RE | Admit: 2019-09-09 | Discharge: 2019-09-09 | Disposition: A | Discharge status: Home / Self Care | Payer: Medicare HMO | Source: Ambulatory Visit | Attending: Cardiovascular Disease | Admitting: Cardiovascular Disease

## 2019-09-09 ENCOUNTER — Other Ambulatory Visit: Payer: Self-pay

## 2019-09-09 DIAGNOSIS — R0789 Other chest pain: Secondary | ICD-10-CM | POA: Insufficient documentation

## 2019-09-09 LAB — POCT I-STAT CREATININE: Creatinine, Ser: 0.7 mg/dL (ref 0.44–1.00)

## 2019-09-09 MED ORDER — NITROGLYCERIN 0.4 MG SL SUBL: 0.8000 mg | SUBLINGUAL_TABLET | Freq: Once | SUBLINGUAL | Status: DC

## 2019-09-09 NOTE — Progress Notes (Signed)
Two IV team nurses attempted with no success IV insertion.  Sarah (CT Navigator) notified that we would be rescheduling patient.  Patient verbalized understanding for need to reschedule.  Patient was very pleasant and given water and snacks prior to leaving.  Patient walked to waiting room with this RN with stable gait (use of a cane).

## 2019-09-10 ENCOUNTER — Telehealth: Payer: Self-pay | Admitting: *Deleted

## 2019-09-10 DIAGNOSIS — M170 Bilateral primary osteoarthritis of knee: Secondary | ICD-10-CM | POA: Diagnosis not present

## 2019-09-10 DIAGNOSIS — Z79899 Other long term (current) drug therapy: Secondary | ICD-10-CM | POA: Diagnosis not present

## 2019-09-10 DIAGNOSIS — M25561 Pain in right knee: Secondary | ICD-10-CM | POA: Diagnosis not present

## 2019-09-10 DIAGNOSIS — R700 Elevated erythrocyte sedimentation rate: Secondary | ICD-10-CM | POA: Diagnosis not present

## 2019-09-10 DIAGNOSIS — Z13820 Encounter for screening for osteoporosis: Secondary | ICD-10-CM | POA: Diagnosis not present

## 2019-09-10 DIAGNOSIS — M0579 Rheumatoid arthritis with rheumatoid factor of multiple sites without organ or systems involvement: Secondary | ICD-10-CM | POA: Diagnosis not present

## 2019-09-10 DIAGNOSIS — E1169 Type 2 diabetes mellitus with other specified complication: Secondary | ICD-10-CM | POA: Diagnosis not present

## 2019-09-10 DIAGNOSIS — M79643 Pain in unspecified hand: Secondary | ICD-10-CM | POA: Diagnosis not present

## 2019-09-10 DIAGNOSIS — E785 Hyperlipidemia, unspecified: Secondary | ICD-10-CM | POA: Diagnosis not present

## 2019-09-10 NOTE — Telephone Encounter (Signed)
-----   Message from Herminio Commons, MD sent at 09/09/2019  3:23 PM EST ----- normal

## 2019-09-10 NOTE — Telephone Encounter (Signed)
Called patient with test results. No answer. Left message to call back.  

## 2019-09-18 NOTE — Telephone Encounter (Signed)
Called pt. No answer, left message for pt to return call.  

## 2019-09-18 NOTE — Telephone Encounter (Signed)
Left message returning call

## 2019-09-21 ENCOUNTER — Telehealth: Payer: Self-pay

## 2019-09-21 NOTE — Telephone Encounter (Signed)
It is already being rescheduled.

## 2019-09-21 NOTE — Telephone Encounter (Signed)
Please advise 

## 2019-09-21 NOTE — Telephone Encounter (Signed)
Pt states that she was not able to get her Cardiac CT as they could not get her IV started. She states it needs to be rescheduled. I will forward to [facility] office.

## 2019-09-21 NOTE — Telephone Encounter (Signed)
Noted  

## 2019-09-28 DIAGNOSIS — I10 Essential (primary) hypertension: Secondary | ICD-10-CM | POA: Diagnosis not present

## 2019-09-28 DIAGNOSIS — Z79899 Other long term (current) drug therapy: Secondary | ICD-10-CM | POA: Diagnosis not present

## 2019-09-28 DIAGNOSIS — M069 Rheumatoid arthritis, unspecified: Secondary | ICD-10-CM | POA: Diagnosis not present

## 2019-09-28 DIAGNOSIS — E1129 Type 2 diabetes mellitus with other diabetic kidney complication: Secondary | ICD-10-CM | POA: Diagnosis not present

## 2019-09-29 ENCOUNTER — Other Ambulatory Visit: Payer: Self-pay | Admitting: *Deleted

## 2019-09-29 DIAGNOSIS — R079 Chest pain, unspecified: Secondary | ICD-10-CM

## 2019-10-08 DIAGNOSIS — I10 Essential (primary) hypertension: Secondary | ICD-10-CM | POA: Diagnosis not present

## 2019-10-08 DIAGNOSIS — E1129 Type 2 diabetes mellitus with other diabetic kidney complication: Secondary | ICD-10-CM | POA: Diagnosis not present

## 2019-10-08 DIAGNOSIS — E785 Hyperlipidemia, unspecified: Secondary | ICD-10-CM | POA: Diagnosis not present

## 2019-10-29 ENCOUNTER — Other Ambulatory Visit: Payer: Self-pay | Admitting: *Deleted

## 2019-10-29 DIAGNOSIS — R0609 Other forms of dyspnea: Secondary | ICD-10-CM

## 2019-10-29 DIAGNOSIS — R0600 Dyspnea, unspecified: Secondary | ICD-10-CM

## 2019-10-29 DIAGNOSIS — Z01812 Encounter for preprocedural laboratory examination: Secondary | ICD-10-CM

## 2019-11-03 ENCOUNTER — Ambulatory Visit (INDEPENDENT_AMBULATORY_CARE_PROVIDER_SITE_OTHER): Payer: Medicare HMO | Admitting: Cardiovascular Disease

## 2019-11-03 ENCOUNTER — Other Ambulatory Visit: Payer: Self-pay

## 2019-11-03 ENCOUNTER — Encounter: Payer: Self-pay | Admitting: Cardiovascular Disease

## 2019-11-03 VITALS — BP 154/69 | HR 69 | Temp 98.1°F | Ht 60.0 in | Wt 188.0 lb

## 2019-11-03 DIAGNOSIS — R0609 Other forms of dyspnea: Secondary | ICD-10-CM

## 2019-11-03 DIAGNOSIS — E785 Hyperlipidemia, unspecified: Secondary | ICD-10-CM | POA: Diagnosis not present

## 2019-11-03 DIAGNOSIS — R011 Cardiac murmur, unspecified: Secondary | ICD-10-CM | POA: Diagnosis not present

## 2019-11-03 DIAGNOSIS — R079 Chest pain, unspecified: Secondary | ICD-10-CM | POA: Diagnosis not present

## 2019-11-03 DIAGNOSIS — I10 Essential (primary) hypertension: Secondary | ICD-10-CM

## 2019-11-03 DIAGNOSIS — R0600 Dyspnea, unspecified: Secondary | ICD-10-CM

## 2019-11-03 NOTE — Patient Instructions (Signed)
Medication Instructions:  Your physician recommends that you continue on your current medications as directed. Please refer to the Current Medication list given to you today.  *If you need a refill on your cardiac medications before your next appointment, please call your pharmacy*  Lab Work: NONE If you have labs (blood work) drawn today and your tests are completely normal, you will receive your results only by: Marland Kitchen MyChart Message (if you have MyChart) OR . A paper copy in the mail If you have any lab test that is abnormal or we need to change your treatment, we will call you to review the results.  Testing/Procedures: NONe  Follow-Up: At [facility], you and your health needs are our priority.  As part of our continuing mission to provide you with exceptional heart care, we have created designated Provider Care Teams.  These Care Teams include your primary Cardiologist (physician) and Advanced Practice Providers (APPs -  Physician Assistants and Nurse Practitioners) who all work together to provide you with the care you need, when you need it.  Your next appointment:   3 month(s)  The format for your next appointment:   Virtual Visit   Provider:   Kate Sable, MD  Other Instructions NONE      Thank you for choosing [facility] !

## 2019-11-03 NOTE — Progress Notes (Signed)
SUBJECTIVE: The patient presents for follow-up of chest pain.  They were unable to start an IV so her cardiac CT has been rescheduled for February 10.  Insurance would not pay for a Lexiscan.  She has a history of hypertension, hyperlipidemia, and rheumatoid arthritis.  She has seldom had upper left-sided chest pains.  She has chronic exertional dyspnea which appears to be stable.  She denies palpitations and dizziness.  She does feel fatigued.  She said her husband is told her she snores "a little bit ".  She denies morning headaches.    Review of Systems: As per "subjective", otherwise negative.  Allergies  Allergen Reactions  . Penicillins Other (See Comments)    Has patient had a PCN reaction causing immediate rash, facial/tongue/throat swelling, SOB or lightheadedness with hypotension: No Has patient had a PCN reaction causing severe rash involving mucus membranes or skin necrosis: No Has patient had a PCN reaction that required hospitalization No Has patient had a PCN reaction occurring within the last 10 years: Non  If all of the above answers are "NO", then may proceed with Cephalosporin use.   Unknown    Current Outpatient Medications  Medication Sig Dispense Refill  . acetaminophen (TYLENOL) 500 MG tablet Take 500 mg by mouth every 6 (six) hours as needed. Pain    . amLODipine (NORVASC) 5 MG tablet Take 5 mg by mouth daily.    Marland Kitchen aspirin EC 81 MG tablet Take 81 mg by mouth daily.    . B Complex-C (B-COMPLEX WITH VITAMIN C) tablet Take 1 tablet by mouth daily.    . Calcium Carbonate (CALTRATE 600 PO) Take 1 tablet by mouth daily.     . folic acid (FOLVITE) 1 MG tablet Take 1 mg by mouth daily.    Marland Kitchen glimepiride (AMARYL) 1 MG tablet Take 1 mg by mouth daily with breakfast.     . losartan-hydrochlorothiazide (HYZAAR) 100-25 MG per tablet Take 1 tablet by mouth daily.    . metFORMIN (GLUCOPHAGE-XR) 500 MG 24 hr tablet Take 500 mg by mouth 2 (two) times daily.     .  methotrexate (RHEUMATREX) 2.5 MG tablet Take 10 mg by mouth 2 (two) times a week. Takes on Thursdays and Fridays    . Multiple Vitamin (MULITIVITAMIN WITH MINERALS) TABS Take 1 tablet by mouth daily.    . pravastatin (PRAVACHOL) 20 MG tablet Take 20 mg by mouth every evening.     . predniSONE (DELTASONE) 5 MG tablet Take 1 tablet by mouth daily.     No current facility-administered medications for this visit.    Past Medical History:  Diagnosis Date  . Arthritis   . Breast cancer ([place]) 09/14/2013  . Cancer ([place]) approx 2006   rt breast/lumpectomy/chemo/rad tx  . Diabetes mellitus   . High cholesterol   . Hypertension   . Shingles     Past Surgical History:  Procedure Laterality Date  . ABDOMINAL HYSTERECTOMY    . BREAST SURGERY    . CHOLECYSTECTOMY      Social History   Socioeconomic History  . Marital status: Married    Spouse name: Not on file  . Number of children: Not on file  . Years of education: Not on file  . Highest education level: Not on file  Occupational History  . Not on file  Tobacco Use  . Smoking status: Never Smoker  . Smokeless tobacco: Never Used  Substance and Sexual Activity  . Alcohol use:  No  . Drug use: No  . Sexual activity: Not Currently  Other Topics Concern  . Not on file  Social History Narrative  . Not on file   Social Determinants of Health   Financial Resource Strain:   . Difficulty of Paying Living Expenses: Not on file  Food Insecurity:   . Worried About Charity fundraiser in the Last Year: Not on file  . Ran Out of Food in the Last Year: Not on file  Transportation Needs:   . Lack of Transportation (Medical): Not on file  . Lack of Transportation (Non-Medical): Not on file  Physical Activity:   . Days of Exercise per Week: Not on file  . Minutes of Exercise per Session: Not on file  Stress:   . Feeling of Stress : Not on file  Social Connections:   . Frequency of Communication with Friends and Family: Not on file  .  Frequency of Social Gatherings with Friends and Family: Not on file  . Attends Religious Services: Not on file  . Active Member of Clubs or Organizations: Not on file  . Attends Archivist Meetings: Not on file  . Marital Status: Not on file  Intimate Partner Violence:   . Fear of Current or Ex-Partner: Not on file  . Emotionally Abused: Not on file  . Physically Abused: Not on file  . Sexually Abused: Not on file     Vitals:   11/03/19 1435  BP: (!) 154/69  Pulse: 69  Temp: 98.1 F (36.7 C)  SpO2: 98%  Weight: 188 lb (85.3 kg)  Height: 5' (1.524 m)    Wt Readings from Last 3 Encounters:  11/03/19 188 lb (85.3 kg)  07/13/19 185 lb (83.9 kg)  05/25/19 178 lb 8 oz (81 kg)     PHYSICAL EXAM General: NAD HEENT: Normal. Neck: No JVD, no thyromegaly. Lungs: Clear to auscultation bilaterally with normal respiratory effort. CV: Regular rate and rhythm, normal S1/S2, no [id number], 1/6 systolic murmur over right upper sternal border.  Trace bilateral peri-ankle edema. No carotid bruit.   Abdomen: Soft, nontender, no distention.  Neurologic: Alert and oriented.  Psych: Normal affect. Skin: Normal. Musculoskeletal: No gross deformities.      Labs: Lab Results  Component Value Date/Time   K 4.2 12/09/2017 01:48 PM   K 3.8 09/16/2014 02:12 PM   BUN 17 12/09/2017 01:48 PM   BUN 16.7 09/16/2014 02:12 PM   CREATININE 0.70 09/09/2019 12:30 PM   CREATININE 0.8 09/16/2014 02:12 PM   ALT 24 12/09/2017 01:48 PM   ALT 25 09/16/2014 02:12 PM   HGB 12.2 12/09/2017 01:48 PM   HGB 11.1 (L) 09/16/2014 02:12 PM     Lipids: No results found for: LDLCALC, LDLDIRECT, CHOL, TRIG, HDL     Echocardiogram 07/22/2019:   1. Left ventricular ejection fraction, by visual estimation, is 70 to 75%. The left ventricle has hyperdynamic function. Normal left ventricular size. There is mildly increased left ventricular hypertrophy.  2. Global right ventricle has normal systolic  function.The right ventricular size is normal. No increase in right ventricular wall thickness.  3. Left atrial size was normal.  4. Right atrial size was normal.  5. Mild aortic valve annular calcification.  6. Mild mitral annular calcification.  7. The mitral valve is grossly normal. Trace mitral valve regurgitation.  8. The tricuspid valve is grossly normal. Tricuspid valve regurgitation is trivial.  9. The aortic valve is tricuspid Aortic valve regurgitation was not  visualized by color flow Doppler. 10. The pulmonic valve was grossly normal. Pulmonic valve regurgitation is trivial by color flow Doppler. 11. Mildly elevated pulmonary artery systolic pressure. 12. The tricuspid regurgitant velocity is 2.93 m/s, and with an assumed right atrial pressure of 3 mmHg, the estimated right ventricular systolic pressure is mildly elevated at 37.3 mmHg. 13. The inferior vena cava is normal in size with greater than 50% respiratory variability, suggesting right atrial pressure of 3 mmHg.    ASSESSMENT AND PLAN:  1.  Chest pain: Seldom in occurrence.  Insurance would not cover a [employer].  Coronary CT angiography was arranged but they were unable to start an IV.  This has been rescheduled for February 10.  2.  Hypertension: Blood pressure is elevated today but normal at last visit.  No changes today.  This will need further monitoring.  3.  Hyperlipidemia: Continue statin therapy.  4.  Cardiac murmur: Echocardiogram on 07/22/2019 demonstrated vigorous LV systolic function, EF [id number].  This is likely the reason for the murmur.  There was no significant valvular regurgitation.   Disposition: Follow up virtual visit 3 months   Kate Sable, M.D., F.A.C.C.

## 2019-11-13 DIAGNOSIS — B0052 Herpesviral keratitis: Secondary | ICD-10-CM | POA: Diagnosis not present

## 2019-11-13 DIAGNOSIS — H401111 Primary open-angle glaucoma, right eye, mild stage: Secondary | ICD-10-CM | POA: Diagnosis not present

## 2019-11-13 DIAGNOSIS — R0600 Dyspnea, unspecified: Secondary | ICD-10-CM | POA: Diagnosis not present

## 2019-11-13 DIAGNOSIS — Z01812 Encounter for preprocedural laboratory examination: Secondary | ICD-10-CM | POA: Diagnosis not present

## 2019-11-17 ENCOUNTER — Telehealth (HOSPITAL_COMMUNITY): Payer: Self-pay | Admitting: Emergency Medicine

## 2019-11-17 ENCOUNTER — Other Ambulatory Visit (HOSPITAL_COMMUNITY): Payer: Self-pay | Admitting: Cardiovascular Disease

## 2019-11-17 DIAGNOSIS — Z789 Other specified health status: Secondary | ICD-10-CM

## 2019-11-17 NOTE — Telephone Encounter (Signed)
Reaching out to patient to offer assistance regarding upcoming cardiac imaging study; pt verbalizes understanding of appt date/time, parking situation and where to check in, pre-test NPO status and medications ordered, and verified current allergies; name and call back number provided for further questions should they arise Marchia Bond RN Navigator Cardiac Imaging [place] [facility] [phone] office [phone] cell  Pt verbalized understanding to hold HCTZ and diabetes medications prior to scan. Pt also verbalized understanding to check in at 7a for her micropuncture appt.

## 2019-11-18 ENCOUNTER — Ambulatory Visit (HOSPITAL_COMMUNITY)
Admission: RE | Admit: 2019-11-18 | Discharge: 2019-11-18 | Disposition: A | Discharge status: Home / Self Care | Payer: Medicare HMO | Source: Ambulatory Visit | Attending: Cardiovascular Disease | Admitting: Cardiovascular Disease

## 2019-11-18 ENCOUNTER — Other Ambulatory Visit: Payer: Self-pay

## 2019-11-18 ENCOUNTER — Other Ambulatory Visit (HOSPITAL_COMMUNITY): Payer: Self-pay | Admitting: Cardiovascular Disease

## 2019-11-18 ENCOUNTER — Encounter: Payer: Medicare HMO | Admitting: *Deleted

## 2019-11-18 ENCOUNTER — Encounter (HOSPITAL_COMMUNITY): Payer: Self-pay

## 2019-11-18 DIAGNOSIS — K760 Fatty (change of) liver, not elsewhere classified: Secondary | ICD-10-CM | POA: Insufficient documentation

## 2019-11-18 DIAGNOSIS — Z006 Encounter for examination for normal comparison and control in clinical research program: Secondary | ICD-10-CM

## 2019-11-18 DIAGNOSIS — R918 Other nonspecific abnormal finding of lung field: Secondary | ICD-10-CM | POA: Diagnosis not present

## 2019-11-18 DIAGNOSIS — I872 Venous insufficiency (chronic) (peripheral): Secondary | ICD-10-CM | POA: Diagnosis not present

## 2019-11-18 DIAGNOSIS — R079 Chest pain, unspecified: Secondary | ICD-10-CM

## 2019-11-18 DIAGNOSIS — R0609 Other forms of dyspnea: Secondary | ICD-10-CM | POA: Diagnosis not present

## 2019-11-18 DIAGNOSIS — K449 Diaphragmatic hernia without obstruction or gangrene: Secondary | ICD-10-CM | POA: Insufficient documentation

## 2019-11-18 DIAGNOSIS — Z789 Other specified health status: Secondary | ICD-10-CM

## 2019-11-18 HISTORY — PX: IR RADIOLOGY PERIPHERAL GUIDED IV START: IMG5598

## 2019-11-18 HISTORY — PX: IR US GUIDE VASC ACCESS RIGHT: IMG2390

## 2019-11-18 LAB — POCT I-STAT, CHEM 8
BUN: 17 mg/dL (ref 8–23)
Calcium, Ion: 1.26 mmol/L (ref 1.15–1.40)
Chloride: 102 mmol/L (ref 98–111)
Creatinine, Ser: 0.7 mg/dL (ref 0.44–1.00)
Glucose, Bld: 156 mg/dL — ABNORMAL HIGH (ref 70–99)
HCT: 37 % (ref 36.0–46.0)
Hemoglobin: 12.6 g/dL (ref 12.0–15.0)
Potassium: 3.6 mmol/L (ref 3.5–5.1)
Sodium: 140 mmol/L (ref 135–145)
TCO2: 24 mmol/L (ref 22–32)

## 2019-11-18 LAB — BASIC METABOLIC PANEL
BUN/Creatinine Ratio: 22 (ref 12–28)
BUN: 14 mg/dL (ref 8–27)
CO2: 17 mmol/L — ABNORMAL LOW (ref 20–29)
Calcium: 9.5 mg/dL (ref 8.7–10.3)
Chloride: 103 mmol/L (ref 96–106)
Creatinine, Ser: 0.64 mg/dL (ref 0.57–1.00)
GFR calc Af Amer: 101 mL/min/{1.73_m2} (ref >59)
GFR calc non Af Amer: 88 mL/min/{1.73_m2} (ref >59)
Glucose: 83 mg/dL (ref 65–99)
Potassium: 3.9 mmol/L (ref 3.5–5.2)
Sodium: 139 mmol/L (ref 134–144)

## 2019-11-18 MED ORDER — IOHEXOL 350 MG/ML SOLN: 80.0000 mL | Freq: Once | INTRAVENOUS | Status: DC | PRN

## 2019-11-18 MED ORDER — NITROGLYCERIN 0.4 MG SL SUBL
0.8000 mg | SUBLINGUAL_TABLET | Freq: Once | SUBLINGUAL | Status: AC
  Administered 2019-11-18: 0.8 mg via SUBLINGUAL

## 2019-11-18 MED ORDER — LIDOCAINE HCL 1 % IJ SOLN
INTRAMUSCULAR | Status: AC
  Filled 2019-11-18: qty 20

## 2019-11-18 MED ORDER — LIDOCAINE HCL 1 % IJ SOLN
INTRAMUSCULAR | Status: AC | PRN
  Administered 2019-11-18: 3 mL

## 2019-11-18 MED ORDER — IOHEXOL 350 MG/ML SOLN
160.0000 mL | Freq: Once | INTRAVENOUS | Status: AC | PRN
  Administered 2019-11-18: 160 mL via INTRAVENOUS

## 2019-11-18 MED ORDER — NITROGLYCERIN 0.4 MG SL SUBL
SUBLINGUAL_TABLET | SUBLINGUAL | Status: AC
  Filled 2019-11-18: qty 1

## 2019-11-18 NOTE — Research (Signed)
CADFEM Informed Consent                  Subject Name:   Bethany Ray   Subject met inclusion and exclusion criteria.  The informed consent form, study requirements and expectations were reviewed with the subject and questions and concerns were addressed prior to the signing of the consent form.  The subject verbalized understanding of the trial requirements.  The subject agreed to participate in the CADFEM trial and signed the informed consent.  The informed consent was obtained prior to performance of any protocol-specific procedures for the subject.  A copy of the signed informed consent was given to the subject and a copy was placed in the subject's medical record.   [place] Chalmers, Research Assistant  11/18/2019 06:44 a.m.

## 2019-11-18 NOTE — Procedures (Signed)
PROCEDURE SUMMARY:  Successful placement micro-puncture IV to the left basilic vein. No complications Ready for use. EBL = trace  Please see full dictation in Imaging section for details.   Charmagne Buhl S Lindley Hiney PA-C 11/18/2019 8:42 AM

## 2019-11-25 DIAGNOSIS — H401111 Primary open-angle glaucoma, right eye, mild stage: Secondary | ICD-10-CM | POA: Diagnosis not present

## 2019-12-15 DIAGNOSIS — M170 Bilateral primary osteoarthritis of knee: Secondary | ICD-10-CM | POA: Diagnosis not present

## 2019-12-15 DIAGNOSIS — Z13820 Encounter for screening for osteoporosis: Secondary | ICD-10-CM | POA: Diagnosis not present

## 2019-12-15 DIAGNOSIS — M79643 Pain in unspecified hand: Secondary | ICD-10-CM | POA: Diagnosis not present

## 2019-12-15 DIAGNOSIS — M25561 Pain in right knee: Secondary | ICD-10-CM | POA: Diagnosis not present

## 2019-12-15 DIAGNOSIS — E1169 Type 2 diabetes mellitus with other specified complication: Secondary | ICD-10-CM | POA: Diagnosis not present

## 2019-12-15 DIAGNOSIS — E785 Hyperlipidemia, unspecified: Secondary | ICD-10-CM | POA: Diagnosis not present

## 2019-12-15 DIAGNOSIS — M25569 Pain in unspecified knee: Secondary | ICD-10-CM | POA: Diagnosis not present

## 2019-12-15 DIAGNOSIS — M0579 Rheumatoid arthritis with rheumatoid factor of multiple sites without organ or systems involvement: Secondary | ICD-10-CM | POA: Diagnosis not present

## 2019-12-15 DIAGNOSIS — Z79899 Other long term (current) drug therapy: Secondary | ICD-10-CM | POA: Diagnosis not present

## 2020-01-04 DIAGNOSIS — E1129 Type 2 diabetes mellitus with other diabetic kidney complication: Secondary | ICD-10-CM | POA: Diagnosis not present

## 2020-01-06 DIAGNOSIS — R7309 Other abnormal glucose: Secondary | ICD-10-CM | POA: Diagnosis not present

## 2020-01-06 DIAGNOSIS — I10 Essential (primary) hypertension: Secondary | ICD-10-CM | POA: Diagnosis not present

## 2020-01-06 DIAGNOSIS — M069 Rheumatoid arthritis, unspecified: Secondary | ICD-10-CM | POA: Diagnosis not present

## 2020-01-06 DIAGNOSIS — E785 Hyperlipidemia, unspecified: Secondary | ICD-10-CM | POA: Diagnosis not present

## 2020-01-06 DIAGNOSIS — E1129 Type 2 diabetes mellitus with other diabetic kidney complication: Secondary | ICD-10-CM | POA: Diagnosis not present

## 2020-02-03 ENCOUNTER — Encounter: Payer: Self-pay | Admitting: Cardiovascular Disease

## 2020-02-03 ENCOUNTER — Telehealth (INDEPENDENT_AMBULATORY_CARE_PROVIDER_SITE_OTHER): Payer: Medicare HMO | Admitting: Cardiovascular Disease

## 2020-02-03 VITALS — Ht 60.0 in | Wt 180.0 lb

## 2020-02-03 DIAGNOSIS — R0789 Other chest pain: Secondary | ICD-10-CM

## 2020-02-03 DIAGNOSIS — R011 Cardiac murmur, unspecified: Secondary | ICD-10-CM | POA: Diagnosis not present

## 2020-02-03 DIAGNOSIS — E785 Hyperlipidemia, unspecified: Secondary | ICD-10-CM | POA: Diagnosis not present

## 2020-02-03 DIAGNOSIS — I10 Essential (primary) hypertension: Secondary | ICD-10-CM

## 2020-02-03 NOTE — Patient Instructions (Signed)
Medication Instructions:  Your physician recommends that you continue on your current medications as directed. Please refer to the Current Medication list given to you today.  *If you need a refill on your cardiac medications before your next appointment, please call your pharmacy*   Lab Work: NONE   If you have labs (blood work) drawn today and your tests are completely normal, you will receive your results only by: Marland Kitchen MyChart Message (if you have MyChart) OR . A paper copy in the mail If you have any lab test that is abnormal or we need to change your treatment, we will call you to review the results.   Testing/Procedures: NONE    Follow-Up: At [facility], you and your health needs are our priority.  As part of our continuing mission to provide you with exceptional heart care, we have created designated Provider Care Teams.  These Care Teams include your primary Cardiologist (physician) and Advanced Practice Providers (APPs -  Physician Assistants and Nurse Practitioners) who all work together to provide you with the care you need, when you need it.  We recommend signing up for the patient portal called "MyChart".  Sign up information is provided on this After Visit Summary.  MyChart is used to connect with patients for Virtual Visits (Telemedicine).  Patients are able to view lab/test results, encounter notes, upcoming appointments, etc.  Non-urgent messages can be sent to your provider as well.   To learn more about what you can do with MyChart, go to [email or web address].    Your next appointment:    As Needed   The format for your next appointment:   In Person  Provider:   Kate Sable, MD   Other Instructions Thank you for choosing [facility]!

## 2020-02-03 NOTE — Progress Notes (Signed)
Virtual Visit via Telephone Note   This visit type was conducted due to national recommendations for restrictions regarding the COVID-19 Pandemic (e.g. social distancing) in an effort to limit this patient's exposure and mitigate transmission in our community.  Due to her co-morbid illnesses, this patient is at least at moderate risk for complications without adequate follow up.  This format is felt to be most appropriate for this patient at this time.  The patient did not have access to video technology/had technical difficulties with video requiring transitioning to audio format only (telephone).  All issues noted in this document were discussed and addressed.  No physical exam could be performed with this format.  Please refer to the patient's chart for her  consent to telehealth for [facility].   The patient was identified using 2 identifiers.  Date:  02/03/2020   ID:  JACQUES NICOLAY, DOB Jul 16, 1946, MRN [id number]  Patient Location: Home Provider Location: Home  PCP:  Asencion Noble, MD  Cardiologist:  No primary care provider on file.  Electrophysiologist:  None   Evaluation Performed:  Follow-Up Visit  Chief Complaint:  Chest pain  History of Present Illness:    Bethany Ray is a 74 y.o. female with history of chest pain.  She currently denies chest pain and palpitations. She seldom has shortness of breath.   Past Medical History:  Diagnosis Date  . Arthritis   . Breast cancer ([place]) 09/14/2013  . Cancer ([place]) approx 2006   rt breast/lumpectomy/chemo/rad tx  . Diabetes mellitus   . High cholesterol   . Hypertension   . Shingles    Past Surgical History:  Procedure Laterality Date  . ABDOMINAL HYSTERECTOMY    . BREAST SURGERY    . CHOLECYSTECTOMY    . IR RADIOLOGY PERIPHERAL GUIDED IV START  11/18/2019  . IR US GUIDE VASC ACCESS RIGHT  11/18/2019     Current Meds  Medication Sig  . acetaminophen (TYLENOL) 500 MG tablet Take 500 mg by mouth every 6 (six) hours  as needed. Pain  . amLODipine (NORVASC) 5 MG tablet Take 5 mg by mouth daily.  Marland Kitchen aspirin EC 81 MG tablet Take 81 mg by mouth daily.  . B Complex-C (B-COMPLEX WITH VITAMIN C) tablet Take 1 tablet by mouth daily.  . Calcium Carbonate (CALTRATE 600 PO) Take 1 tablet by mouth daily.   . folic acid (FOLVITE) 1 MG tablet Take 1 mg by mouth daily.  Marland Kitchen glimepiride (AMARYL) 1 MG tablet Take 1 mg by mouth daily with breakfast.   . losartan-hydrochlorothiazide (HYZAAR) 100-25 MG per tablet Take 1 tablet by mouth daily.  . metFORMIN (GLUCOPHAGE-XR) 500 MG 24 hr tablet Take 500 mg by mouth 2 (two) times daily.   . methotrexate (RHEUMATREX) 2.5 MG tablet Take 10 mg by mouth 2 (two) times a week. Takes on Thursdays and Fridays  . Multiple Vitamin (MULITIVITAMIN WITH MINERALS) TABS Take 1 tablet by mouth daily.  . pravastatin (PRAVACHOL) 20 MG tablet Take 20 mg by mouth every evening.      Allergies:   Penicillins   Social History   Tobacco Use  . Smoking status: Never Smoker  . Smokeless tobacco: Never Used  Substance Use Topics  . Alcohol use: No  . Drug use: No     Family Hx: The patient's family history is not on file.  ROS:   Please see the history of present illness.     All other systems reviewed and are negative.  Prior CV studies:   The following studies were reviewed today:  Echocardiogram 07/22/2019:  1. Left ventricular ejection fraction, by visual estimation, is 70 to 75%. The left ventricle has hyperdynamic function. Normal left ventricular size. There is mildly increased left ventricular hypertrophy. 2. Global right ventricle has normal systolic function.The right ventricular size is normal. No increase in right ventricular wall thickness. 3. Left atrial size was normal. 4. Right atrial size was normal. 5. Mild aortic valve annular calcification. 6. Mild mitral annular calcification. 7. The mitral valve is grossly normal. Trace mitral valve regurgitation. 8. The  tricuspid valve is grossly normal. Tricuspid valve regurgitation is trivial. 9. The aortic valve is tricuspid Aortic valve regurgitation was not visualized by color flow Doppler. 10. The pulmonic valve was grossly normal. Pulmonic valve regurgitation is trivial by color flow Doppler. 11. Mildly elevated pulmonary artery systolic pressure. 12. The tricuspid regurgitant velocity is 2.93 m/s, and with an assumed right atrial pressure of 3 mmHg, the estimated right ventricular systolic pressure is mildly elevated at 37.3 mmHg. 13. The inferior vena cava is normal in size with greater than 50% respiratory variability, suggesting right atrial pressure of 3 mmHg.   Coronary CT angiography 11/18/2019:  Pulmonary veins drain normally to the left atrium. No LA appendage thrombus noted.  Calcium Score: 0 Agatston units.  Coronary Arteries: Right dominant with no anomalies  LM: No plaque or stenosis.  LAD system:  No plaque or stenosis.  Circumflex system: No plaque or stenosis.  RCA system: No plaque or stenosis.  IMPRESSION: 1. Coronary artery calcium score 0 Agatston units, suggesting low risk for future cardiac events.  2.  No significant coronary disease noted.  Labs/Other Tests and Data Reviewed:    EKG:  No ECG reviewed.  Recent Labs: 11/18/2019: BUN 17; Creatinine, Ser 0.70; Hemoglobin 12.6; Potassium 3.6; Sodium 140   Recent Lipid Panel No results found for: CHOL, TRIG, HDL, CHOLHDL, LDLCALC, LDLDIRECT  Wt Readings from Last 3 Encounters:  02/03/20 180 lb (81.6 kg)  11/03/19 188 lb (85.3 kg)  07/13/19 185 lb (83.9 kg)     Objective:    Vital Signs:  Ht 5' (1.524 m)   Wt 180 lb (81.6 kg)   BMI 35.15 kg/m    VITAL SIGNS:  reviewed  ASSESSMENT & PLAN:    1.  Chest pain: Coronary CT angiography is normal. Symptoms are noncardiac.  2.  Hypertension: No changes to therapy.  3.  Hyperlipidemia: Continue statin therapy.  4.  Cardiac murmur:  Echocardiogram on 07/22/2019 demonstrated vigorous LV systolic function, EF [id number].  This is likely the reason for the murmur.  There was no significant valvular regurgitation.    COVID-19 Education: The signs and symptoms of COVID-19 were discussed with the patient and how to seek care for testing (follow up with PCP or arrange E-visit).  The importance of social distancing was discussed today.  Time:   Today, I have spent 15 minutes with the patient with telehealth technology discussing the above problems.     Medication Adjustments/Labs and Tests Ordered: Current medicines are reviewed at length with the patient today.  Concerns regarding medicines are outlined above.   Tests Ordered: No orders of the defined types were placed in this encounter.   Medication Changes: No orders of the defined types were placed in this encounter.   Follow Up:  Virtual Visit  prn  Signed, Kate Sable, MD  02/03/2020 10:07 AM    [place]

## 2020-03-18 DIAGNOSIS — M79643 Pain in unspecified hand: Secondary | ICD-10-CM | POA: Diagnosis not present

## 2020-03-18 DIAGNOSIS — M170 Bilateral primary osteoarthritis of knee: Secondary | ICD-10-CM | POA: Diagnosis not present

## 2020-03-18 DIAGNOSIS — Z79899 Other long term (current) drug therapy: Secondary | ICD-10-CM | POA: Diagnosis not present

## 2020-03-18 DIAGNOSIS — E1169 Type 2 diabetes mellitus with other specified complication: Secondary | ICD-10-CM | POA: Diagnosis not present

## 2020-03-18 DIAGNOSIS — M0579 Rheumatoid arthritis with rheumatoid factor of multiple sites without organ or systems involvement: Secondary | ICD-10-CM | POA: Diagnosis not present

## 2020-03-18 DIAGNOSIS — E785 Hyperlipidemia, unspecified: Secondary | ICD-10-CM | POA: Diagnosis not present

## 2020-03-18 DIAGNOSIS — Z13820 Encounter for screening for osteoporosis: Secondary | ICD-10-CM | POA: Diagnosis not present

## 2020-03-18 DIAGNOSIS — M25569 Pain in unspecified knee: Secondary | ICD-10-CM | POA: Diagnosis not present

## 2020-04-29 DIAGNOSIS — E1129 Type 2 diabetes mellitus with other diabetic kidney complication: Secondary | ICD-10-CM | POA: Diagnosis not present

## 2020-04-30 ENCOUNTER — Emergency Department (HOSPITAL_COMMUNITY)
Admission: EM | Admit: 2020-04-30 | Discharge: 2020-04-30 | Discharge status: Against Medical Advice | Payer: Medicare HMO

## 2020-04-30 ENCOUNTER — Other Ambulatory Visit: Payer: Self-pay

## 2020-04-30 DIAGNOSIS — E119 Type 2 diabetes mellitus without complications: Secondary | ICD-10-CM | POA: Diagnosis not present

## 2020-04-30 DIAGNOSIS — I10 Essential (primary) hypertension: Secondary | ICD-10-CM | POA: Diagnosis not present

## 2020-04-30 DIAGNOSIS — M25572 Pain in left ankle and joints of left foot: Secondary | ICD-10-CM | POA: Insufficient documentation

## 2020-04-30 DIAGNOSIS — Z853 Personal history of malignant neoplasm of breast: Secondary | ICD-10-CM | POA: Diagnosis not present

## 2020-04-30 DIAGNOSIS — M79672 Pain in left foot: Secondary | ICD-10-CM | POA: Diagnosis not present

## 2020-04-30 DIAGNOSIS — Z85038 Personal history of other malignant neoplasm of large intestine: Secondary | ICD-10-CM | POA: Diagnosis not present

## 2020-04-30 DIAGNOSIS — R600 Localized edema: Secondary | ICD-10-CM | POA: Diagnosis not present

## 2020-04-30 DIAGNOSIS — M25462 Effusion, left knee: Secondary | ICD-10-CM | POA: Diagnosis not present

## 2020-05-01 ENCOUNTER — Emergency Department (HOSPITAL_COMMUNITY): Payer: Medicare HMO

## 2020-05-01 ENCOUNTER — Emergency Department (HOSPITAL_COMMUNITY)
Admission: EM | Admit: 2020-05-01 | Discharge: 2020-05-01 | Disposition: A | Discharge status: Home / Self Care | Payer: Medicare HMO | Attending: Emergency Medicine | Admitting: Emergency Medicine

## 2020-05-01 ENCOUNTER — Encounter (HOSPITAL_COMMUNITY): Payer: Self-pay | Admitting: *Deleted

## 2020-05-01 DIAGNOSIS — M79672 Pain in left foot: Secondary | ICD-10-CM

## 2020-05-01 DIAGNOSIS — M25462 Effusion, left knee: Secondary | ICD-10-CM | POA: Diagnosis not present

## 2020-05-01 DIAGNOSIS — R600 Localized edema: Secondary | ICD-10-CM | POA: Diagnosis not present

## 2020-05-01 LAB — BASIC METABOLIC PANEL
Anion gap: 11 (ref 5–15)
BUN: 19 mg/dL (ref 8–23)
CO2: 19 mmol/L — ABNORMAL LOW (ref 22–32)
Calcium: 9.5 mg/dL (ref 8.9–10.3)
Chloride: 107 mmol/L (ref 98–111)
Creatinine, Ser: 0.89 mg/dL (ref 0.44–1.00)
GFR calc Af Amer: >60 mL/min (ref >60)
GFR calc non Af Amer: >60 mL/min (ref >60)
Glucose, Bld: 261 mg/dL — ABNORMAL HIGH (ref 70–99)
Potassium: 4 mmol/L (ref 3.5–5.1)
Sodium: 137 mmol/L (ref 135–145)

## 2020-05-01 LAB — CBC WITH DIFFERENTIAL/PLATELET
Abs Immature Granulocytes: 0.07 10*3/uL (ref 0.00–0.07)
Basophils Absolute: 0 10*3/uL (ref 0.0–0.1)
Basophils Relative: 0 %
Eosinophils Absolute: 0 10*3/uL (ref 0.0–0.5)
Eosinophils Relative: 0 %
HCT: 37 % (ref 36.0–46.0)
Hemoglobin: 12.2 g/dL (ref 12.0–15.0)
Immature Granulocytes: 0 %
Lymphocytes Relative: 10 %
Lymphs Abs: 1.7 10*3/uL (ref 0.7–4.0)
MCH: 29.5 pg (ref 26.0–34.0)
MCHC: 33 g/dL (ref 30.0–36.0)
MCV: 89.4 fL (ref 80.0–100.0)
Monocytes Absolute: 1.1 10*3/uL — ABNORMAL HIGH (ref 0.1–1.0)
Monocytes Relative: 7 %
Neutro Abs: 14.2 10*3/uL — ABNORMAL HIGH (ref 1.7–7.7)
Neutrophils Relative %: 83 %
Platelets: 349 10*3/uL (ref 150–400)
RBC: 4.14 MIL/uL (ref 3.87–5.11)
RDW: 15.6 % — ABNORMAL HIGH (ref 11.5–15.5)
WBC: 17.2 10*3/uL — ABNORMAL HIGH (ref 4.0–10.5)
nRBC: 0 % (ref 0.0–0.2)

## 2020-05-01 LAB — URIC ACID: Uric Acid, Serum: 5.6 mg/dL (ref 2.5–7.1)

## 2020-05-01 MED ORDER — HYDROMORPHONE HCL 1 MG/ML IJ SOLN
0.5000 mg | Freq: Once | INTRAMUSCULAR | Status: AC
  Administered 2020-05-01: 0.5 mg via INTRAVENOUS
  Filled 2020-05-01: qty 1

## 2020-05-01 MED ORDER — IOHEXOL 300 MG/ML  SOLN
75.0000 mL | Freq: Once | INTRAMUSCULAR | Status: AC | PRN
  Administered 2020-05-01: 75 mL via INTRAVENOUS

## 2020-05-01 MED ORDER — OXYCODONE-ACETAMINOPHEN 5-325 MG PO TABS: 1.0000 | ORAL_TABLET | Freq: Four times a day (QID) | ORAL | 0 refills | Status: DC | PRN

## 2020-05-01 MED ORDER — HYDROCODONE-ACETAMINOPHEN 5-325 MG PO TABS: 1.0000 | ORAL_TABLET | Freq: Four times a day (QID) | ORAL | 0 refills | Status: DC | PRN

## 2020-05-01 MED ORDER — OXYCODONE-ACETAMINOPHEN 5-325 MG PO TABS
1.0000 | ORAL_TABLET | Freq: Once | ORAL | Status: AC
  Administered 2020-05-01: 1 via ORAL
  Filled 2020-05-01: qty 1

## 2020-05-01 MED ORDER — FENTANYL CITRATE (PF) 100 MCG/2ML IJ SOLN
50.0000 ug | Freq: Once | INTRAMUSCULAR | Status: AC
  Administered 2020-05-01: 50 ug via INTRAVENOUS
  Filled 2020-05-01: qty 2

## 2020-05-01 NOTE — Discharge Instructions (Addendum)
Keep your leg elevated. Follow-up with Dr. Willey Blade as planned. Take the pain medicine as needed

## 2020-05-01 NOTE — ED Provider Notes (Signed)
Emergency Department Provider Note   I have reviewed the triage vital signs and the nursing notes.   HISTORY  Chief Complaint Foot Pain   HPI Bethany Ray is a 74 y.o. female with past history reviewed below presents emergency department with pain in the left foot with associated swelling which began today.  Patient states that she was up on her feet more than normal cleaning today and that she had some mild soreness earlier in the day but nothing severe.  She went to a party for her pastor and was seated at this party when she went to stand up and noticed severe pain in the left foot.  She had difficulty standing and noticed some associated swelling.  Pain is in the foot and ankle on the left.  She denies similar pain in the past.  No known injury.  She denies any fevers or shaking chills.  Pain is constant and worse with touching the area. No CP or SOB.    Past Medical History:  Diagnosis Date  . Arthritis   . Breast cancer ([place]) 09/14/2013  . Cancer ([place]) approx 2006   rt breast/lumpectomy/chemo/rad tx  . Diabetes mellitus   . High cholesterol   . Hypertension   . Shingles     Patient Active Problem List   Diagnosis Date Noted  . Colon cancer screening 05/25/2019  . Chest pain 05/25/2019  . Unilateral primary osteoarthritis, right knee 02/27/2017  . Unilateral primary osteoarthritis, left knee 02/27/2017  . Breast cancer of lower-inner quadrant of right female breast ([place]) 09/14/2013  . STRESS FRACTURE-SITE 11/19/2007  . ANKLE SPRAIN 11/19/2007    Past Surgical History:  Procedure Laterality Date  . ABDOMINAL HYSTERECTOMY    . BREAST SURGERY    . CHOLECYSTECTOMY    . IR RADIOLOGY PERIPHERAL GUIDED IV START  11/18/2019  . IR US GUIDE VASC ACCESS RIGHT  11/18/2019    Allergies Penicillins  No family history on file.  Social History Social History   Tobacco Use  . Smoking status: Never Smoker  . Smokeless tobacco: Never Used  Vaping Use  . Vaping Use:  Never used  Substance Use Topics  . Alcohol use: No  . Drug use: No    Review of Systems  Constitutional: No fever/chills Eyes: No visual changes. ENT: No sore throat. Cardiovascular: Denies chest pain. Respiratory: Denies shortness of breath. Gastrointestinal: No abdominal pain.  No nausea, no vomiting.  No diarrhea.  No constipation. Genitourinary: Negative for dysuria. Musculoskeletal: Negative for back pain. Positive right foot pain.  Skin: Negative for rash. Neurological: Negative for headaches, focal weakness or numbness.  10-point ROS otherwise negative.  ____________________________________________   PHYSICAL EXAM:  VITAL SIGNS: ED Triage Vitals  Enc Vitals Group     BP 05/01/20 0038 (!) 179/73     Pulse Rate 05/01/20 0038 91     Resp 05/01/20 0038 20     Temp 05/01/20 0038 98 F (36.7 C)     Temp Source 05/01/20 0038 Oral     SpO2 05/01/20 0038 100 %     Weight 05/01/20 0039 180 lb (81.6 kg)     Height 05/01/20 0039 5' (1.524 m)   Constitutional: Alert and oriented. Well appearing and in no acute distress. Eyes: Conjunctivae are normal.  Head: Atraumatic. Nose: No congestion/rhinnorhea. Mouth/Throat: Mucous membranes are moist.   Neck: No stridor. Cardiovascular: Normal rate, regular rhythm. Good peripheral circulation with 2+ DP and PT pulses in the left foot. Grossly  normal heart sounds.   Respiratory: Normal respiratory effort.  No retractions. Lungs CTAB. Gastrointestinal: Soft and nontender. No distention.  Musculoskeletal: Patient with bilateral edema slight worse on the left 1+. No joint erythema or warmth.  Tenderness diffusely over the ankle and foot somewhat worse laterally. No proximal fibular tenderness.  Neurologic:  Normal speech and language. No gross focal neurologic deficits are appreciated.  Skin:  Skin is warm, dry and intact. No rash noted.    ____________________________________________   LABS (all labs ordered are listed, but only  abnormal results are displayed)  Labs Reviewed  BASIC METABOLIC PANEL - Abnormal; Notable for the following components:      Result Value   CO2 19 (*)    Glucose, Bld 261 (*)    All other components within normal limits  CBC WITH DIFFERENTIAL/PLATELET - Abnormal; Notable for the following components:   WBC 17.2 (*)    RDW 15.6 (*)    Neutro Abs 14.2 (*)    Monocytes Absolute 1.1 (*)    All other components within normal limits  URIC ACID   ____________________________________________  RADIOLOGY  DG Ankle Complete Left  Result Date: 05/01/2020 CLINICAL DATA:  Foot and leg pain EXAM: LEFT FOOT - COMPLETE 3+ VIEW; LEFT ANKLE COMPLETE - 3+ VIEW COMPARISON:  11/15/2007 FINDINGS: Left ankle: Frontal, oblique, and lateral views demonstrate no acute displaced fracture. The ankle mortise is intact. There is diffuse lower extremity soft tissue edema. Stable calcaneal spurs. Left foot: Frontal, oblique, and lateral views of the left foot demonstrate no acute displaced fracture. Mild osteoarthritis of the midfoot and first metatarsophalangeal joint. There is diffuse soft tissue edema, greatest in the dorsum of the forefoot. IMPRESSION: 1. No acute fracture of the left foot or left ankle. 2. Multifocal osteoarthritis of the left foot. 3. Diffuse soft tissue edema throughout the visualized left ankle and left foot. Electronically Signed   By: Randa Ngo M.D.   On: 05/01/2020 02:05   CT TIBIA FIBULA LEFT W CONTRAST  Result Date: 05/01/2020 CLINICAL DATA:  Worsening pain and swelling in the left lower leg, extending into the foot. Limited weight-bearing EXAM: CT OF THE LOWER LEFT EXTREMITY WITH CONTRAST TECHNIQUE: Multidetector CT imaging of the left lower leg, left ankle and left foot was performed according to the standard protocol following intravenous contrast administration. COMPARISON:  Radiographs same date. CONTRAST:  10mL OMNIPAQUE IOHEXOL 300 MG/ML  SOLN FINDINGS: Bones/Joint/Cartilage The  images extend from the distal femur through the foot. There is mild motion artifact on the images through the mid lower leg. There is no evidence of acute fracture or dislocation. Advanced tricompartmental degenerative changes are present at the left knee with joint space narrowing, osteophytes and multiple intra-articular loose bodies. There is a small to moderate knee joint effusion. No significant arthropathy or effusion at the ankle or hindfoot. Mild midfoot and 1st metatarsal-phalangeal degenerative changes. Ligaments Suboptimally assessed by CT. The anterior cruciate ligament is not well visualized. Muscles and Tendons No abnormal muscular enhancement or fluid collection identified. There is no focal muscular atrophy. The extensor mechanism is intact at the knee. The ankle tendons appear intact. Soft tissues Minimal subcutaneous edema at the ankle with extension into the foot. No focal fluid collection, foreign body or soft tissue emphysema. No significant vascular findings are seen. IMPRESSION: 1. No acute findings or explanation for the patient's symptoms. 2. Advanced tricompartmental degenerative changes at the left knee with multiple intra-articular loose bodies. Small to moderate knee joint effusion. 3.  Minimal nonspecific subcutaneous edema at the ankle with extension into the foot. No focal fluid collection, foreign body or soft tissue emphysema. Electronically Signed   By: Richardean Sale M.D.   On: 05/01/2020 11:47   CT ANKLE LEFT W CONTRAST  Result Date: 05/01/2020 CLINICAL DATA:  Worsening pain and swelling in the left lower leg, extending into the foot. Limited weight-bearing EXAM: CT OF THE LOWER LEFT EXTREMITY WITH CONTRAST TECHNIQUE: Multidetector CT imaging of the left lower leg, left ankle and left foot was performed according to the standard protocol following intravenous contrast administration. COMPARISON:  Radiographs same date. CONTRAST:  65mL OMNIPAQUE IOHEXOL 300 MG/ML  SOLN  FINDINGS: Bones/Joint/Cartilage The images extend from the distal femur through the foot. There is mild motion artifact on the images through the mid lower leg. There is no evidence of acute fracture or dislocation. Advanced tricompartmental degenerative changes are present at the left knee with joint space narrowing, osteophytes and multiple intra-articular loose bodies. There is a small to moderate knee joint effusion. No significant arthropathy or effusion at the ankle or hindfoot. Mild midfoot and 1st metatarsal-phalangeal degenerative changes. Ligaments Suboptimally assessed by CT. The anterior cruciate ligament is not well visualized. Muscles and Tendons No abnormal muscular enhancement or fluid collection identified. There is no focal muscular atrophy. The extensor mechanism is intact at the knee. The ankle tendons appear intact. Soft tissues Minimal subcutaneous edema at the ankle with extension into the foot. No focal fluid collection, foreign body or soft tissue emphysema. No significant vascular findings are seen. IMPRESSION: 1. No acute findings or explanation for the patient's symptoms. 2. Advanced tricompartmental degenerative changes at the left knee with multiple intra-articular loose bodies. Small to moderate knee joint effusion. 3. Minimal nonspecific subcutaneous edema at the ankle with extension into the foot. No focal fluid collection, foreign body or soft tissue emphysema. Electronically Signed   By: Richardean Sale M.D.   On: 05/01/2020 11:47   CT FOOT LEFT W CONTRAST  Result Date: 05/01/2020 CLINICAL DATA:  Worsening pain and swelling in the left lower leg, extending into the foot. Limited weight-bearing EXAM: CT OF THE LOWER LEFT EXTREMITY WITH CONTRAST TECHNIQUE: Multidetector CT imaging of the left lower leg, left ankle and left foot was performed according to the standard protocol following intravenous contrast administration. COMPARISON:  Radiographs same date. CONTRAST:  40mL  OMNIPAQUE IOHEXOL 300 MG/ML  SOLN FINDINGS: Bones/Joint/Cartilage The images extend from the distal femur through the foot. There is mild motion artifact on the images through the mid lower leg. There is no evidence of acute fracture or dislocation. Advanced tricompartmental degenerative changes are present at the left knee with joint space narrowing, osteophytes and multiple intra-articular loose bodies. There is a small to moderate knee joint effusion. No significant arthropathy or effusion at the ankle or hindfoot. Mild midfoot and 1st metatarsal-phalangeal degenerative changes. Ligaments Suboptimally assessed by CT. The anterior cruciate ligament is not well visualized. Muscles and Tendons No abnormal muscular enhancement or fluid collection identified. There is no focal muscular atrophy. The extensor mechanism is intact at the knee. The ankle tendons appear intact. Soft tissues Minimal subcutaneous edema at the ankle with extension into the foot. No focal fluid collection, foreign body or soft tissue emphysema. No significant vascular findings are seen. IMPRESSION: 1. No acute findings or explanation for the patient's symptoms. 2. Advanced tricompartmental degenerative changes at the left knee with multiple intra-articular loose bodies. Small to moderate knee joint effusion. 3. Minimal nonspecific subcutaneous edema  at the ankle with extension into the foot. No focal fluid collection, foreign body or soft tissue emphysema. Electronically Signed   By: Richardean Sale M.D.   On: 05/01/2020 11:47   US Venous Img Lower  Left (DVT Study)  Result Date: 05/01/2020 CLINICAL DATA:  Edema, pain EXAM: LEFT LOWER EXTREMITY VENOUS DOPPLER ULTRASOUND TECHNIQUE: Gray-scale sonography with compression, as well as color and duplex ultrasound, were performed to evaluate the deep venous system(s) from the level of the common femoral vein through the popliteal and proximal calf veins. COMPARISON:  None. FINDINGS: VENOUS Normal  compressibility of the common femoral, superficial femoral, and popliteal veins, as well as the visualized calf veins. Visualized portions of profunda femoral vein and great saphenous vein unremarkable. No filling defects to suggest DVT on grayscale or color Doppler imaging. Doppler waveforms show normal direction of venous flow, normal respiratory phasicity and response to augmentation. Limited views of the contralateral common femoral vein are unremarkable. OTHER None. Limitations: none IMPRESSION: No femoropopliteal DVT nor evidence of DVT within the visualized calf veins. If clinical symptoms are inconsistent or if there are persistent or worsening symptoms, further imaging (possibly involving the iliac veins) may be warranted. Electronically Signed   By: Lucrezia Europe M.D.   On: 05/01/2020 09:20   DG Foot Complete Left  Result Date: 05/01/2020 CLINICAL DATA:  Foot and leg pain EXAM: LEFT FOOT - COMPLETE 3+ VIEW; LEFT ANKLE COMPLETE - 3+ VIEW COMPARISON:  11/15/2007 FINDINGS: Left ankle: Frontal, oblique, and lateral views demonstrate no acute displaced fracture. The ankle mortise is intact. There is diffuse lower extremity soft tissue edema. Stable calcaneal spurs. Left foot: Frontal, oblique, and lateral views of the left foot demonstrate no acute displaced fracture. Mild osteoarthritis of the midfoot and first metatarsophalangeal joint. There is diffuse soft tissue edema, greatest in the dorsum of the forefoot. IMPRESSION: 1. No acute fracture of the left foot or left ankle. 2. Multifocal osteoarthritis of the left foot. 3. Diffuse soft tissue edema throughout the visualized left ankle and left foot. Electronically Signed   By: Randa Ngo M.D.   On: 05/01/2020 02:05    ____________________________________________   PROCEDURES  Procedure(s) performed:   Procedures  None ____________________________________________   INITIAL IMPRESSION / ASSESSMENT AND PLAN / ED COURSE  Pertinent labs &  imaging results that were available during my care of the patient were reviewed by me and considered in my medical decision making (see chart for details).   Patient presents to the emergency department with acute onset left leg pain and swelling which began tonight without injury.  She has no outward findings to suspect septic joint. Normal pulses on exam. She does have osteoarthritis on her plain film of the foot and ankle but no acute fracture.  No large joint effusion noted. Pain seems MSK in etiology but DVT would be a consideration as well. No [place] available at this time.   Labs with nonspecific leukocytosis. Patient's pain improved with IV pain medications. Will get DVT US and CT to evaluate for occult fracture or possible underlying infectious process. Care transferred to Dr. Roderic Palau.  ____________________________________________  FINAL CLINICAL IMPRESSION(S) / ED DIAGNOSES  Final diagnoses:  Foot pain, left     MEDICATIONS GIVEN DURING THIS VISIT:  Medications  oxyCODONE-acetaminophen (PERCOCET/ROXICET) 5-325 MG per tablet 1 tablet (1 tablet Oral Given 05/01/20 0200)  fentaNYL (SUBLIMAZE) injection 50 mcg (50 mcg Intravenous Given 05/01/20 0317)  HYDROmorphone (DILAUDID) injection 0.5 mg (0.5 mg Intravenous Given 05/01/20 1004)  iohexol (OMNIPAQUE) 300 MG/ML solution 75 mL (75 mLs Intravenous Contrast Given 05/01/20 1114)     NEW OUTPATIENT MEDICATIONS STARTED DURING THIS VISIT:  Discharge Medication List as of 05/01/2020 12:20 PM    START taking these medications   Details  oxyCODONE-acetaminophen (PERCOCET/ROXICET) 5-325 MG tablet Take 1 tablet by mouth every 6 (six) hours as needed., Starting Sun 05/01/2020, Normal        Note:  This document was prepared using Dragon voice recognition software and may include unintentional dictation errors.  Nanda Quinton, MD, [facility]    Emmaline Wahba, [facility], MD 05/01/20 [phone]

## 2020-05-01 NOTE — ED Triage Notes (Signed)
Pt c/o left foo tand leg pain and swelling that started tonight, denies any injury,

## 2020-05-06 DIAGNOSIS — M79672 Pain in left foot: Secondary | ICD-10-CM | POA: Diagnosis not present

## 2020-05-06 DIAGNOSIS — E1129 Type 2 diabetes mellitus with other diabetic kidney complication: Secondary | ICD-10-CM | POA: Diagnosis not present

## 2020-05-06 DIAGNOSIS — I10 Essential (primary) hypertension: Secondary | ICD-10-CM | POA: Diagnosis not present

## 2020-05-18 DIAGNOSIS — Z01419 Encounter for gynecological examination (general) (routine) without abnormal findings: Secondary | ICD-10-CM | POA: Diagnosis not present

## 2020-05-18 DIAGNOSIS — M6208 Separation of muscle (nontraumatic), other site: Secondary | ICD-10-CM | POA: Diagnosis not present

## 2020-05-26 DIAGNOSIS — H401111 Primary open-angle glaucoma, right eye, mild stage: Secondary | ICD-10-CM | POA: Diagnosis not present

## 2020-06-14 DIAGNOSIS — Z1231 Encounter for screening mammogram for malignant neoplasm of breast: Secondary | ICD-10-CM | POA: Diagnosis not present

## 2020-06-29 DIAGNOSIS — E785 Hyperlipidemia, unspecified: Secondary | ICD-10-CM | POA: Diagnosis not present

## 2020-06-29 DIAGNOSIS — Z13820 Encounter for screening for osteoporosis: Secondary | ICD-10-CM | POA: Diagnosis not present

## 2020-06-29 DIAGNOSIS — M170 Bilateral primary osteoarthritis of knee: Secondary | ICD-10-CM | POA: Diagnosis not present

## 2020-06-29 DIAGNOSIS — M0579 Rheumatoid arthritis with rheumatoid factor of multiple sites without organ or systems involvement: Secondary | ICD-10-CM | POA: Diagnosis not present

## 2020-06-29 DIAGNOSIS — M79643 Pain in unspecified hand: Secondary | ICD-10-CM | POA: Diagnosis not present

## 2020-06-29 DIAGNOSIS — E1169 Type 2 diabetes mellitus with other specified complication: Secondary | ICD-10-CM | POA: Diagnosis not present

## 2020-06-29 DIAGNOSIS — Z79899 Other long term (current) drug therapy: Secondary | ICD-10-CM | POA: Diagnosis not present

## 2020-06-29 DIAGNOSIS — Z23 Encounter for immunization: Secondary | ICD-10-CM | POA: Diagnosis not present

## 2020-06-29 DIAGNOSIS — M25569 Pain in unspecified knee: Secondary | ICD-10-CM | POA: Diagnosis not present

## 2020-07-08 ENCOUNTER — Ambulatory Visit (INDEPENDENT_AMBULATORY_CARE_PROVIDER_SITE_OTHER): Payer: Medicare HMO

## 2020-07-08 ENCOUNTER — Ambulatory Visit
Admission: EM | Admit: 2020-07-08 | Discharge: 2020-07-08 | Disposition: A | Discharge status: Home / Self Care | Payer: Medicare HMO | Attending: Emergency Medicine | Admitting: Emergency Medicine

## 2020-07-08 ENCOUNTER — Other Ambulatory Visit: Payer: Self-pay

## 2020-07-08 DIAGNOSIS — S3992XA Unspecified injury of lower back, initial encounter: Secondary | ICD-10-CM | POA: Diagnosis not present

## 2020-07-08 DIAGNOSIS — M533 Sacrococcygeal disorders, not elsewhere classified: Secondary | ICD-10-CM | POA: Diagnosis not present

## 2020-07-08 MED ORDER — NAPROXEN 375 MG PO TABS: 375.0000 mg | ORAL_TABLET | Freq: Two times a day (BID) | ORAL | 0 refills | Status: DC

## 2020-07-08 MED ORDER — HYDROCODONE-ACETAMINOPHEN 5-325 MG PO TABS: 2.0000 | ORAL_TABLET | Freq: Two times a day (BID) | ORAL | 0 refills | Status: DC | PRN

## 2020-07-08 NOTE — ED Provider Notes (Addendum)
[place]   [id number] 07/08/20 Arrival Time: 1027  CC: Tailbone PAIN  SUBJECTIVE: History from: patient. Bethany Ray is a 74 y.o. female complains of tailbone pain x 5 days.  Fell backwards while walking down concrete steps  Localizes the pain to the tailbone.  Describes the pain as intermittent and achy in character.  Has tried OTC medications without relief.  Symptoms are made worse with sitting and standing from a seated position.  Denies similar symptoms in the past.  Denies fever, chills, erythema, ecchymosis, effusion, weakness, numbness and tingling, saddle paresthesias, loss of bowel or bladder function.      ROS: As per HPI.  All other pertinent ROS negative.     Past Medical History:  Diagnosis Date   Arthritis    Breast cancer ([place]) 09/14/2013   Cancer ([place]) approx 2006   rt breast/lumpectomy/chemo/rad tx   Diabetes mellitus    High cholesterol    Hypertension    Shingles    Past Surgical History:  Procedure Laterality Date   ABDOMINAL HYSTERECTOMY     BREAST SURGERY     CHOLECYSTECTOMY     IR RADIOLOGY PERIPHERAL GUIDED IV START  11/18/2019   IR US GUIDE VASC ACCESS RIGHT  11/18/2019   Allergies  Allergen Reactions   Penicillins Other (See Comments)    Has patient had a PCN reaction causing immediate rash, facial/tongue/throat swelling, SOB or lightheadedness with hypotension: No Has patient had a PCN reaction causing severe rash involving mucus membranes or skin necrosis: No Has patient had a PCN reaction that required hospitalization No Has patient had a PCN reaction occurring within the last 10 years: Non  If all of the above answers are "NO", then may proceed with Cephalosporin use.   Unknown   No current facility-administered medications on file prior to encounter.   Current Outpatient Medications on File Prior to Encounter  Medication Sig Dispense Refill   acetaminophen (TYLENOL) 500 MG tablet Take 500 mg by mouth every 6  (six) hours as needed. Pain     amLODipine (NORVASC) 5 MG tablet Take 5 mg by mouth daily.     aspirin EC 81 MG tablet Take 81 mg by mouth daily.     B Complex-C (B-COMPLEX WITH VITAMIN C) tablet Take 1 tablet by mouth daily.     Calcium Carbonate (CALTRATE 600 PO) Take 1 tablet by mouth daily.      folic acid (FOLVITE) 1 MG tablet Take 1 mg by mouth daily.     glimepiride (AMARYL) 1 MG tablet Take 1 mg by mouth daily with breakfast.      losartan-hydrochlorothiazide (HYZAAR) 100-25 MG per tablet Take 1 tablet by mouth daily.     metFORMIN (GLUCOPHAGE-XR) 500 MG 24 hr tablet Take 500 mg by mouth 2 (two) times daily.      methotrexate (RHEUMATREX) 2.5 MG tablet Take 10 mg by mouth 2 (two) times a week. Takes on Thursdays and Fridays     Multiple Vitamin (MULITIVITAMIN WITH MINERALS) TABS Take 1 tablet by mouth daily.     pravastatin (PRAVACHOL) 20 MG tablet Take 20 mg by mouth every evening.      Social History   Socioeconomic History   Marital status: Married    Spouse name: Not on file   Number of children: Not on file   Years of education: Not on file   Highest education level: Not on file  Occupational History   Not on file  Tobacco Use  Smoking status: Never Smoker   Smokeless tobacco: Never Used  Vaping Use   Vaping Use: Never used  Substance and Sexual Activity   Alcohol use: No   Drug use: No   Sexual activity: Not Currently  Other Topics Concern   Not on file  Social History Narrative   Not on file   Social Determinants of Health   Financial Resource Strain:    Difficulty of Paying Living Expenses: Not on file  Food Insecurity:    Worried About [place] in the Last Year: Not on file   Ran Out of Food in the Last Year: Not on file  Transportation Needs:    Lack of Transportation (Medical): Not on file   Lack of Transportation (Non-Medical): Not on file  Physical Activity:    Days of Exercise per Week: Not on file    Minutes of Exercise per Session: Not on file  Stress:    Feeling of Stress : Not on file  Social Connections:    Frequency of Communication with Friends and Family: Not on file   Frequency of Social Gatherings with Friends and Family: Not on file   Attends Religious Services: Not on file   Active Member of Clubs or Organizations: Not on file   Attends Archivist Meetings: Not on file   Marital Status: Not on file  Intimate Partner Violence:    Fear of Current or Ex-Partner: Not on file   Emotionally Abused: Not on file   Physically Abused: Not on file   Sexually Abused: Not on file   No family history on file.  OBJECTIVE:  Vitals:   07/08/20 1001  BP: (!) 158/72  Pulse: 62  Resp: 20  Temp: 98.1 F (36.7 C)  SpO2: 98%    General appearance: ALERT; in no acute distress.  Head: NCAT Lungs: Normal respiratory effort Musculoskeletal: Back  Inspection: Skin warm, dry, clear and intact without obvious erythema, effusion, or ecchymosis.  Palpation: TTP over tailbone Strength: 5/5 knee extension, 5/5 knee flexion, 5/5 knee extension Skin: warm and dry Neurologic: Ambulates without difficulty; Sensation intact about the upper/ lower extremities Psychological: alert and cooperative; tearful mood and affect  DIAGNOSTIC STUDIES:  DG Sacrum/Coccyx  Addendum Date: 07/08/2020   ADDENDUM REPORT: 07/08/2020 12:13 ADDENDUM: Correction: Lateral view demonstrates NO displaced fracture of the sacrum or coccyx. Electronically Signed   By: Suzy Bouchard M.D.   On: 07/08/2020 12:13   Result Date: 07/08/2020 CLINICAL DATA:  Fall.  Tailbone injury. EXAM: SACRUM AND COCCYX - 2+ VIEW COMPARISON:  Lumbar spine radiograph 09/02/2016 FINDINGS: No sacral fracture on AP view. SI joints intact. Pubic symphysis normal. Degenerative changes hip joints with osteophytosis. Lateral view demonstrates displaced fracture of the sacrum or coccyx. IMPRESSION: No acute findings of the pelvis  or sacrum or coccyx. Electronically Signed: By: Suzy Bouchard M.D. On: 07/08/2020 10:36    X-rays NEGATIVE FOR FRACTURE  I have reviewed the x-rays myself and the radiologist interpretation. I am in agreement with the radiologist interpretation.     ASSESSMENT & PLAN:  1. Injury of coccyx, initial encounter     Meds ordered this encounter  Medications   naproxen (NAPROSYN) 375 MG tablet    Sig: Take 1 tablet (375 mg total) by mouth 2 (two) times daily.    Dispense:  20 tablet    Refill:  0    Order Specific Question:   Supervising Provider    Answer:   Blanchie Serve  SUE [[id number]]   HYDROcodone-acetaminophen (NORCO/VICODIN) 5-325 MG tablet    Sig: Take 2 tablets by mouth every 12 (twelve) hours as needed for severe pain.    Dispense:  6 tablet    Refill:  0    Order Specific Question:   Supervising Provider    Answer:   Raylene Everts [[id number]]   X-ray negative for displaced fracture of the sacrum or coccyx Continue conservative management of rest, and ice Use a pillow or seat cushion while sitting Sleep on side or abdomen in the meantime Take naproxen as needed for pain relief (may cause abdominal discomfort, ulcers, and GI bleeds avoid taking with other NSAIDs) Norco prescribed for severe break-through pain.  DO NOT DRIVE OR OPERATE heavy machinery while taking this medication Follow up with PCP or orthopedist for further evaluation and management Return or go to the ER if you have any new or worsening symptoms (fever, chills, chest pain, abdominal pain, changes in bowel or bladder habits, pain radiating into lower legs, etc...)   Reviewed expectations re: course of current medical issues. Questions answered. Outlined signs and symptoms indicating need for more acute intervention. Patient verbalized understanding. After Visit Summary given.    Lestine Box, PA-C 07/08/20 [address], [place], PA-C 07/08/20 [place], [place], PA-C 07/08/20  1411

## 2020-07-08 NOTE — ED Triage Notes (Signed)
Pt fell backwards on steps on Monday and injured tailbone

## 2020-07-08 NOTE — Discharge Instructions (Addendum)
X-ray negative for displaced fracture of the sacrum or coccyx Continue conservative management of rest, and ice Use a pillow or seat cushion while sitting Sleep on side or abdomen in the meantime Take naproxen as needed for pain relief (may cause abdominal discomfort, ulcers, and GI bleeds avoid taking with other NSAIDs) Norco prescribed for severe break-through pain.  DO NOT DRIVE OR OPERATE heavy machinery while taking this medication Follow up with PCP or orthopedist for further evaluation and management Return or go to the ER if you have any new or worsening symptoms (fever, chills, chest pain, abdominal pain, changes in bowel or bladder habits, pain radiating into lower legs, etc...)

## 2020-08-30 ENCOUNTER — Emergency Department (HOSPITAL_COMMUNITY)
Admission: EM | Admit: 2020-08-30 | Discharge: 2020-08-30 | Disposition: A | Discharge status: Home / Self Care | Payer: Medicare HMO | Attending: Emergency Medicine | Admitting: Emergency Medicine

## 2020-08-30 ENCOUNTER — Emergency Department (HOSPITAL_COMMUNITY): Payer: Medicare HMO

## 2020-08-30 ENCOUNTER — Encounter (HOSPITAL_COMMUNITY): Payer: Self-pay | Admitting: *Deleted

## 2020-08-30 ENCOUNTER — Other Ambulatory Visit: Payer: Self-pay

## 2020-08-30 DIAGNOSIS — E785 Hyperlipidemia, unspecified: Secondary | ICD-10-CM | POA: Diagnosis not present

## 2020-08-30 DIAGNOSIS — E119 Type 2 diabetes mellitus without complications: Secondary | ICD-10-CM | POA: Insufficient documentation

## 2020-08-30 DIAGNOSIS — Z7982 Long term (current) use of aspirin: Secondary | ICD-10-CM | POA: Insufficient documentation

## 2020-08-30 DIAGNOSIS — E1129 Type 2 diabetes mellitus with other diabetic kidney complication: Secondary | ICD-10-CM | POA: Diagnosis not present

## 2020-08-30 DIAGNOSIS — I10 Essential (primary) hypertension: Secondary | ICD-10-CM | POA: Diagnosis not present

## 2020-08-30 DIAGNOSIS — Z79899 Other long term (current) drug therapy: Secondary | ICD-10-CM | POA: Diagnosis not present

## 2020-08-30 DIAGNOSIS — S46012A Strain of muscle(s) and tendon(s) of the rotator cuff of left shoulder, initial encounter: Secondary | ICD-10-CM | POA: Diagnosis not present

## 2020-08-30 DIAGNOSIS — C50919 Malignant neoplasm of unspecified site of unspecified female breast: Secondary | ICD-10-CM | POA: Diagnosis not present

## 2020-08-30 DIAGNOSIS — X500XXA Overexertion from strenuous movement or load, initial encounter: Secondary | ICD-10-CM | POA: Insufficient documentation

## 2020-08-30 DIAGNOSIS — R079 Chest pain, unspecified: Secondary | ICD-10-CM | POA: Diagnosis not present

## 2020-08-30 DIAGNOSIS — Z853 Personal history of malignant neoplasm of breast: Secondary | ICD-10-CM | POA: Diagnosis not present

## 2020-08-30 DIAGNOSIS — R0789 Other chest pain: Secondary | ICD-10-CM | POA: Insufficient documentation

## 2020-08-30 DIAGNOSIS — Z7984 Long term (current) use of oral hypoglycemic drugs: Secondary | ICD-10-CM | POA: Insufficient documentation

## 2020-08-30 DIAGNOSIS — S46912A Strain of unspecified muscle, fascia and tendon at shoulder and upper arm level, left arm, initial encounter: Secondary | ICD-10-CM | POA: Insufficient documentation

## 2020-08-30 DIAGNOSIS — S4990XA Unspecified injury of shoulder and upper arm, unspecified arm, initial encounter: Secondary | ICD-10-CM | POA: Diagnosis present

## 2020-08-30 LAB — CBC
HCT: 38.7 % (ref 36.0–46.0)
Hemoglobin: 12.5 g/dL (ref 12.0–15.0)
MCH: 29.6 pg (ref 26.0–34.0)
MCHC: 32.3 g/dL (ref 30.0–36.0)
MCV: 91.5 fL (ref 80.0–100.0)
Platelets: 300 10*3/uL (ref 150–400)
RBC: 4.23 MIL/uL (ref 3.87–5.11)
RDW: 15.2 % (ref 11.5–15.5)
WBC: 11.3 10*3/uL — ABNORMAL HIGH (ref 4.0–10.5)
nRBC: 0 % (ref 0.0–0.2)

## 2020-08-30 LAB — BASIC METABOLIC PANEL
Anion gap: 10 (ref 5–15)
BUN: 12 mg/dL (ref 8–23)
CO2: 22 mmol/L (ref 22–32)
Calcium: 9.8 mg/dL (ref 8.9–10.3)
Chloride: 104 mmol/L (ref 98–111)
Creatinine, Ser: 0.79 mg/dL (ref 0.44–1.00)
GFR, Estimated: >60 mL/min (ref >60)
Glucose, Bld: 86 mg/dL (ref 70–99)
Potassium: 3.5 mmol/L (ref 3.5–5.1)
Sodium: 136 mmol/L (ref 135–145)

## 2020-08-30 LAB — TROPONIN I (HIGH SENSITIVITY): Troponin I (High Sensitivity): 5 ng/L (ref <18)

## 2020-08-30 MED ORDER — DICLOFENAC SODIUM 1 % EX GEL: 4.0000 g | Freq: Four times a day (QID) | CUTANEOUS | 0 refills | Status: DC

## 2020-08-30 MED ORDER — ACETAMINOPHEN 500 MG PO TABS
1000.0000 mg | ORAL_TABLET | Freq: Once | ORAL | Status: AC
  Administered 2020-08-30: 1000 mg via ORAL
  Filled 2020-08-30: qty 2

## 2020-08-30 NOTE — ED Triage Notes (Signed)
Pt left chest pain that radiates down left arm that started the other night.  Pt denies sob or N/V.  + diarrhea that goes and comes per pt.

## 2020-08-30 NOTE — Discharge Instructions (Signed)
Take tylenol 1000mg (2 extra strength) four times a day.   Use the sling for comfort. You do need to take your arm out of the sling at least 4 times a day and perform range of motion exercises. Your pain is most likely muscular. Your work-up here was not consistent with a heart attack. The reason why you are on blood pressure medications and medicine for your cholesterol is to help reduce your risk of heart attack or stroke. Please discuss with your family doctor. Return for worsening pain or if you realize that occurs on exertion.

## 2020-08-30 NOTE — ED Provider Notes (Signed)
[facility] EMERGENCY DEPARTMENT Provider Note   CSN: [id number] Arrival date & time: 08/30/20  1436     History Chief Complaint  Patient presents with  . Chest Pain    Bethany Ray is a 74 y.o. female.  74 yo F with a chief complaints of left-sided chest pain and left shoulder pain.  Actually started with shoulder pain a few days ago.  Worse with certain positions and movement.  States it is especially worse when she tries to lift something heavy with that side.  Started having a dull pain in her chest a couple days before presentation here.  Not exertional.  No shortness of breath.  Denies history of MI.  Has a history of hypertension hyperlipidemia diabetes.  Denies diabetes or family history of MI.  Denies history of PE or DVT.  The history is provided by the patient.  Chest Pain Pain location:  L chest Pain quality: sharp and shooting   Pain radiates to:  Does not radiate Pain severity:  Moderate Onset quality:  Gradual Duration:  2 days Timing:  Constant Progression:  Worsening Chronicity:  New Relieved by:  Nothing Worsened by:  Movement (heavy lifting) Ineffective treatments:  None tried Associated symptoms: no dizziness, no fever, no headache, no nausea, no palpitations, no shortness of breath and no vomiting        Past Medical History:  Diagnosis Date  . Arthritis   . Breast cancer ([place]) 09/14/2013  . Cancer ([place]) approx 2006   rt breast/lumpectomy/chemo/rad tx  . Diabetes mellitus   . High cholesterol   . Hypertension   . Shingles     Patient Active Problem List   Diagnosis Date Noted  . Colon cancer screening 05/25/2019  . Chest pain 05/25/2019  . Unilateral primary osteoarthritis, right knee 02/27/2017  . Unilateral primary osteoarthritis, left knee 02/27/2017  . Breast cancer of lower-inner quadrant of right female breast ([place]) 09/14/2013  . STRESS FRACTURE-SITE 11/19/2007  . ANKLE SPRAIN 11/19/2007    Past Surgical History:  Procedure  Laterality Date  . ABDOMINAL HYSTERECTOMY    . BREAST SURGERY    . CHOLECYSTECTOMY    . IR RADIOLOGY PERIPHERAL GUIDED IV START  11/18/2019  . IR US GUIDE VASC ACCESS RIGHT  11/18/2019     OB History    Gravida      Para      Term      Preterm      AB      Living  2     SAB      TAB      Ectopic      Multiple      Live Births              History reviewed. No pertinent family history.  Social History   Tobacco Use  . Smoking status: Never Smoker  . Smokeless tobacco: Never Used  Vaping Use  . Vaping Use: Never used  Substance Use Topics  . Alcohol use: No  . Drug use: No    Home Medications Prior to Admission medications   Medication Sig Start Date End Date Taking? Authorizing Provider  acetaminophen (TYLENOL) 500 MG tablet Take 500 mg by mouth every 6 (six) hours as needed. Pain    [provider]  amLODipine (NORVASC) 5 MG tablet Take 5 mg by mouth daily.    [provider]  aspirin EC 81 MG tablet Take 81 mg by mouth daily.  [provider]  B Complex-C (B-COMPLEX WITH VITAMIN C) tablet Take 1 tablet by mouth daily.    [provider]  Calcium Carbonate (CALTRATE 600 PO) Take 1 tablet by mouth daily.     [provider]  diclofenac Sodium (VOLTAREN) 1 % GEL Apply 4 g topically 4 (four) times daily. 08/30/20   Deno Etienne, DO  folic acid (FOLVITE) 1 MG tablet Take 1 mg by mouth daily.    [provider]  glimepiride (AMARYL) 1 MG tablet Take 1 mg by mouth daily with breakfast.  06/16/16   [provider]  HYDROcodone-acetaminophen (NORCO/VICODIN) 5-325 MG tablet Take 2 tablets by mouth every 12 (twelve) hours as needed for severe pain. 07/08/20   Wurst, [place], PA-C  losartan-hydrochlorothiazide (HYZAAR) 100-25 MG per tablet Take 1 tablet by mouth daily.    [provider]  metFORMIN (GLUCOPHAGE-XR) 500 MG 24 hr tablet Take 500 mg by mouth 2 (two) times daily.  08/29/16   [provider]  methotrexate (RHEUMATREX) 2.5 MG tablet Take 10 mg by mouth 2 (two) times a week. Takes on Thursdays and Fridays 08/15/16   [provider]  Multiple Vitamin (MULITIVITAMIN WITH MINERALS) TABS Take 1 tablet by mouth daily.    [provider]  naproxen (NAPROSYN) 375 MG tablet Take 1 tablet (375 mg total) by mouth 2 (two) times daily. 07/08/20   Wurst, [place], PA-C  pravastatin (PRAVACHOL) 20 MG tablet Take 20 mg by mouth every evening.     [provider]    Allergies    Penicillins  Review of Systems   Review of Systems  Constitutional: Negative for chills and fever.  HENT: Negative for congestion and rhinorrhea.   Eyes: Negative for redness and visual disturbance.  Respiratory: Negative for shortness of breath and wheezing.   Cardiovascular: Positive for chest pain. Negative for palpitations.  Gastrointestinal: Negative for nausea and vomiting.  Genitourinary: Negative for dysuria and urgency.  Musculoskeletal: Positive for arthralgias. Negative for myalgias.  Skin: Negative for pallor and wound.  Neurological: Negative for dizziness and headaches.    Physical Exam Updated Vital Signs BP (!) 148/56   Pulse (!) 58   Temp 98.8 F (37.1 C) (Oral)   Resp (!) 26   Ht 5' (1.524 m)   Wt 81.6 kg   SpO2 100%   BMI 35.15 kg/m   Physical Exam Vitals and nursing note reviewed.  Constitutional:      General: She is not in acute distress.    Appearance: She is well-developed. She is not diaphoretic.  HENT:     Head: Normocephalic and atraumatic.  Eyes:     Pupils: Pupils are equal, round, and reactive to light.  Cardiovascular:     Rate and Rhythm: Normal rate and regular rhythm.     Heart sounds: No murmur heard.  No friction rub. No gallop.   Pulmonary:     Effort: Pulmonary effort is normal.     Breath sounds: No wheezing or rales.  Chest:     Chest wall: Tenderness present.     Comments: Tenderness with palpation of the left  chest wall worst about the lateral clavicular line about ribs 3 through 5.  Pain with range of motion of the left shoulder.  Mild trapezius tenderness.  Sits appears to be intact.  Pulses and sensation intact to the left upper extremity. Abdominal:     General: There is no distension.     Palpations: Abdomen is soft.  Tenderness: There is no abdominal tenderness.  Musculoskeletal:        General: No tenderness.     Cervical back: Normal range of motion and neck supple.  Skin:    General: Skin is warm and dry.  Neurological:     Mental Status: She is alert and oriented to person, place, and time.  Psychiatric:        Behavior: Behavior normal.     ED Results / Procedures / Treatments   Labs (all labs ordered are listed, but only abnormal results are displayed) Labs Reviewed  CBC - Abnormal; Notable for the following components:      Result Value   WBC 11.3 (*)    All other components within normal limits  BASIC METABOLIC PANEL  TROPONIN I (HIGH SENSITIVITY)    EKG EKG Interpretation  Date/Time:  Tuesday August 30 2020 14:43:25 EST Ventricular Rate:  67 PR Interval:  142 QRS Duration: 78 QT Interval:  368 QTC Calculation: 388 R Axis:   -9 Text Interpretation: Normal sinus rhythm Cannot rule out Anterior infarct , age undetermined Abnormal ECG since last tracing no significant change Confirmed by Noemi Chapel [phone]) on 08/30/2020 2:56:27 PM   Radiology DG Chest Portable 1 View  Result Date: 08/30/2020 CLINICAL DATA:  Chest pain EXAM: PORTABLE CHEST 1 VIEW COMPARISON:  2019 FINDINGS: No consolidation or edema. No pleural effusion or pneumothorax. Stable cardiomediastinal contours. IMPRESSION: No acute process in the chest. Electronically Signed   By: Macy Mis M.D.   On: 08/30/2020 15:45    Procedures Procedures (including critical care time)  Medications Ordered in ED Medications  acetaminophen (TYLENOL) tablet 1,000 mg (has no administration in time  range)    ED Course  I have reviewed the triage vital signs and the nursing notes.  Pertinent labs & imaging results that were available during my care of the patient were reviewed by me and considered in my medical decision making (see chart for details).    MDM Rules/Calculators/A&P                          74 yo F with a chief complaints of left-sided chest pain.  By history it sounds like a muscular strain.  Pain is reproduced on exam.  Not exertional no shortness of breath.  She has had pain greater than 6 hours without significant change.  Will obtain a single troponin chest x-ray.  EKG without concerning finding.  Chest x-ray without focal infiltrate or pneumothorax.  Troponin is negative. Discharge home. PCP follow-up.  4:38 PM:  I have discussed the diagnosis/risks/treatment options with the patient and believe the pt to be eligible for discharge home to follow-up with PCP. We also discussed returning to the ED immediately if new or worsening sx occur. We discussed the sx which are most concerning (e.g., sudden worsening pain, fever, inability to tolerate by mouth) that necessitate immediate return. Medications administered to the patient during their visit and any new prescriptions provided to the patient are listed below.  Medications given during this visit Medications  acetaminophen (TYLENOL) tablet 1,000 mg (has no administration in time range)     The patient appears reasonably screen and/or stabilized for discharge and I doubt any other medical condition or other [facility] requiring further screening, evaluation, or treatment in the ED at this time prior to discharge.   Final Clinical Impression(s) / ED Diagnoses Final diagnoses:  Left shoulder strain, initial encounter  Rx / DC Orders ED Discharge Orders         Ordered    diclofenac Sodium (VOLTAREN) 1 % GEL  4 times daily        08/30/20 1636           Deno Etienne, DO 08/30/20 1638

## 2020-09-08 DIAGNOSIS — E1122 Type 2 diabetes mellitus with diabetic chronic kidney disease: Secondary | ICD-10-CM | POA: Diagnosis not present

## 2020-09-08 DIAGNOSIS — R7309 Other abnormal glucose: Secondary | ICD-10-CM | POA: Diagnosis not present

## 2020-09-08 DIAGNOSIS — I10 Essential (primary) hypertension: Secondary | ICD-10-CM | POA: Diagnosis not present

## 2020-09-08 DIAGNOSIS — E785 Hyperlipidemia, unspecified: Secondary | ICD-10-CM | POA: Diagnosis not present

## 2020-10-05 DIAGNOSIS — M25569 Pain in unspecified knee: Secondary | ICD-10-CM | POA: Diagnosis not present

## 2020-10-05 DIAGNOSIS — Z79899 Other long term (current) drug therapy: Secondary | ICD-10-CM | POA: Diagnosis not present

## 2020-10-05 DIAGNOSIS — E785 Hyperlipidemia, unspecified: Secondary | ICD-10-CM | POA: Diagnosis not present

## 2020-10-05 DIAGNOSIS — M25561 Pain in right knee: Secondary | ICD-10-CM | POA: Diagnosis not present

## 2020-10-05 DIAGNOSIS — Z13820 Encounter for screening for osteoporosis: Secondary | ICD-10-CM | POA: Diagnosis not present

## 2020-10-05 DIAGNOSIS — M79643 Pain in unspecified hand: Secondary | ICD-10-CM | POA: Diagnosis not present

## 2020-10-05 DIAGNOSIS — M0579 Rheumatoid arthritis with rheumatoid factor of multiple sites without organ or systems involvement: Secondary | ICD-10-CM | POA: Diagnosis not present

## 2020-10-05 DIAGNOSIS — Z780 Asymptomatic menopausal state: Secondary | ICD-10-CM | POA: Diagnosis not present

## 2020-10-05 DIAGNOSIS — M170 Bilateral primary osteoarthritis of knee: Secondary | ICD-10-CM | POA: Diagnosis not present

## 2020-10-05 DIAGNOSIS — E1169 Type 2 diabetes mellitus with other specified complication: Secondary | ICD-10-CM | POA: Diagnosis not present

## 2020-10-18 DIAGNOSIS — B0052 Herpesviral keratitis: Secondary | ICD-10-CM | POA: Diagnosis not present

## 2020-10-20 DIAGNOSIS — B0052 Herpesviral keratitis: Secondary | ICD-10-CM | POA: Diagnosis not present

## 2020-10-21 DIAGNOSIS — B0052 Herpesviral keratitis: Secondary | ICD-10-CM | POA: Diagnosis not present

## 2020-10-27 DIAGNOSIS — B0052 Herpesviral keratitis: Secondary | ICD-10-CM | POA: Diagnosis not present

## 2020-12-01 DIAGNOSIS — B0052 Herpesviral keratitis: Secondary | ICD-10-CM | POA: Diagnosis not present

## 2020-12-01 DIAGNOSIS — E119 Type 2 diabetes mellitus without complications: Secondary | ICD-10-CM | POA: Diagnosis not present

## 2020-12-01 DIAGNOSIS — H401111 Primary open-angle glaucoma, right eye, mild stage: Secondary | ICD-10-CM | POA: Diagnosis not present

## 2020-12-30 DIAGNOSIS — Z79899 Other long term (current) drug therapy: Secondary | ICD-10-CM | POA: Diagnosis not present

## 2020-12-30 DIAGNOSIS — E785 Hyperlipidemia, unspecified: Secondary | ICD-10-CM | POA: Diagnosis not present

## 2020-12-30 DIAGNOSIS — E1129 Type 2 diabetes mellitus with other diabetic kidney complication: Secondary | ICD-10-CM | POA: Diagnosis not present

## 2020-12-30 DIAGNOSIS — I10 Essential (primary) hypertension: Secondary | ICD-10-CM | POA: Diagnosis not present

## 2021-01-04 DIAGNOSIS — M25562 Pain in left knee: Secondary | ICD-10-CM | POA: Diagnosis not present

## 2021-01-04 DIAGNOSIS — E785 Hyperlipidemia, unspecified: Secondary | ICD-10-CM | POA: Diagnosis not present

## 2021-01-04 DIAGNOSIS — M0579 Rheumatoid arthritis with rheumatoid factor of multiple sites without organ or systems involvement: Secondary | ICD-10-CM | POA: Diagnosis not present

## 2021-01-04 DIAGNOSIS — Z79899 Other long term (current) drug therapy: Secondary | ICD-10-CM | POA: Diagnosis not present

## 2021-01-04 DIAGNOSIS — Z13820 Encounter for screening for osteoporosis: Secondary | ICD-10-CM | POA: Diagnosis not present

## 2021-01-04 DIAGNOSIS — M79643 Pain in unspecified hand: Secondary | ICD-10-CM | POA: Diagnosis not present

## 2021-01-04 DIAGNOSIS — M170 Bilateral primary osteoarthritis of knee: Secondary | ICD-10-CM | POA: Diagnosis not present

## 2021-01-04 DIAGNOSIS — E1169 Type 2 diabetes mellitus with other specified complication: Secondary | ICD-10-CM | POA: Diagnosis not present

## 2021-01-05 DIAGNOSIS — I10 Essential (primary) hypertension: Secondary | ICD-10-CM | POA: Diagnosis not present

## 2021-01-05 DIAGNOSIS — E785 Hyperlipidemia, unspecified: Secondary | ICD-10-CM | POA: Diagnosis not present

## 2021-01-05 DIAGNOSIS — B0052 Herpesviral keratitis: Secondary | ICD-10-CM | POA: Diagnosis not present

## 2021-01-05 DIAGNOSIS — E119 Type 2 diabetes mellitus without complications: Secondary | ICD-10-CM | POA: Diagnosis not present

## 2021-01-06 DIAGNOSIS — E1122 Type 2 diabetes mellitus with diabetic chronic kidney disease: Secondary | ICD-10-CM | POA: Diagnosis not present

## 2021-01-06 DIAGNOSIS — R7309 Other abnormal glucose: Secondary | ICD-10-CM | POA: Diagnosis not present

## 2021-01-06 DIAGNOSIS — I10 Essential (primary) hypertension: Secondary | ICD-10-CM | POA: Diagnosis not present

## 2021-01-09 ENCOUNTER — Encounter (INDEPENDENT_AMBULATORY_CARE_PROVIDER_SITE_OTHER): Payer: Self-pay | Admitting: *Deleted

## 2021-01-13 DIAGNOSIS — B0052 Herpesviral keratitis: Secondary | ICD-10-CM | POA: Diagnosis not present

## 2021-02-04 DIAGNOSIS — I10 Essential (primary) hypertension: Secondary | ICD-10-CM | POA: Diagnosis not present

## 2021-02-04 DIAGNOSIS — E785 Hyperlipidemia, unspecified: Secondary | ICD-10-CM | POA: Diagnosis not present

## 2021-02-04 DIAGNOSIS — E119 Type 2 diabetes mellitus without complications: Secondary | ICD-10-CM | POA: Diagnosis not present

## 2021-02-15 DIAGNOSIS — B0052 Herpesviral keratitis: Secondary | ICD-10-CM | POA: Diagnosis not present

## 2021-03-07 DIAGNOSIS — E785 Hyperlipidemia, unspecified: Secondary | ICD-10-CM | POA: Diagnosis not present

## 2021-03-07 DIAGNOSIS — E119 Type 2 diabetes mellitus without complications: Secondary | ICD-10-CM | POA: Diagnosis not present

## 2021-03-07 DIAGNOSIS — I10 Essential (primary) hypertension: Secondary | ICD-10-CM | POA: Diagnosis not present

## 2021-04-04 DIAGNOSIS — E1129 Type 2 diabetes mellitus with other diabetic kidney complication: Secondary | ICD-10-CM | POA: Diagnosis not present

## 2021-04-06 DIAGNOSIS — E1169 Type 2 diabetes mellitus with other specified complication: Secondary | ICD-10-CM | POA: Diagnosis not present

## 2021-04-06 DIAGNOSIS — M25551 Pain in right hip: Secondary | ICD-10-CM | POA: Diagnosis not present

## 2021-04-06 DIAGNOSIS — M170 Bilateral primary osteoarthritis of knee: Secondary | ICD-10-CM | POA: Diagnosis not present

## 2021-04-06 DIAGNOSIS — M545 Low back pain, unspecified: Secondary | ICD-10-CM | POA: Diagnosis not present

## 2021-04-06 DIAGNOSIS — M25552 Pain in left hip: Secondary | ICD-10-CM | POA: Diagnosis not present

## 2021-04-06 DIAGNOSIS — M25569 Pain in unspecified knee: Secondary | ICD-10-CM | POA: Diagnosis not present

## 2021-04-06 DIAGNOSIS — M549 Dorsalgia, unspecified: Secondary | ICD-10-CM | POA: Diagnosis not present

## 2021-04-06 DIAGNOSIS — E119 Type 2 diabetes mellitus without complications: Secondary | ICD-10-CM | POA: Diagnosis not present

## 2021-04-06 DIAGNOSIS — Z79899 Other long term (current) drug therapy: Secondary | ICD-10-CM | POA: Diagnosis not present

## 2021-04-06 DIAGNOSIS — E785 Hyperlipidemia, unspecified: Secondary | ICD-10-CM | POA: Diagnosis not present

## 2021-04-06 DIAGNOSIS — Z13820 Encounter for screening for osteoporosis: Secondary | ICD-10-CM | POA: Diagnosis not present

## 2021-04-06 DIAGNOSIS — M0579 Rheumatoid arthritis with rheumatoid factor of multiple sites without organ or systems involvement: Secondary | ICD-10-CM | POA: Diagnosis not present

## 2021-04-06 DIAGNOSIS — I10 Essential (primary) hypertension: Secondary | ICD-10-CM | POA: Diagnosis not present

## 2021-04-06 DIAGNOSIS — M79643 Pain in unspecified hand: Secondary | ICD-10-CM | POA: Diagnosis not present

## 2021-04-11 DIAGNOSIS — R7309 Other abnormal glucose: Secondary | ICD-10-CM | POA: Diagnosis not present

## 2021-04-11 DIAGNOSIS — E1122 Type 2 diabetes mellitus with diabetic chronic kidney disease: Secondary | ICD-10-CM | POA: Diagnosis not present

## 2021-04-11 DIAGNOSIS — I10 Essential (primary) hypertension: Secondary | ICD-10-CM | POA: Diagnosis not present

## 2021-04-21 DIAGNOSIS — M545 Low back pain, unspecified: Secondary | ICD-10-CM | POA: Diagnosis not present

## 2021-04-21 DIAGNOSIS — M25551 Pain in right hip: Secondary | ICD-10-CM | POA: Diagnosis not present

## 2021-05-07 DIAGNOSIS — E119 Type 2 diabetes mellitus without complications: Secondary | ICD-10-CM | POA: Diagnosis not present

## 2021-05-07 DIAGNOSIS — E785 Hyperlipidemia, unspecified: Secondary | ICD-10-CM | POA: Diagnosis not present

## 2021-05-07 DIAGNOSIS — I10 Essential (primary) hypertension: Secondary | ICD-10-CM | POA: Diagnosis not present

## 2021-05-25 DIAGNOSIS — B0052 Herpesviral keratitis: Secondary | ICD-10-CM | POA: Diagnosis not present

## 2021-06-01 DIAGNOSIS — I10 Essential (primary) hypertension: Secondary | ICD-10-CM | POA: Diagnosis not present

## 2021-06-01 DIAGNOSIS — M069 Rheumatoid arthritis, unspecified: Secondary | ICD-10-CM | POA: Diagnosis not present

## 2021-06-07 DIAGNOSIS — B0052 Herpesviral keratitis: Secondary | ICD-10-CM | POA: Diagnosis not present

## 2021-06-07 DIAGNOSIS — E785 Hyperlipidemia, unspecified: Secondary | ICD-10-CM | POA: Diagnosis not present

## 2021-06-07 DIAGNOSIS — I10 Essential (primary) hypertension: Secondary | ICD-10-CM | POA: Diagnosis not present

## 2021-06-07 DIAGNOSIS — E119 Type 2 diabetes mellitus without complications: Secondary | ICD-10-CM | POA: Diagnosis not present

## 2021-06-08 DIAGNOSIS — M5136 Other intervertebral disc degeneration, lumbar region: Secondary | ICD-10-CM | POA: Diagnosis not present

## 2021-06-15 DIAGNOSIS — B0052 Herpesviral keratitis: Secondary | ICD-10-CM | POA: Diagnosis not present

## 2021-06-20 DIAGNOSIS — Z1231 Encounter for screening mammogram for malignant neoplasm of breast: Secondary | ICD-10-CM | POA: Diagnosis not present

## 2021-06-27 ENCOUNTER — Encounter (HOSPITAL_COMMUNITY): Payer: Self-pay | Admitting: Physical Therapy

## 2021-06-27 ENCOUNTER — Other Ambulatory Visit: Payer: Self-pay

## 2021-06-27 ENCOUNTER — Ambulatory Visit (HOSPITAL_COMMUNITY): Payer: Medicare HMO | Attending: Physical Medicine and Rehabilitation | Admitting: Physical Therapy

## 2021-06-27 DIAGNOSIS — M25551 Pain in right hip: Secondary | ICD-10-CM | POA: Diagnosis not present

## 2021-06-27 DIAGNOSIS — M545 Low back pain, unspecified: Secondary | ICD-10-CM | POA: Diagnosis not present

## 2021-06-27 NOTE — Patient Instructions (Signed)
Access Code: [email or web address] URL: [email or web address] Date: 06/27/2021 Prepared by: Josue Hector  Exercises Supine Transversus Abdominis Bracing - Hands on Ground - 2-3 x daily - 7 x weekly - 1 sets - 10 reps - 5 second hold Supine Posterior Pelvic Tilt - 2-3 x daily - 7 x weekly - 1 sets - 10 reps - 5 second hold Hooklying Single Knee to Chest Stretch - 2-3 x daily - 7 x weekly - 1 sets - 4 reps - 20 second hold Supine Bridge - 2-3 x daily - 7 x weekly - 1 sets - 10 reps - 5 second hold

## 2021-06-27 NOTE — Therapy (Signed)
[facility] [address] [place], [place], [address] Phone: [phone]   Fax:  [phone]  Physical Therapy Evaluation  Patient Details  Name: Bethany Ray MRN: [id number] Date of Birth: Sep 21, 1946 Referring Provider (PT): Suella Broad MD   Encounter Date: 06/27/2021   PT End of Session - 06/27/21 1121     Visit Number 1    Number of Visits 8    Date for PT Re-Evaluation 07/25/21    Authorization Type [employer]    Authorization Time Period Check auth    PT Start Time 8416    PT Stop Time 1125    PT Time Calculation (min) 45 min    Activity Tolerance Patient tolerated treatment well    Behavior During Therapy [facility] for tasks assessed/performed             Past Medical History:  Diagnosis Date   Arthritis    Breast cancer ([place]) 09/14/2013   Cancer ([place]) approx 2006   rt breast/lumpectomy/chemo/rad tx   Diabetes mellitus    High cholesterol    Hypertension    Shingles     Past Surgical History:  Procedure Laterality Date   ABDOMINAL HYSTERECTOMY     BREAST SURGERY     CHOLECYSTECTOMY     IR RADIOLOGY PERIPHERAL GUIDED IV START  11/18/2019   IR US GUIDE VASC ACCESS RIGHT  11/18/2019    There were no vitals filed for this visit.    Subjective Assessment - 06/27/21 1046     Subjective Patient presents to therapy with complaint of RT hip/ LBP. She Reports history of back pain from fall last year. She states she fractured her tailbone. She states her RT hip started bothering her the beginning of this years. She feels the pain is most centered in hip, but diagnostics suggest it may be coming form her lumbar spine. She states pain "comes and goes". She reports symptoms have improved in the past several weeks. She reports taking Tylenol PRN for pain.    Limitations Sitting;Lifting;Standing;Walking;House hold activities    Patient Stated Goals Reduce pain    Currently in Pain? Yes    Pain Score 7     Pain Location  Back    Pain Orientation Right;Posterior    Pain Descriptors / Indicators Aching;Dull    Pain Type Chronic pain    Pain Onset More than a month ago    Pain Frequency Intermittent    Aggravating Factors  Unsure    Pain Relieving Factors sitting back in recliner, Tylenol    Effect of Pain on Daily Activities Limits                [facility] PT Assessment - 06/27/21 0001       Assessment   Medical Diagnosis Lumbar degeneration    Referring Provider (PT) Suella Broad MD    Prior Therapy Yes      Precautions   Precautions None      Restrictions   Weight Bearing Restrictions No      Balance Screen   Has the patient fallen in the past 6 months No      [place] residence      Prior Function   Level of Independence Independent      Cognition   Overall Cognitive Status Within Functional Limits for tasks assessed      Observation/Other Assessments   Focus on Therapeutic Outcomes (FOTO)  43% function  Posture/Postural Control   Posture/Postural Control Postural limitations    Postural Limitations Anterior pelvic tilt;Increased lumbar lordosis      ROM / Strength   AROM / PROM / Strength AROM;Strength      AROM   AROM Assessment Site Lumbar    Lumbar Flexion WFL   slight pain on return   Lumbar Extension 25% limited    Lumbar - Right Side Bend 25% limited    Lumbar - Left Side Bend 25% limited      Strength   Strength Assessment Site Hip;Knee;Ankle    Right/Left Hip Right;Left    Right Hip Flexion 4/5    Right Hip Extension 4-/5    Right Hip ABduction 4-/5    Left Hip Flexion 4+/5    Left Hip Extension 4/5    Left Hip ABduction 4-/5    Right/Left Knee Right;Left    Right Knee Extension 4+/5    Left Knee Extension 4+/5    Right/Left Ankle Right;Left    Right Ankle Dorsiflexion 5/5    Left Ankle Dorsiflexion 5/5      Palpation   Palpation comment Mod TTP about RT lumbar paraspinals and superior gluteal aspect                         Objective measurements completed on examination: See above findings.       [facility] Adult PT Treatment/Exercise - 06/27/21 0001       Exercises   Exercises Lumbar      Lumbar Exercises: Stretches   Single Knee to Chest Stretch 2 reps;20 seconds      Lumbar Exercises: Supine   Ab Set 5 reps    Pelvic Tilt 5 reps    Bridge 5 reps                     PT Education - 06/27/21 1047     Education Details on evaluation findings, POC and HEP    Person(s) Educated Patient    Methods Explanation;Handout    Comprehension Verbalized understanding              PT Short Term Goals - 06/27/21 1129       PT SHORT TERM GOAL #1   Title Patient will be independent with initial HEP and self-management strategies to improve functional outcomes    Time 2    Period Weeks    Status New    Target Date 07/11/21               PT Long Term Goals - 06/27/21 1140       PT LONG TERM GOAL #1   Title Patient will be independent with advanced HEP and self-management strategies to improve functional outcomes    Time 4    Period Weeks    Status New    Target Date 07/25/21      PT LONG TERM GOAL #2   Title Patient will improve FOTO score to predicted value to indicate improvement in functional outcomes    Time 4    Period Weeks    Status New    Target Date 07/25/21      PT LONG TERM GOAL #3   Title Patient will report at least 70% overall improvement in subjective complaint to indicate improvement in ability to perform ADLs.    Time 4    Period Weeks    Status New    Target Date 07/25/21  PT LONG TERM GOAL #4   Title Patient will have equal to or > 4+/5 MMT throughout BLE to improve ability to perform functional mobility, stair ambulation and ADLs.    Time 4    Period Weeks    Status New    Target Date 07/25/21                    Plan - 06/27/21 1127     Clinical Impression Statement Patient is a 75 y.o. Female who  presents to physical therapy with complaint of low back/ RT hip pain. Patient demonstrates decreased strength, ROM restriction, increased tenderness to palpation and postural abnormalities which are likely contributing to symptoms of pain and are negatively impacting patient ability to perform ADLs and functional mobility tasks. Patient will benefit from skilled physical therapy services to address these deficits to reduce pain, improve level of function with ADLs and functional mobility tasks.    Examination-Activity Limitations Lift;Stand;Transfers;Locomotion Level;Bend;Squat;Stairs    Examination-Participation Restrictions Yard Work;Cleaning;Community Activity;Laundry;Shop    Stability/Clinical Decision Making Stable/Uncomplicated    Clinical Decision Making Low    Rehab Potential Good    PT Frequency 2x / week    PT Duration 4 weeks    PT Treatment/Interventions ADLs/Self Care Home Management;Biofeedback;Cryotherapy;Electrical Stimulation;Contrast Bath;Therapeutic exercise;Patient/family education;Orthotic Fit/Training;Compression bandaging;Neuromuscular re-education;Balance training;DME Instruction;Iontophoresis 4mg /ml Dexamethasone;Gait training;Stair training;Moist Heat;Functional mobility training;Traction;Ultrasound;Parrafin;Fluidtherapy;Therapeutic activities;Manual lymph drainage;Scar mobilization;Passive range of motion;Vestibular;Dry needling;Energy conservation;Splinting;Taping;Vasopneumatic Device;Joint Manipulations;Spinal Manipulations;Visual/perceptual remediation/compensation;Manual techniques    PT Next Visit Plan Review HEP and goals. Progress hip and core strength as tolerated. Improve lumabr and hip mobility with stretching. Manual STM to target are to address pain and restriction.    PT Home Exercise Plan Eval: ab brace, pelvic tilt, bridge, SKTC    Consulted and Agree with Plan of Care Patient             Patient will benefit from skilled therapeutic intervention in order  to improve the following deficits and impairments:  Pain, Improper body mechanics, Increased fascial restricitons, Postural dysfunction, Decreased mobility, Decreased activity tolerance, Decreased range of motion, Decreased strength, Hypomobility, Impaired flexibility  Visit Diagnosis: Low back pain, unspecified back pain laterality, unspecified chronicity, unspecified whether sciatica present  Pain in right hip     Problem List Patient Active Problem List   Diagnosis Date Noted   Colon cancer screening 05/25/2019   Chest pain 05/25/2019   Unilateral primary osteoarthritis, right knee 02/27/2017   Unilateral primary osteoarthritis, left knee 02/27/2017   Breast cancer of lower-inner quadrant of right female breast ([place]) 09/14/2013   STRESS FRACTURE-SITE 11/19/2007   ANKLE SPRAIN 11/19/2007   11:44 AM, 06/27/21 Josue Hector PT DPT  Physical Therapist with [facility]  ([phone] [place], [place], [address] Phone: [phone]   Fax:  [phone]  Name: DAISHIA FETTERLY MRN: [id number] Date of Birth: Mar 19, 1946

## 2021-06-28 DIAGNOSIS — Z79899 Other long term (current) drug therapy: Secondary | ICD-10-CM | POA: Diagnosis not present

## 2021-06-28 DIAGNOSIS — M069 Rheumatoid arthritis, unspecified: Secondary | ICD-10-CM | POA: Diagnosis not present

## 2021-06-28 DIAGNOSIS — E1129 Type 2 diabetes mellitus with other diabetic kidney complication: Secondary | ICD-10-CM | POA: Diagnosis not present

## 2021-06-28 DIAGNOSIS — I10 Essential (primary) hypertension: Secondary | ICD-10-CM | POA: Diagnosis not present

## 2021-06-29 ENCOUNTER — Ambulatory Visit (HOSPITAL_COMMUNITY): Payer: Medicare HMO | Admitting: Physical Therapy

## 2021-06-29 ENCOUNTER — Other Ambulatory Visit: Payer: Self-pay

## 2021-06-29 DIAGNOSIS — M25551 Pain in right hip: Secondary | ICD-10-CM

## 2021-06-29 DIAGNOSIS — M545 Low back pain, unspecified: Secondary | ICD-10-CM

## 2021-06-29 NOTE — Therapy (Signed)
[facility] [address] [place], [place], [address] Phone: [phone]   Fax:  [phone]  Physical Therapy Treatment  Patient Details  Name: Bethany Ray MRN: [id number] Date of Birth: 1946/01/17 Referring Provider (PT): Suella Broad MD   Encounter Date: 06/29/2021   PT End of Session - 06/29/21 1147     Visit Number 2    Number of Visits 8    Date for PT Re-Evaluation 07/25/21    Authorization Type [employer]    Authorization Time Period 8 approved  9/20-10/18    Authorization - Visit Number 2    Authorization - Number of Visits 8    Progress Note Due on Visit 8    PT Start Time 0017    PT Stop Time 1215    PT Time Calculation (min) 40 min    Activity Tolerance Patient tolerated treatment well    Behavior During Therapy [facility] for tasks assessed/performed             Past Medical History:  Diagnosis Date   Arthritis    Breast cancer ([place]) 09/14/2013   Cancer ([place]) approx 2006   rt breast/lumpectomy/chemo/rad tx   Diabetes mellitus    High cholesterol    Hypertension    Shingles     Past Surgical History:  Procedure Laterality Date   ABDOMINAL HYSTERECTOMY     BREAST SURGERY     CHOLECYSTECTOMY     IR RADIOLOGY PERIPHERAL GUIDED IV START  11/18/2019   IR US GUIDE VASC ACCESS RIGHT  11/18/2019    There were no vitals filed for this visit.   Subjective Assessment - 06/29/21 1137     Limitations Sitting;Lifting;Standing;Walking;House hold activities    Patient Stated Goals Reduce pain    Currently in Pain? Yes    Pain Score 6     Pain Location Back    Pain Orientation Right;Posterior    Pain Descriptors / Indicators Aching    Pain Type Chronic pain    Pain Onset More than a month ago    Pain Frequency Intermittent    Aggravating Factors  standing    Pain Relieving Factors sitting in the rcliner    Effect of Pain on Daily Activities limits                               [facility]  Adult PT Treatment/Exercise - 06/29/21 0001       Exercises   Exercises Lumbar      Lumbar Exercises: Stretches   Single Knee to Chest Stretch 3 reps;20 seconds    Lower Trunk Rotation 5 reps    Pelvic Tilt --    Other Lumbar Stretch Exercise hip excursion x 3      Lumbar Exercises: Standing   Heel Raises 10 reps      Lumbar Exercises: Supine   Ab Set 10 reps    Pelvic Tilt 5 reps   pt has difficulty doing correctly   Bent Knee Raise 10 reps    Bridge 10 reps      Lumbar Exercises: Sidelying   Hip Abduction Both;10 reps                     PT Education - 06/29/21 1202     Education Details reviewed HEP added to HEP and reviewed goals    Methods Explanation    Comprehension Verbalized understanding  PT Short Term Goals - 06/29/21 1145       PT SHORT TERM GOAL #1   Title Patient will be independent with initial HEP and self-management strategies to improve functional outcomes    Time 2    Period Weeks    Status On-going    Target Date 07/11/21               PT Long Term Goals - 06/29/21 1145       PT LONG TERM GOAL #1   Title Patient will be independent with advanced HEP and self-management strategies to improve functional outcomes    Time 4    Period Weeks    Status On-going      PT LONG TERM GOAL #2   Title Patient will improve FOTO score to predicted value to indicate improvement in functional outcomes    Time 4    Period Weeks    Status On-going      PT LONG TERM GOAL #3   Title Patient will report at least 70% overall improvement in subjective complaint to indicate improvement in ability to perform ADLs.    Time 4    Period Weeks    Status On-going      PT LONG TERM GOAL #4   Title Patient will have equal to or > 4+/5 MMT throughout BLE to improve ability to perform functional mobility, stair ambulation and ADLs.    Time 4    Period Weeks    Status On-going                   Plan - 06/29/21 1149      Clinical Impression Statement Goals and HEP reviewed with patient.  Began exercises focused on stability and improving ROM>  Pt able to complete all exercises with good technique with verbal cuing.    Examination-Activity Limitations Lift;Stand;Transfers;Locomotion Level;Bend;Squat;Stairs    Examination-Participation Restrictions Yard Work;Cleaning;Community Activity;Laundry;Shop    Stability/Clinical Decision Making Stable/Uncomplicated    Rehab Potential Good    PT Frequency 2x / week    PT Duration 4 weeks    PT Treatment/Interventions ADLs/Self Care Home Management;Biofeedback;Cryotherapy;Electrical Stimulation;Contrast Bath;Therapeutic exercise;Patient/family education;Orthotic Fit/Training;Compression bandaging;Neuromuscular re-education;Balance training;DME Instruction;Iontophoresis 4mg /ml Dexamethasone;Gait training;Stair training;Moist Heat;Functional mobility training;Traction;Ultrasound;Parrafin;Fluidtherapy;Therapeutic activities;Manual lymph drainage;Scar mobilization;Passive range of motion;Vestibular;Dry needling;Energy conservation;Splinting;Taping;Vasopneumatic Device;Joint Manipulations;Spinal Manipulations;Visual/perceptual remediation/compensation;Manual techniques    PT Next Visit Plan . Progress hip and core strength as tolerated. Improve lumabr and hip mobility with stretching. Manual STM to target are to address pain and restriction.    PT Home Exercise Plan Eval: ab brace, pelvic tilt, bridge, SKTC; 9/22: LTR and hip excursion    Consulted and Agree with Plan of Care Patient             Patient will benefit from skilled therapeutic intervention in order to improve the following deficits and impairments:  Pain, Improper body mechanics, Increased fascial restricitons, Postural dysfunction, Decreased mobility, Decreased activity tolerance, Decreased range of motion, Decreased strength, Hypomobility, Impaired flexibility  Visit Diagnosis: Low back pain, unspecified back  pain laterality, unspecified chronicity, unspecified whether sciatica present  Pain in right hip     Problem List Patient Active Problem List   Diagnosis Date Noted   Colon cancer screening 05/25/2019   Chest pain 05/25/2019   Unilateral primary osteoarthritis, right knee 02/27/2017   Unilateral primary osteoarthritis, left knee 02/27/2017   Breast cancer of lower-inner quadrant of right female breast ([place]) 09/14/2013   STRESS FRACTURE-SITE 11/19/2007   ANKLE SPRAIN 11/19/2007  Rayetta Humphrey, PT CLT [phone] PT 06/29/2021, 12:14 PM  [facility] [address] [place], [place], [address] Phone: [phone]   Fax:  [phone]  Name: NAJIA HURLBUTT MRN: [id number] Date of Birth: 04-Mar-1946

## 2021-07-03 DIAGNOSIS — I10 Essential (primary) hypertension: Secondary | ICD-10-CM | POA: Diagnosis not present

## 2021-07-03 DIAGNOSIS — Z23 Encounter for immunization: Secondary | ICD-10-CM | POA: Diagnosis not present

## 2021-07-04 ENCOUNTER — Other Ambulatory Visit: Payer: Self-pay

## 2021-07-04 ENCOUNTER — Encounter (HOSPITAL_COMMUNITY): Payer: Self-pay

## 2021-07-04 ENCOUNTER — Ambulatory Visit (HOSPITAL_COMMUNITY): Payer: Medicare HMO

## 2021-07-04 DIAGNOSIS — M25551 Pain in right hip: Secondary | ICD-10-CM

## 2021-07-04 DIAGNOSIS — M545 Low back pain, unspecified: Secondary | ICD-10-CM

## 2021-07-04 NOTE — Patient Instructions (Addendum)
Single Leg Balance: Eyes Open    Stand on right leg with eyes open. Hold 30 seconds. 5 reps 1-2 times per day.  http://ggbe.exer.[email or web address]   Copyright  VHI. All rights reserved.   

## 2021-07-04 NOTE — Therapy (Signed)
[facility] [address] [place], [place], [address] Phone: [phone]   Fax:  [phone]  Physical Therapy Treatment  Patient Details  Name: Bethany Ray MRN: [id number] Date of Birth: 1945/11/17 Referring Provider (PT): Suella Broad MD   Encounter Date: 07/04/2021   PT End of Session - 07/04/21 1012     Visit Number 3    Number of Visits 8    Date for PT Re-Evaluation 07/25/21    Authorization Type [employer]    Authorization Time Period 8 approved  9/20-10/18    Authorization - Visit Number 3    Authorization - Number of Visits 8    Progress Note Due on Visit 8    PT Start Time 1004    PT Stop Time 3500    PT Time Calculation (min) 41 min    Activity Tolerance Patient tolerated treatment well    Behavior During Therapy [facility] for tasks assessed/performed             Past Medical History:  Diagnosis Date   Arthritis    Breast cancer ([place]) 09/14/2013   Cancer ([place]) approx 2006   rt breast/lumpectomy/chemo/rad tx   Diabetes mellitus    High cholesterol    Hypertension    Shingles     Past Surgical History:  Procedure Laterality Date   ABDOMINAL HYSTERECTOMY     BREAST SURGERY     CHOLECYSTECTOMY     IR RADIOLOGY PERIPHERAL GUIDED IV START  11/18/2019   IR US GUIDE VASC ACCESS RIGHT  11/18/2019    There were no vitals filed for this visit.   Subjective Assessment - 07/04/21 1004     Subjective Pt stated Rt hip pain comes and goes, stated pain worse in morning and gets better through out the day.  Pain scale 3/10 soreness,    Patient Stated Goals Reduce pain    Currently in Pain? Yes    Pain Score 3     Pain Location Hip    Pain Orientation Right    Pain Descriptors / Indicators Aching;Sore    Pain Type Chronic pain    Pain Onset More than a month ago    Pain Frequency Intermittent    Aggravating Factors  standing    Pain Relieving Factors sitting in the recliner    Effect of Pain on Daily Activities  limits                [facility] PT Assessment - 07/04/21 0001       Assessment   Medical Diagnosis Lumbar degeneration    Referring Provider (PT) Suella Broad MD    Next MD Visit Early October                           [facility] Adult PT Treatment/Exercise - 07/04/21 0001       Posture/Postural Control   Posture/Postural Control Postural limitations    Postural Limitations Anterior pelvic tilt;Increased lumbar lordosis      Exercises   Exercises Lumbar      Lumbar Exercises: Stretches   Single Knee to Chest Stretch 2 reps;20 seconds    Standing Extension 10 reps;5 seconds      Lumbar Exercises: Standing   Heel Raises 15 reps    Functional Squats 10 reps    Functional Squats Limitations 3D hip excursion    Other Standing Lumbar Exercises SLS Lt 11", Rt 15"  Lumbar Exercises: Seated   Other Seated Lumbar Exercises pelvic tilt x 3 min      Lumbar Exercises: Supine   Ab Set 10 reps    Pelvic Tilt 10 reps    Pelvic Tilt Limitations difficult to acheive, improved following seated pelvic tilt    Bent Knee Raise 10 reps;3 seconds    Bent Knee Raise Limitations with ab set    Bridge 10 reps;5 seconds                       PT Short Term Goals - 06/29/21 1145       PT SHORT TERM GOAL #1   Title Patient will be independent with initial HEP and self-management strategies to improve functional outcomes    Time 2    Period Weeks    Status On-going    Target Date 07/11/21               PT Long Term Goals - 06/29/21 1145       PT LONG TERM GOAL #1   Title Patient will be independent with advanced HEP and self-management strategies to improve functional outcomes    Time 4    Period Weeks    Status On-going      PT LONG TERM GOAL #2   Title Patient will improve FOTO score to predicted value to indicate improvement in functional outcomes    Time 4    Period Weeks    Status On-going      PT LONG TERM GOAL #3   Title Patient  will report at least 70% overall improvement in subjective complaint to indicate improvement in ability to perform ADLs.    Time 4    Period Weeks    Status On-going      PT LONG TERM GOAL #4   Title Patient will have equal to or > 4+/5 MMT throughout BLE to improve ability to perform functional mobility, stair ambulation and ADLs.    Time 4    Period Weeks    Status On-going                   Plan - 07/04/21 1033     Clinical Impression Statement Pt wtih difficulty completing pelvic tilts, improved following verbal and tactile cueing and seated pelvic tilts.  Continued session with lumbar stability and mobility.  Pt able to complete all exercises with no reoprts of pain.    Examination-Activity Limitations Lift;Stand;Transfers;Locomotion Level;Bend;Squat;Stairs    Examination-Participation Restrictions Yard Work;Cleaning;Community Activity;Laundry;Shop    Stability/Clinical Decision Making Stable/Uncomplicated    Clinical Decision Making Low    Rehab Potential Good    PT Frequency 2x / week    PT Duration 4 weeks    PT Treatment/Interventions ADLs/Self Care Home Management;Biofeedback;Cryotherapy;Electrical Stimulation;Contrast Bath;Therapeutic exercise;Patient/family education;Orthotic Fit/Training;Compression bandaging;Neuromuscular re-education;Balance training;DME Instruction;Iontophoresis 4mg /ml Dexamethasone;Gait training;Stair training;Moist Heat;Functional mobility training;Traction;Ultrasound;Parrafin;Fluidtherapy;Therapeutic activities;Manual lymph drainage;Scar mobilization;Passive range of motion;Vestibular;Dry needling;Energy conservation;Splinting;Taping;Vasopneumatic Device;Joint Manipulations;Spinal Manipulations;Visual/perceptual remediation/compensation;Manual techniques    PT Next Visit Plan Progress hip and core strength as tolerated. Improve lumabr and hip mobility with stretching. Manual STM to target are to address pain and restriction.    PT Home Exercise  Plan Eval: ab brace, pelvic tilt, bridge, SKTC; 9/22: LTR and hip excursion; 9/27: SLS    Consulted and Agree with Plan of Care Patient             Patient will benefit from skilled therapeutic intervention in order to improve the following  deficits and impairments:  Pain, Improper body mechanics, Increased fascial restricitons, Postural dysfunction, Decreased mobility, Decreased activity tolerance, Decreased range of motion, Decreased strength, Hypomobility, Impaired flexibility  Visit Diagnosis: Pain in right hip  Low back pain, unspecified back pain laterality, unspecified chronicity, unspecified whether sciatica present     Problem List Patient Active Problem List   Diagnosis Date Noted   Colon cancer screening 05/25/2019   Chest pain 05/25/2019   Unilateral primary osteoarthritis, right knee 02/27/2017   Unilateral primary osteoarthritis, left knee 02/27/2017   Breast cancer of lower-inner quadrant of right female breast ([place]) 09/14/2013   STRESS FRACTURE-SITE 11/19/2007   ANKLE SPRAIN 11/19/2007   Ihor Austin, LPTA/CLT; CBIS [phone]  Aldona Lento, PTA 07/04/2021, 11:44 AM  [place], [place], [address] Phone: [phone]   Fax:  [phone]  Name: Bethany Ray MRN: [id number] Date of Birth: [birth year]

## 2021-07-05 DIAGNOSIS — B0052 Herpesviral keratitis: Secondary | ICD-10-CM | POA: Diagnosis not present

## 2021-07-06 ENCOUNTER — Other Ambulatory Visit: Payer: Self-pay

## 2021-07-06 ENCOUNTER — Ambulatory Visit (HOSPITAL_COMMUNITY): Payer: Medicare HMO | Admitting: Physical Therapy

## 2021-07-06 ENCOUNTER — Encounter (HOSPITAL_COMMUNITY): Payer: Medicare HMO | Admitting: Physical Therapy

## 2021-07-06 DIAGNOSIS — M25551 Pain in right hip: Secondary | ICD-10-CM

## 2021-07-06 DIAGNOSIS — M545 Low back pain, unspecified: Secondary | ICD-10-CM

## 2021-07-06 NOTE — Therapy (Signed)
[place], [place], [address] Phone: [phone]   Fax:  [phone]  Physical Therapy Treatment  Patient Details  Name: Bethany Ray MRN: [id number] Date of Birth: 1945/11/14 Referring Provider (PT): Suella Broad MD   Encounter Date: 07/06/2021   PT End of Session - 07/06/21 1558     Visit Number 4    Number of Visits 8    Date for PT Re-Evaluation 07/25/21    Authorization Type [employer]    Authorization Time Period 8 approved  9/20-10/18    Authorization - Visit Number 4    Authorization - Number of Visits 8    Progress Note Due on Visit 8    PT Start Time 1450    PT Stop Time 8416    PT Time Calculation (min) 45 min    Activity Tolerance Patient tolerated treatment well    Behavior During Therapy [facility] for tasks assessed/performed             Past Medical History:  Diagnosis Date   Arthritis    Breast cancer ([place]) 09/14/2013   Cancer ([place]) approx 2006   rt breast/lumpectomy/chemo/rad tx   Diabetes mellitus    High cholesterol    Hypertension    Shingles     Past Surgical History:  Procedure Laterality Date   ABDOMINAL HYSTERECTOMY     BREAST SURGERY     CHOLECYSTECTOMY     IR RADIOLOGY PERIPHERAL GUIDED IV START  11/18/2019   IR US GUIDE VASC ACCESS RIGHT  11/18/2019    There were no vitals filed for this visit.   Subjective Assessment - 07/06/21 1453     Subjective Pt states she's having soreness bilateral posterior LE's today.    Currently in Pain? No/denies                               [facility] Adult PT Treatment/Exercise - 07/06/21 0001       Lumbar Exercises: Stretches   Single Knee to Chest Stretch 2 reps;20 seconds      Lumbar Exercises: Standing   Heel Raises 10 reps    Functional Squats 10 reps    Functional Squats Limitations 3D hip excursion    Forward Lunge 10 reps;Limitations    Forward Lunge Limitations onto 4" step no UE assist     Other Standing Lumbar Exercises SLS bilaterally; tandem stance 30" each LE lead      Lumbar Exercises: Supine   Ab Set 10 reps    Pelvic Tilt 10 reps    Bent Knee Raise 10 reps;3 seconds    Bent Knee Raise Limitations with ab set    Bridge 10 reps;5 seconds    Straight Leg Raise 10 reps      Lumbar Exercises: Sidelying   Clam Both;10 reps;5 seconds    Hip Abduction Both;10 reps                       PT Short Term Goals - 06/29/21 1145       PT SHORT TERM GOAL #1   Title Patient will be independent with initial HEP and self-management strategies to improve functional outcomes    Time 2    Period Weeks    Status On-going    Target Date 07/11/21               PT Long  Term Goals - 06/29/21 1145       PT LONG TERM GOAL #1   Title Patient will be independent with advanced HEP and self-management strategies to improve functional outcomes    Time 4    Period Weeks    Status On-going      PT LONG TERM GOAL #2   Title Patient will improve FOTO score to predicted value to indicate improvement in functional outcomes    Time 4    Period Weeks    Status On-going      PT LONG TERM GOAL #3   Title Patient will report at least 70% overall improvement in subjective complaint to indicate improvement in ability to perform ADLs.    Time 4    Period Weeks    Status On-going      PT LONG TERM GOAL #4   Title Patient will have equal to or > 4+/5 MMT throughout BLE to improve ability to perform functional mobility, stair ambulation and ADLs.    Time 4    Period Weeks    Status On-going                   Plan - 07/06/21 1602     Clinical Impression Statement Began with standing mobility, balance and strengthening of bil LE's.    Added standing hip abduction and lunges. PT with cues for form and postural stabilization with hip abduction.  Pt able to complete lunges with good control without UE's and maintain tandem stance for 30" with each LE lead.  SLS  continues to be challenging for patient.  Hip abduction also added in sidelying with tactile cues from therapist to keep from rolling backward and using hip flexors.  Improved ability to complete pelvic tilts this session with les cues.  Pt reported no increased pain or issues at end of session.    Examination-Activity Limitations Lift;Stand;Transfers;Locomotion Level;Bend;Squat;Stairs    Examination-Participation Restrictions Yard Work;Cleaning;Community Activity;Laundry;Shop    Stability/Clinical Decision Making Stable/Uncomplicated    Rehab Potential Good    PT Frequency 2x / week    PT Duration 4 weeks    PT Treatment/Interventions ADLs/Self Care Home Management;Biofeedback;Cryotherapy;Electrical Stimulation;Contrast Bath;Therapeutic exercise;Patient/family education;Orthotic Fit/Training;Compression bandaging;Neuromuscular re-education;Balance training;DME Instruction;Iontophoresis 4mg /ml Dexamethasone;Gait training;Stair training;Moist Heat;Functional mobility training;Traction;Ultrasound;Parrafin;Fluidtherapy;Therapeutic activities;Manual lymph drainage;Scar mobilization;Passive range of motion;Vestibular;Dry needling;Energy conservation;Splinting;Taping;Vasopneumatic Device;Joint Manipulations;Spinal Manipulations;Visual/perceptual remediation/compensation;Manual techniques    PT Next Visit Plan Progress hip and core strength as tolerated. Improve lumabr and hip mobility with stretching. Manual STM to target are to address pain and restriction.  Progress tandem onto foam surface next session to increase challenge.    PT Home Exercise Plan Eval: ab brace, pelvic tilt, bridge, SKTC; 9/22: LTR and hip excursion; 9/27: SLS    Consulted and Agree with Plan of Care Patient             Patient will benefit from skilled therapeutic intervention in order to improve the following deficits and impairments:  Pain, Improper body mechanics, Increased fascial restricitons, Postural dysfunction, Decreased  mobility, Decreased activity tolerance, Decreased range of motion, Decreased strength, Hypomobility, Impaired flexibility  Visit Diagnosis: Pain in right hip  Low back pain, unspecified back pain laterality, unspecified chronicity, unspecified whether sciatica present     Problem List Patient Active Problem List   Diagnosis Date Noted   Colon cancer screening 05/25/2019   Chest pain 05/25/2019   Unilateral primary osteoarthritis, right knee 02/27/2017   Unilateral primary osteoarthritis, left knee 02/27/2017   Breast cancer of lower-inner quadrant  of right female breast ([place]) 09/14/2013   STRESS FRACTURE-SITE 11/19/2007   ANKLE SPRAIN 11/19/2007   Una Yeomans Sula Soda, PTA/CLT, WTA [phone]  Teena Irani, PTA 07/06/2021, 4:03 PM  [facility] [address] [place], [place], [address] Phone: [phone]   Fax:  [phone]  Name: SHIRELY TOREN MRN: [id number] Date of Birth: 09/17/1946

## 2021-07-07 DIAGNOSIS — E785 Hyperlipidemia, unspecified: Secondary | ICD-10-CM | POA: Diagnosis not present

## 2021-07-07 DIAGNOSIS — I10 Essential (primary) hypertension: Secondary | ICD-10-CM | POA: Diagnosis not present

## 2021-07-07 DIAGNOSIS — E119 Type 2 diabetes mellitus without complications: Secondary | ICD-10-CM | POA: Diagnosis not present

## 2021-07-11 DIAGNOSIS — N6321 Unspecified lump in the left breast, upper outer quadrant: Secondary | ICD-10-CM | POA: Diagnosis not present

## 2021-07-11 DIAGNOSIS — R928 Other abnormal and inconclusive findings on diagnostic imaging of breast: Secondary | ICD-10-CM | POA: Diagnosis not present

## 2021-07-11 DIAGNOSIS — N6002 Solitary cyst of left breast: Secondary | ICD-10-CM | POA: Diagnosis not present

## 2021-07-11 DIAGNOSIS — Z780 Asymptomatic menopausal state: Secondary | ICD-10-CM | POA: Diagnosis not present

## 2021-07-12 ENCOUNTER — Ambulatory Visit (HOSPITAL_COMMUNITY): Payer: Medicare HMO | Attending: Physical Medicine and Rehabilitation

## 2021-07-12 ENCOUNTER — Other Ambulatory Visit: Payer: Self-pay

## 2021-07-12 ENCOUNTER — Encounter (HOSPITAL_COMMUNITY): Payer: Self-pay

## 2021-07-12 DIAGNOSIS — M25551 Pain in right hip: Secondary | ICD-10-CM | POA: Diagnosis not present

## 2021-07-12 DIAGNOSIS — M545 Low back pain, unspecified: Secondary | ICD-10-CM | POA: Insufficient documentation

## 2021-07-12 NOTE — Patient Instructions (Signed)
HIP: Flexion / KNEE: Extension, Heel Strike - Standing      Standing tall bring leg forward, then out to side and then extend.  Hold each position 5-10" Repeat 5 times each side.  Tandem Stance    Right foot in front of left, heel touching toe both feet "straight ahead". Stand on Foot Triangle of Support with both feet. Balance in this position 30 seconds. Do with left foot in front of right.  Copyright  VHI. All rights reserved.

## 2021-07-12 NOTE — Therapy (Signed)
[facility] [address] [place], [place], [address] Phone: [phone]   Fax:  [phone]  Physical Therapy Treatment  Patient Details  Name: Bethany Ray MRN: [id number] Date of Birth: 1946/02/05 Referring Provider (PT): Suella Broad MD   Encounter Date: 07/12/2021   PT End of Session - 07/12/21 0927     Visit Number 5    Number of Visits 8    Date for PT Re-Evaluation 07/25/21    Authorization Type [employer]    Authorization Time Period 8 approved  9/20-10/18    Authorization - Visit Number 5    Authorization - Number of Visits 8    Progress Note Due on Visit 8    PT Start Time 0921    PT Stop Time 1000    PT Time Calculation (min) 39 min    Activity Tolerance Patient tolerated treatment well    Behavior During Therapy [facility] for tasks assessed/performed             Past Medical History:  Diagnosis Date   Arthritis    Breast cancer ([place]) 09/14/2013   Cancer ([place]) approx 2006   rt breast/lumpectomy/chemo/rad tx   Diabetes mellitus    High cholesterol    Hypertension    Shingles     Past Surgical History:  Procedure Laterality Date   ABDOMINAL HYSTERECTOMY     BREAST SURGERY     CHOLECYSTECTOMY     IR RADIOLOGY PERIPHERAL GUIDED IV START  11/18/2019   IR US GUIDE VASC ACCESS RIGHT  11/18/2019    There were no vitals filed for this visit.   Subjective Assessment - 07/12/21 0924     Subjective Pt stated she is feeling good today, no reports of pain.  Has been working on balance at home.    Patient Stated Goals Reduce pain    Currently in Pain? No/denies                               [facility] Adult PT Treatment/Exercise - 07/12/21 0001       Exercises   Exercises Lumbar      Lumbar Exercises: Standing   Heel Raises 15 reps    Functional Squats 10 reps    Functional Squats Limitations 3D hip excursion    Forward Lunge 10 reps;Limitations    Forward Lunge Limitations level floor,  no HHA. Cueing to reduce rotation    Other Standing Lumbar Exercises SLS 5x; vector stance    Other Standing Lumbar Exercises Tandem on foam 3x 30", Sidestep 2RT RTB      Lumbar Exercises: Seated   Sit to Stand 10 reps    Sit to Stand Limitations no HHA, eccentric control    Other Seated Lumbar Exercises pelvic tilt 10x 5"                       PT Short Term Goals - 06/29/21 1145       PT SHORT TERM GOAL #1   Title Patient will be independent with initial HEP and self-management strategies to improve functional outcomes    Time 2    Period Weeks    Status On-going    Target Date 07/11/21               PT Long Term Goals - 06/29/21 1145       PT LONG TERM GOAL #1  Title Patient will be independent with advanced HEP and self-management strategies to improve functional outcomes    Time 4    Period Weeks    Status On-going      PT LONG TERM GOAL #2   Title Patient will improve FOTO score to predicted value to indicate improvement in functional outcomes    Time 4    Period Weeks    Status On-going      PT LONG TERM GOAL #3   Title Patient will report at least 70% overall improvement in subjective complaint to indicate improvement in ability to perform ADLs.    Time 4    Period Weeks    Status On-going      PT LONG TERM GOAL #4   Title Patient will have equal to or > 4+/5 MMT throughout BLE to improve ability to perform functional mobility, stair ambulation and ADLs.    Time 4    Period Weeks    Status On-going                   Plan - 07/12/21 1007     Clinical Impression Statement Progressed hip stability, balalnce and strengthening with additional vector stance and sidestepping with resistance.  Pt tolerated well to session.  Min cueing for form and mechanics initially with demonstration and verbal cueing.  Began tandem stance on foam with intermittent HHA required due to LOB episodes.    Examination-Activity Limitations  Lift;Stand;Transfers;Locomotion Level;Bend;Squat;Stairs    Examination-Participation Restrictions Yard Work;Cleaning;Community Activity;Laundry;Shop    Stability/Clinical Decision Making Stable/Uncomplicated    Clinical Decision Making Low    Rehab Potential Good    PT Frequency 2x / week    PT Duration 4 weeks    PT Treatment/Interventions ADLs/Self Care Home Management;Biofeedback;Cryotherapy;Electrical Stimulation;Contrast Bath;Therapeutic exercise;Patient/family education;Orthotic Fit/Training;Compression bandaging;Neuromuscular re-education;Balance training;DME Instruction;Iontophoresis 4mg /ml Dexamethasone;Gait training;Stair training;Moist Heat;Functional mobility training;Traction;Ultrasound;Parrafin;Fluidtherapy;Therapeutic activities;Manual lymph drainage;Scar mobilization;Passive range of motion;Vestibular;Dry needling;Energy conservation;Splinting;Taping;Vasopneumatic Device;Joint Manipulations;Spinal Manipulations;Visual/perceptual remediation/compensation;Manual techniques    PT Next Visit Plan Progress hip and core strength as tolerated. Improve lumabr and hip mobility with stretching. Manual STM to target are to address pain and restriction.    PT Home Exercise Plan Eval: ab brace, pelvic tilt, bridge, SKTC; 9/22: LTR and hip excursion; 9/27: SLS             Patient will benefit from skilled therapeutic intervention in order to improve the following deficits and impairments:  Pain, Improper body mechanics, Increased fascial restricitons, Postural dysfunction, Decreased mobility, Decreased activity tolerance, Decreased range of motion, Decreased strength, Hypomobility, Impaired flexibility  Visit Diagnosis: Pain in right hip  Low back pain, unspecified back pain laterality, unspecified chronicity, unspecified whether sciatica present     Problem List Patient Active Problem List   Diagnosis Date Noted   Colon cancer screening 05/25/2019   Chest pain 05/25/2019    Unilateral primary osteoarthritis, right knee 02/27/2017   Unilateral primary osteoarthritis, left knee 02/27/2017   Breast cancer of lower-inner quadrant of right female breast ([place]) 09/14/2013   STRESS FRACTURE-SITE 11/19/2007   ANKLE SPRAIN 11/19/2007   Ihor Austin, LPTA/CLT; CBIS [phone]  Aldona Lento, PTA 07/12/2021, 10:12 AM  [facility] [address] [place], [place], [address] Phone: [phone]   Fax:  [phone]  Name: Bethany Ray MRN: [id number] Date of Birth: Apr 22, 1946

## 2021-07-14 ENCOUNTER — Other Ambulatory Visit: Payer: Self-pay

## 2021-07-14 ENCOUNTER — Ambulatory Visit (HOSPITAL_COMMUNITY): Payer: Medicare HMO

## 2021-07-14 DIAGNOSIS — M25551 Pain in right hip: Secondary | ICD-10-CM

## 2021-07-14 DIAGNOSIS — M545 Low back pain, unspecified: Secondary | ICD-10-CM | POA: Diagnosis not present

## 2021-07-14 NOTE — Therapy (Signed)
[facility] [address] [place], [place], [address] Phone: [phone]   Fax:  [phone]  Physical Therapy Treatment  Patient Details  Name: Bethany Ray MRN: [id number] Date of Birth: 05/05/1946 Referring Provider (PT): Suella Broad MD   Encounter Date: 07/14/2021   PT End of Session - 07/14/21 0950     Visit Number 6    Number of Visits 8    Date for PT Re-Evaluation 07/25/21    Authorization Type [employer]    Authorization Time Period 8 approved  9/20-10/18    Authorization - Visit Number 6    Authorization - Number of Visits 8    Progress Note Due on Visit 8    PT Start Time 0950    PT Stop Time 1030    PT Time Calculation (min) 40 min    Activity Tolerance Patient tolerated treatment well    Behavior During Therapy [facility] for tasks assessed/performed             Past Medical History:  Diagnosis Date   Arthritis    Breast cancer ([place]) 09/14/2013   Cancer ([place]) approx 2006   rt breast/lumpectomy/chemo/rad tx   Diabetes mellitus    High cholesterol    Hypertension    Shingles     Past Surgical History:  Procedure Laterality Date   ABDOMINAL HYSTERECTOMY     BREAST SURGERY     CHOLECYSTECTOMY     IR RADIOLOGY PERIPHERAL GUIDED IV START  11/18/2019   IR US GUIDE VASC ACCESS RIGHT  11/18/2019    There were no vitals filed for this visit.   Subjective Assessment - 07/14/21 0953     Subjective Pt notes her right buttock/hip pain has lessened and notes ability to stand/walk for longer periods of time    Patient Stated Goals Reduce pain    Currently in Pain? No/denies    Pain Score 0-No pain    Pain Type Chronic pain                [facility] PT Assessment - 07/14/21 0001       Assessment   Medical Diagnosis Lumbar degeneration    Referring Provider (PT) Suella Broad MD    Next MD Visit Early October                           [facility] Adult PT Treatment/Exercise - 07/14/21 0001        Ambulation/Gait   Gait velocity - backwards forwards/backwards in // bars x 2 min      Lumbar Exercises: Standing   Heel Raises 15 reps    Functional Squats 20 reps   2x10 with chair behind for guide   Forward Lunge 10 reps;Limitations    Forward Lunge Limitations HHA on // bars    Row Strengthening;Both;20 reps;Theraband    Theraband Level (Row) Level 2 (Red)    Row Limitations unilateal with staggered foot position    Other Standing Lumbar Exercises SLS 3x10 sec left/right    Other Standing Lumbar Exercises tandem stance on foam 2x15 sec left/right. Paloff press 1x10 with red band                     PT Education - 07/14/21 1025     Education Details HEP additions reviewed and printout developed    Person(s) Educated Patient    Methods Explanation;Demonstration;Handout    Comprehension Verbalized understanding  PT Short Term Goals - 06/29/21 1145       PT SHORT TERM GOAL #1   Title Patient will be independent with initial HEP and self-management strategies to improve functional outcomes    Time 2    Period Weeks    Status On-going    Target Date 07/11/21               PT Long Term Goals - 06/29/21 1145       PT LONG TERM GOAL #1   Title Patient will be independent with advanced HEP and self-management strategies to improve functional outcomes    Time 4    Period Weeks    Status On-going      PT LONG TERM GOAL #2   Title Patient will improve FOTO score to predicted value to indicate improvement in functional outcomes    Time 4    Period Weeks    Status On-going      PT LONG TERM GOAL #3   Title Patient will report at least 70% overall improvement in subjective complaint to indicate improvement in ability to perform ADLs.    Time 4    Period Weeks    Status On-going      PT LONG TERM GOAL #4   Title Patient will have equal to or > 4+/5 MMT throughout BLE to improve ability to perform functional mobility, stair  ambulation and ADLs.    Time 4    Period Weeks    Status On-going                   Plan - 07/14/21 1025     Clinical Impression Statement Tolerating tx sessions well and progressing with core strengthening/stabilization exercises to improve recruitment to prepare for more dynamic and demanding functional lifts to reduce risk for re-injury    Examination-Activity Limitations Lift;Stand;Transfers;Locomotion Level;Bend;Squat;Stairs    Examination-Participation Restrictions Yard Work;Cleaning;Community Activity;Laundry;Shop    Stability/Clinical Decision Making Stable/Uncomplicated    Rehab Potential Good    PT Frequency 2x / week    PT Duration 4 weeks    PT Treatment/Interventions ADLs/Self Care Home Management;Biofeedback;Cryotherapy;Electrical Stimulation;Contrast Bath;Therapeutic exercise;Patient/family education;Orthotic Fit/Training;Compression bandaging;Neuromuscular re-education;Balance training;DME Instruction;Iontophoresis 4mg /ml Dexamethasone;Gait training;Stair training;Moist Heat;Functional mobility training;Traction;Ultrasound;Parrafin;Fluidtherapy;Therapeutic activities;Manual lymph drainage;Scar mobilization;Passive range of motion;Vestibular;Dry needling;Energy conservation;Splinting;Taping;Vasopneumatic Device;Joint Manipulations;Spinal Manipulations;Visual/perceptual remediation/compensation;Manual techniques    PT Next Visit Plan Progress hip and core strength as tolerated. Improve lumabr and hip mobility with stretching. Manual STM to target are to address pain and restriction.    PT Home Exercise Plan Eval: ab brace, pelvic tilt, bridge, SKTC; 9/22: LTR and hip excursion; 9/27: SLS             Patient will benefit from skilled therapeutic intervention in order to improve the following deficits and impairments:  Pain, Improper body mechanics, Increased fascial restricitons, Postural dysfunction, Decreased mobility, Decreased activity tolerance, Decreased range of  motion, Decreased strength, Hypomobility, Impaired flexibility  Visit Diagnosis: Pain in right hip  Low back pain, unspecified back pain laterality, unspecified chronicity, unspecified whether sciatica present     Problem List Patient Active Problem List   Diagnosis Date Noted   Colon cancer screening 05/25/2019   Chest pain 05/25/2019   Unilateral primary osteoarthritis, right knee 02/27/2017   Unilateral primary osteoarthritis, left knee 02/27/2017   Breast cancer of lower-inner quadrant of right female breast ([place]) 09/14/2013   STRESS FRACTURE-SITE 11/19/2007   ANKLE SPRAIN 11/19/2007    Toniann Fail, PT 07/14/2021, 10:27 AM  [facility] [place], [place], [address] Phone: [phone]   Fax:  [phone]  Name: Bethany Ray MRN: [id number] Date of Birth: 09/26/1946

## 2021-07-18 ENCOUNTER — Ambulatory Visit (HOSPITAL_COMMUNITY): Payer: Medicare HMO | Admitting: Physical Therapy

## 2021-07-18 ENCOUNTER — Other Ambulatory Visit: Payer: Self-pay

## 2021-07-18 ENCOUNTER — Encounter (HOSPITAL_COMMUNITY): Payer: Self-pay | Admitting: Physical Therapy

## 2021-07-18 DIAGNOSIS — M545 Low back pain, unspecified: Secondary | ICD-10-CM | POA: Diagnosis not present

## 2021-07-18 DIAGNOSIS — M25551 Pain in right hip: Secondary | ICD-10-CM | POA: Diagnosis not present

## 2021-07-18 NOTE — Patient Instructions (Signed)
Access Code: [email or web address] URL: [email or web address] Date: 07/18/2021 Prepared by: Josue Hector  Exercises Side Stepping with Counter Support - 2 x daily - 7 x weekly - 1 sets - 10 reps Standing Tandem Balance with Counter Support - 2 x daily - 7 x weekly - 1 sets - 3 reps - 30 second hold Seated Transversus Abdominis Bracing - 2 x daily - 7 x weekly - 2 sets - 10 reps

## 2021-07-18 NOTE — Therapy (Signed)
[facility] [address] [place], [place], [address] Phone: [phone]   Fax:  [phone]  Physical Therapy Treatment  Patient Details  Name: Bethany Ray MRN: [id number] Date of Birth: Aug 05, 1946 Referring Provider (PT): Suella Broad MD   Encounter Date: 07/18/2021   PT End of Session - 07/18/21 0909     Visit Number 7    Number of Visits 8    Date for PT Re-Evaluation 07/25/21    Authorization Type [employer]    Authorization Time Period 8 approved  9/20-10/18    Authorization - Visit Number 7    Authorization - Number of Visits 8    Progress Note Due on Visit 8    PT Start Time 0903    PT Stop Time 0945    PT Time Calculation (min) 42 min    Activity Tolerance Patient tolerated treatment well    Behavior During Therapy [facility] for tasks assessed/performed             Past Medical History:  Diagnosis Date   Arthritis    Breast cancer ([place]) 09/14/2013   Cancer ([place]) approx 2006   rt breast/lumpectomy/chemo/rad tx   Diabetes mellitus    High cholesterol    Hypertension    Shingles     Past Surgical History:  Procedure Laterality Date   ABDOMINAL HYSTERECTOMY     BREAST SURGERY     CHOLECYSTECTOMY     IR RADIOLOGY PERIPHERAL GUIDED IV START  11/18/2019   IR US GUIDE VASC ACCESS RIGHT  11/18/2019    There were no vitals filed for this visit.   Subjective Assessment - 07/18/21 0908     Subjective Patient says she is doing alright. She still has some pain in her back. Feels like she still needs to work on her balance some.    Limitations Sitting;Lifting;Standing;Walking;House hold activities    Patient Stated Goals Reduce pain    Currently in Pain? Yes    Pain Score 4     Pain Location Back    Pain Orientation Posterior;Lower    Pain Descriptors / Indicators Aching    Pain Type Chronic pain                               [facility] Adult PT Treatment/Exercise - 07/18/21 0001        Lumbar Exercises: Stretches   Gastroc Stretch 3 reps;30 seconds;Right;Left   on incline slope   Other Lumbar Stretch Exercise lumbar flexion stretch 5 x 10" (seated)      Lumbar Exercises: Standing   Heel Raises 20 reps    Forward Lunge 20 reps    Forward Lunge Limitations HHA on // bars    Other Standing Lumbar Exercises sidestepping in // bars 5 RT    Other Standing Lumbar Exercises tandem stance with head turns 2 x 10 (int HHA)      Lumbar Exercises: Seated   Other Seated Lumbar Exercises seated ab brace 2x15 5", alt UE/ LE raises seated with ab brace 2 x15                       PT Short Term Goals - 06/29/21 1145       PT SHORT TERM GOAL #1   Title Patient will be independent with initial HEP and self-management strategies to improve functional outcomes    Time 2  Period Weeks    Status On-going    Target Date 07/11/21               PT Long Term Goals - 06/29/21 1145       PT LONG TERM GOAL #1   Title Patient will be independent with advanced HEP and self-management strategies to improve functional outcomes    Time 4    Period Weeks    Status On-going      PT LONG TERM GOAL #2   Title Patient will improve FOTO score to predicted value to indicate improvement in functional outcomes    Time 4    Period Weeks    Status On-going      PT LONG TERM GOAL #3   Title Patient will report at least 70% overall improvement in subjective complaint to indicate improvement in ability to perform ADLs.    Time 4    Period Weeks    Status On-going      PT LONG TERM GOAL #4   Title Patient will have equal to or > 4+/5 MMT throughout BLE to improve ability to perform functional mobility, stair ambulation and ADLs.    Time 4    Period Weeks    Status On-going                   Plan - 07/18/21 0947     Clinical Impression Statement Patient tolerated well. Progressed static balance with tandem stance head turns to further challenge patient's  balance. Introduced to seated ab brace with alternating UE/LE lifting to challenge core. Educated patient on utilization of gastroc stretching and lumbar flexion stretch for whenever she begins to feel tight. Patient is making good progress towards goals and strengthening for improved functionality and completion of ADLs.    Examination-Activity Limitations Lift;Stand;Transfers;Locomotion Level;Bend;Squat;Stairs    Examination-Participation Restrictions Yard Work;Cleaning;Community Activity;Laundry;Shop    Stability/Clinical Decision Making Stable/Uncomplicated    Rehab Potential Good    PT Frequency 2x / week    PT Duration 4 weeks    PT Treatment/Interventions ADLs/Self Care Home Management;Biofeedback;Cryotherapy;Electrical Stimulation;Contrast Bath;Therapeutic exercise;Patient/family education;Orthotic Fit/Training;Compression bandaging;Neuromuscular re-education;Balance training;DME Instruction;Iontophoresis 4mg /ml Dexamethasone;Gait training;Stair training;Moist Heat;Functional mobility training;Traction;Ultrasound;Parrafin;Fluidtherapy;Therapeutic activities;Manual lymph drainage;Scar mobilization;Passive range of motion;Vestibular;Dry needling;Energy conservation;Splinting;Taping;Vasopneumatic Device;Joint Manipulations;Spinal Manipulations;Visual/perceptual remediation/compensation;Manual techniques    PT Next Visit Plan Reassess next visit, adjust POC as indicated.    PT Home Exercise Plan Eval: ab brace, pelvic tilt, bridge, SKTC; 9/22: LTR and hip excursion; 9/27: SLS 10/11 sidestepping, tandem stance, seated UE/LE raise    Consulted and Agree with Plan of Care Patient             Patient will benefit from skilled therapeutic intervention in order to improve the following deficits and impairments:  Pain, Improper body mechanics, Increased fascial restricitons, Postural dysfunction, Decreased mobility, Decreased activity tolerance, Decreased range of motion, Decreased strength,  Hypomobility, Impaired flexibility  Visit Diagnosis: Pain in right hip  Low back pain, unspecified back pain laterality, unspecified chronicity, unspecified whether sciatica present     Problem List Patient Active Problem List   Diagnosis Date Noted   Colon cancer screening 05/25/2019   Chest pain 05/25/2019   Unilateral primary osteoarthritis, right knee 02/27/2017   Unilateral primary osteoarthritis, left knee 02/27/2017   Breast cancer of lower-inner quadrant of right female breast ([place]) 09/14/2013   STRESS FRACTURE-SITE 11/19/2007   ANKLE SPRAIN 11/19/2007   10:27 AM, 07/18/21 Josue Hector PT DPT  Physical Therapist with [facility]  Hospital  [phone]  [facility] [address] [place], [place], [address] Phone: [phone]   Fax:  [phone]  Name: Bethany Ray MRN: [id number] Date of Birth: 1946-09-22

## 2021-07-20 ENCOUNTER — Encounter (HOSPITAL_COMMUNITY): Payer: Medicare HMO | Admitting: Physical Therapy

## 2021-07-25 ENCOUNTER — Ambulatory Visit (HOSPITAL_COMMUNITY): Payer: Medicare HMO | Admitting: Physical Therapy

## 2021-07-25 ENCOUNTER — Encounter (HOSPITAL_COMMUNITY): Payer: Self-pay | Admitting: Physical Therapy

## 2021-07-25 ENCOUNTER — Other Ambulatory Visit: Payer: Self-pay

## 2021-07-25 DIAGNOSIS — M545 Low back pain, unspecified: Secondary | ICD-10-CM

## 2021-07-25 DIAGNOSIS — M25551 Pain in right hip: Secondary | ICD-10-CM

## 2021-07-25 NOTE — Therapy (Signed)
[facility] [address] [place], [place], [address] Phone: [phone]   Fax:  [phone]  Physical Therapy Treatment  Patient Details  Name: Bethany Ray MRN: [id number] Date of Birth: 11/14/1945 Referring Provider (PT): Suella Broad MD  Progress Note Reporting Period 06/27/21 to 07/25/21  See note below for Objective Data and Assessment of Progress/Goals.     Bethany Ray   [id number]  DOB: Dec 21, 1945  Date of Service: 07/25/2021     Encounter Date: 07/25/2021   PT End of Session - 07/25/21 0914     Visit Number 8    Number of Visits 12    Date for PT Re-Evaluation 08/22/21    Authorization Type [employer]    Authorization Time Period resubmitted 4 visits on 10/18, check auth    Authorization - Visit Number 8    Authorization - Number of Visits 8    Progress Note Due on Visit 12    PT Start Time 0908    PT Stop Time 0946    PT Time Calculation (min) 38 min    Activity Tolerance Patient tolerated treatment well    Behavior During Therapy [facility] for tasks assessed/performed             Past Medical History:  Diagnosis Date   Arthritis    Breast cancer ([place]) 09/14/2013   Cancer ([place]) approx 2006   rt breast/lumpectomy/chemo/rad tx   Diabetes mellitus    High cholesterol    Hypertension    Shingles     Past Surgical History:  Procedure Laterality Date   ABDOMINAL HYSTERECTOMY     BREAST SURGERY     CHOLECYSTECTOMY     IR RADIOLOGY PERIPHERAL GUIDED IV START  11/18/2019   IR US GUIDE VASC ACCESS RIGHT  11/18/2019    There were no vitals filed for this visit.   Subjective Assessment - 07/25/21 0914     Subjective Patient report 70% overall improvement since starting therapy. Reports less hip pain, states movement is still stiff occasionally.    Limitations Sitting;Lifting;Standing;Walking;House hold activities    Patient Stated Goals Reduce pain    Currently in Pain? Yes    Pain Score 6      Pain Location Back    Pain Orientation Posterior;Lower    Pain Descriptors / Indicators Aching    Pain Type Chronic pain    Pain Onset More than a month ago                [facility] PT Assessment - 07/25/21 0001       Assessment   Medical Diagnosis Lumbar degeneration    Referring Provider (PT) Suella Broad MD    Prior Therapy Yes      Precautions   Precautions None      Restrictions   Weight Bearing Restrictions No      Balance Screen   Has the patient fallen in the past 6 months No      [place] residence      Prior Function   Level of Independence Independent      Cognition   Overall Cognitive Status Within Functional Limits for tasks assessed      Observation/Other Assessments   Focus on Therapeutic Outcomes (FOTO)  40% function   was 43% function     Strength   Right Hip Flexion 4+/5    Right Hip Extension 4/5    Right Hip ABduction 4/5  Left Hip Flexion 4+/5    Left Hip Extension 4/5    Left Hip ABduction 4/5    Right Knee Extension 4+/5    Left Knee Extension 4+/5                           [facility] Adult PT Treatment/Exercise - 07/25/21 0001       Lumbar Exercises: Stretches   Piriformis Stretch Right;3 reps;30 seconds      Lumbar Exercises: Seated   Sit to Stand 10 reps      Lumbar Exercises: Supine   Ab Set 10 reps    Bent Knee Raise 10 reps    Bridge 10 reps;5 seconds      Lumbar Exercises: Sidelying   Hip Abduction Both;10 reps                       PT Short Term Goals - 07/25/21 0940       PT SHORT TERM GOAL #1   Title Patient will be independent with initial HEP and self-management strategies to improve functional outcomes    Baseline Reports compliance, demos fairly good return    Time 2    Period Weeks    Status Achieved    Target Date 07/11/21               PT Long Term Goals - 07/25/21 0940       PT LONG TERM GOAL #1   Title Patient will be  independent with advanced HEP and self-management strategies to improve functional outcomes    Time 4    Period Weeks    Status On-going      PT LONG TERM GOAL #2   Title Patient will improve FOTO score to predicted value to indicate improvement in functional outcomes    Time 4    Period Weeks    Status On-going      PT LONG TERM GOAL #3   Title Patient will report at least 70% overall improvement in subjective complaint to indicate improvement in ability to perform ADLs.    Baseline Current 60-70%    Time 4    Period Weeks    Status Partially Met      PT LONG TERM GOAL #4   Title Patient will have equal to or > 4+/5 MMT throughout BLE to improve ability to perform functional mobility, stair ambulation and ADLs.    Baseline See MMT    Time 4    Period Weeks    Status Partially Met                   Plan - 07/25/21 0944     Clinical Impression Statement Patient demos moderate progress to therapy goals. Reports significant improvement in subjective but shows minimal change in FOTO scoring. Does show improved MMTs, but continues to be limited by core and hip extension weakness, mostly on RT side. At this time, patient would continue to benefit from skilled therapy services to address remaining deficits to reduce pain and improve LOF with ADLs.    Examination-Activity Limitations Lift;Stand;Transfers;Locomotion Level;Bend;Squat;Stairs    Examination-Participation Restrictions Yard Work;Cleaning;Community Activity;Laundry;Shop    Stability/Clinical Decision Making Stable/Uncomplicated    Rehab Potential Good    PT Frequency 1x / week    PT Duration 4 weeks    PT Treatment/Interventions ADLs/Self Care Home Management;Biofeedback;Cryotherapy;Electrical Stimulation;Contrast Bath;Therapeutic exercise;Patient/family education;Orthotic Fit/Training;Compression bandaging;Neuromuscular re-education;Balance training;DME Instruction;Iontophoresis 77m/ml Dexamethasone;Gait training;Stair  training;Moist Heat;Functional mobility training;Traction;Ultrasound;Parrafin;Fluidtherapy;Therapeutic activities;Manual lymph drainage;Scar mobilization;Passive range of motion;Vestibular;Dry needling;Energy conservation;Splinting;Taping;Vasopneumatic Device;Joint Manipulations;Spinal Manipulations;Visual/perceptual remediation/compensation;Manual techniques    PT Next Visit Plan Continue to progress hip and core strength as tolerated. Functional strength and balance, manual to RT hip/ glute to address pain and restriction. Stair training when ready    PT Home Exercise Plan Eval: ab brace, pelvic tilt, bridge, SKTC; 9/22: LTR and hip excursion; 9/27: SLS 10/11 sidestepping, tandem stance, seated UE/LE raise 10/18 side lying hip abduction, piriformis stretch    Consulted and Agree with Plan of Care Patient             Patient will benefit from skilled therapeutic intervention in order to improve the following deficits and impairments:  Pain, Improper body mechanics, Increased fascial restricitons, Postural dysfunction, Decreased mobility, Decreased activity tolerance, Decreased range of motion, Decreased strength, Hypomobility, Impaired flexibility  Visit Diagnosis: Pain in right hip  Low back pain, unspecified back pain laterality, unspecified chronicity, unspecified whether sciatica present     Problem List Patient Active Problem List   Diagnosis Date Noted   Colon cancer screening 05/25/2019   Chest pain 05/25/2019   Unilateral primary osteoarthritis, right knee 02/27/2017   Unilateral primary osteoarthritis, left knee 02/27/2017   Breast cancer of lower-inner quadrant of right female breast ([place]) 09/14/2013   STRESS FRACTURE-SITE 11/19/2007   ANKLE SPRAIN 11/19/2007   9:52 AM, 07/25/21 Josue Hector PT DPT  Physical Therapist with [facility]  [phone] [place], [place],  [address] Phone: [phone]   Fax:  [phone]  Name: Bethany Ray MRN: [id number] Date of Birth: [birth year]

## 2021-07-25 NOTE — Patient Instructions (Signed)
Access Code: [email or web address] URL: [email or web address] Date: 07/25/2021 Prepared by: Josue Hector  Exercises Sidelying Hip Abduction - 2 x daily - 7 x weekly - 2 sets - 10 reps Supine Figure 4 Piriformis Stretch - 2 x daily - 7 x weekly - 1 sets - 3 reps - 30 second hold

## 2021-07-27 ENCOUNTER — Encounter (HOSPITAL_COMMUNITY): Payer: Medicare HMO | Admitting: Physical Therapy

## 2021-07-30 IMAGING — US IR RADIOLOGY PERIPHERAL GUIDED IV START
1 series · 1 of 1 positions shown · IV contrast (agent unspecified)
Comparison: none

CLINICAL DATA: Poor venous access

EXAM:
ULTRASOUND GUIDED VENIPUNCTURE BY TIGER
CONTRAST:  None
COMPLICATIONS:
None immediate
TECHNIQUE: The left upper arm was prepped with chlorhexidine in a sterile
fashion, and a sterile drape was applied covering the operative
field. Local anesthesia was provided with 1% lidocaine.

[Series 1: ir radiology peripheral guided iv start · 1 of 1 slices shown]
[im 1/1]
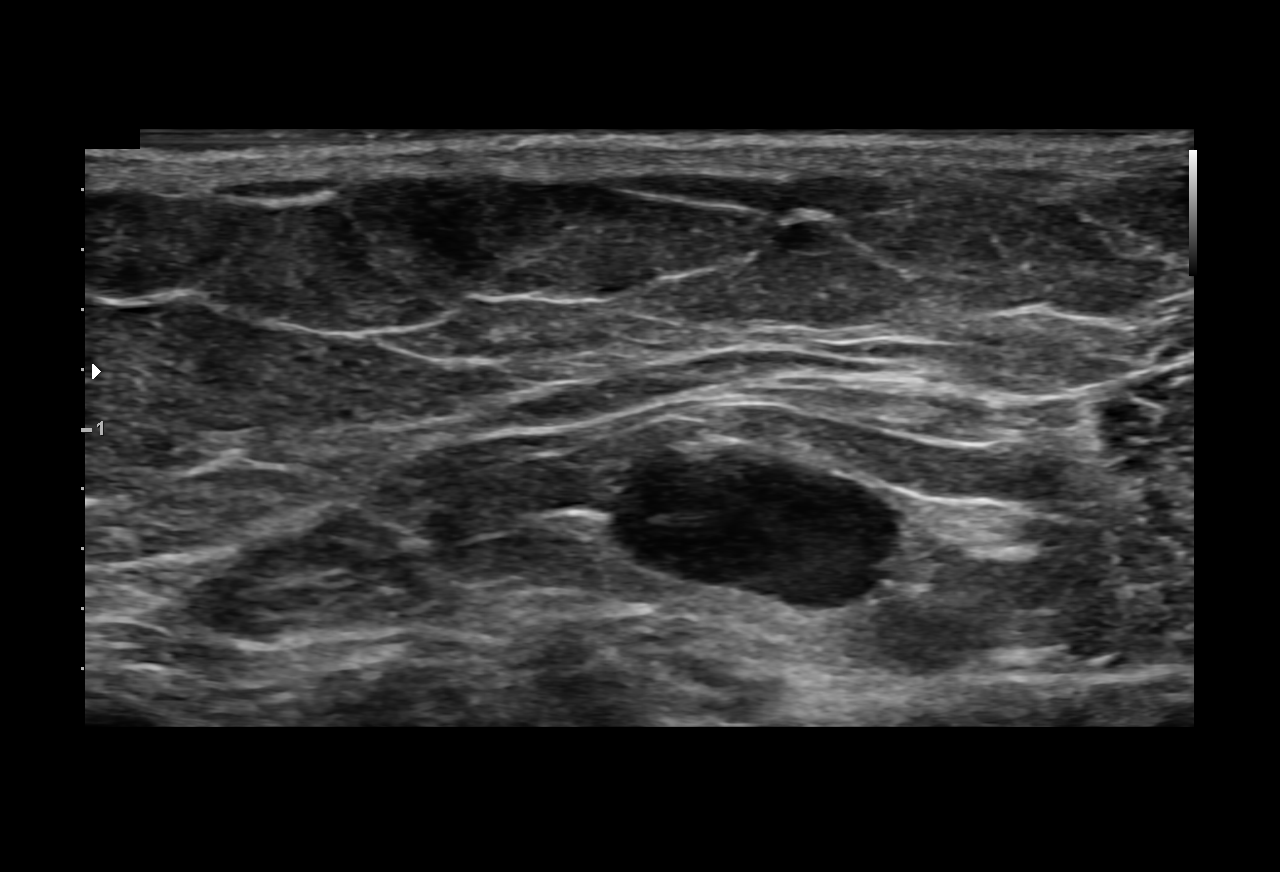

[1 of 1 positions shown; findings below may reference images not displayed]

Under direct ultrasound guidance, the left basilic vein was accessed
with a micropuncture kit after the overlying soft tissues were
anesthetized with 1% lidocaine. An ultrasound image was saved for
documentation purposes. The micropuncture sheath easily aspirated
and flushed and was secured in place. A dressing was placed. The
patient tolerated the procedure well without immediate post
procedural complication.
IMPRESSION: Successful ultrasound guided placement of a left basilic vein
approach micropuncture sheath for temporary venous access.

## 2021-07-31 DIAGNOSIS — E1169 Type 2 diabetes mellitus with other specified complication: Secondary | ICD-10-CM | POA: Diagnosis not present

## 2021-07-31 DIAGNOSIS — Z13820 Encounter for screening for osteoporosis: Secondary | ICD-10-CM | POA: Diagnosis not present

## 2021-07-31 DIAGNOSIS — M0579 Rheumatoid arthritis with rheumatoid factor of multiple sites without organ or systems involvement: Secondary | ICD-10-CM | POA: Diagnosis not present

## 2021-07-31 DIAGNOSIS — M25569 Pain in unspecified knee: Secondary | ICD-10-CM | POA: Diagnosis not present

## 2021-07-31 DIAGNOSIS — M79643 Pain in unspecified hand: Secondary | ICD-10-CM | POA: Diagnosis not present

## 2021-07-31 DIAGNOSIS — M170 Bilateral primary osteoarthritis of knee: Secondary | ICD-10-CM | POA: Diagnosis not present

## 2021-07-31 DIAGNOSIS — M549 Dorsalgia, unspecified: Secondary | ICD-10-CM | POA: Diagnosis not present

## 2021-07-31 DIAGNOSIS — E785 Hyperlipidemia, unspecified: Secondary | ICD-10-CM | POA: Diagnosis not present

## 2021-07-31 DIAGNOSIS — Z79899 Other long term (current) drug therapy: Secondary | ICD-10-CM | POA: Diagnosis not present

## 2021-08-02 ENCOUNTER — Encounter (HOSPITAL_COMMUNITY): Payer: Medicare HMO | Admitting: Physical Therapy

## 2021-08-07 DIAGNOSIS — I10 Essential (primary) hypertension: Secondary | ICD-10-CM | POA: Diagnosis not present

## 2021-08-07 DIAGNOSIS — B0052 Herpesviral keratitis: Secondary | ICD-10-CM | POA: Diagnosis not present

## 2021-08-07 DIAGNOSIS — E785 Hyperlipidemia, unspecified: Secondary | ICD-10-CM | POA: Diagnosis not present

## 2021-08-07 DIAGNOSIS — E119 Type 2 diabetes mellitus without complications: Secondary | ICD-10-CM | POA: Diagnosis not present

## 2021-08-10 ENCOUNTER — Encounter (HOSPITAL_COMMUNITY): Payer: Self-pay

## 2021-08-10 ENCOUNTER — Other Ambulatory Visit: Payer: Self-pay

## 2021-08-10 ENCOUNTER — Ambulatory Visit (HOSPITAL_COMMUNITY): Payer: Medicare HMO | Attending: Physical Medicine and Rehabilitation

## 2021-08-10 DIAGNOSIS — M25551 Pain in right hip: Secondary | ICD-10-CM | POA: Diagnosis not present

## 2021-08-10 DIAGNOSIS — M545 Low back pain, unspecified: Secondary | ICD-10-CM

## 2021-08-10 DIAGNOSIS — E785 Hyperlipidemia, unspecified: Secondary | ICD-10-CM | POA: Diagnosis not present

## 2021-08-10 DIAGNOSIS — E1129 Type 2 diabetes mellitus with other diabetic kidney complication: Secondary | ICD-10-CM | POA: Diagnosis not present

## 2021-08-10 DIAGNOSIS — Z79899 Other long term (current) drug therapy: Secondary | ICD-10-CM | POA: Diagnosis not present

## 2021-08-10 DIAGNOSIS — I10 Essential (primary) hypertension: Secondary | ICD-10-CM | POA: Diagnosis not present

## 2021-08-10 NOTE — Therapy (Signed)
[facility] [address] [place], [place], [address] Phone: [phone]   Fax:  [phone]  Physical Therapy Treatment  Patient Details  Name: Bethany Ray MRN: [id number] Date of Birth: 1946/05/26 Referring Provider (PT): Suella Broad MD   Encounter Date: 08/10/2021   PT End of Session - 08/10/21 1453     Visit Number 9    Number of Visits 12    Date for PT Re-Evaluation 08/22/21    Authorization Type [employer]    Authorization Time Period 4 visits approved 10/18-->08/22/21    Authorization - Visit Number 1    Authorization - Number of Visits 4    Progress Note Due on Visit 12    PT Start Time 5974    PT Stop Time 1638    PT Time Calculation (min) 38 min    Activity Tolerance Patient tolerated treatment well    Behavior During Therapy [facility] for tasks assessed/performed             Past Medical History:  Diagnosis Date   Arthritis    Breast cancer ([place]) 09/14/2013   Cancer ([place]) approx 2006   rt breast/lumpectomy/chemo/rad tx   Diabetes mellitus    High cholesterol    Hypertension    Shingles     Past Surgical History:  Procedure Laterality Date   ABDOMINAL HYSTERECTOMY     BREAST SURGERY     CHOLECYSTECTOMY     IR RADIOLOGY PERIPHERAL GUIDED IV START  11/18/2019   IR US GUIDE VASC ACCESS RIGHT  11/18/2019    There were no vitals filed for this visit.   Subjective Assessment - 08/10/21 1452     Subjective Pt stated she has intermittent pain in hip, pain scale 2/10 today    Patient Stated Goals Reduce pain    Currently in Pain? Yes    Pain Score 2     Pain Location Hip    Pain Orientation Right    Pain Descriptors / Indicators Dull    Pain Type Chronic pain    Pain Onset More than a month ago    Pain Frequency Intermittent    Aggravating Factors  standing    Pain Relieving Factors sitting in the recliner    Effect of Pain on Daily Activities limits                [facility] PT Assessment -  08/10/21 0001       Assessment   Medical Diagnosis Lumbar degeneration    Referring Provider (PT) Suella Broad MD    Next MD Visit following therapy    Prior Therapy Yes      Precautions   Precautions None                           [facility] Adult PT Treatment/Exercise - 08/10/21 0001       Lumbar Exercises: Standing   Heel Raises 20 reps    Heel Raises Limitations Toe raise on slope 10x 3"    Functional Squats 10 reps   2x 10 with chair behind   Other Standing Lumbar Exercises 4in step up 1 HHA 10x BLE    Other Standing Lumbar Exercises tandem stance on foam 2x 30", 1 set with 10 head turns      Lumbar Exercises: Seated   Sit to Stand 10 reps    Sit to Stand Limitations no HHA, eccentric control  Balance Exercises - 08/10/21 0001       Balance Exercises: Standing   Tandem Stance 3 reps;30 secs;Foam/compliant surface;Eyes open    SLS Eyes open;1 rep   Lt 40", Rt 39"   SLS with Vectors 3 reps;Intermittent upper extremity assist   3x 5"   Sidestepping 3 reps;Theraband   RTB around thigh   Sit to Stand Standard surface;Without upper extremity support   10 x                 PT Short Term Goals - 07/25/21 0940       PT SHORT TERM GOAL #1   Title Patient will be independent with initial HEP and self-management strategies to improve functional outcomes    Baseline Reports compliance, demos fairly good return    Time 2    Period Weeks    Status Achieved    Target Date 07/11/21               PT Long Term Goals - 07/25/21 0940       PT LONG TERM GOAL #1   Title Patient will be independent with advanced HEP and self-management strategies to improve functional outcomes    Time 4    Period Weeks    Status On-going      PT LONG TERM GOAL #2   Title Patient will improve FOTO score to predicted value to indicate improvement in functional outcomes    Time 4    Period Weeks    Status On-going      PT LONG TERM GOAL #3    Title Patient will report at least 70% overall improvement in subjective complaint to indicate improvement in ability to perform ADLs.    Baseline Current 60-70%    Time 4    Period Weeks    Status Partially Met      PT LONG TERM GOAL #4   Title Patient will have equal to or > 4+/5 MMT throughout BLE to improve ability to perform functional mobility, stair ambulation and ADLs.    Baseline See MMT    Time 4    Period Weeks    Status Partially Met                   Plan - 08/10/21 1903     Clinical Impression Statement Session focus with balance and functional hip strengthening exercises.  Pt tolerated well wtih no c/o pain through sessoin.  Added resistance with side step, vector stance, tandem on dynamic surface and step up training for functional strengthening.  Pt was limited by fatigue at EOS, did require a couple of seated rest breaks.    Examination-Activity Limitations Lift;Stand;Transfers;Locomotion Level;Bend;Squat;Stairs    Examination-Participation Restrictions Yard Work;Cleaning;Community Activity;Laundry;Shop    Stability/Clinical Decision Making Stable/Uncomplicated    Clinical Decision Making Low    Rehab Potential Good    PT Frequency 1x / week    PT Duration 4 weeks    PT Treatment/Interventions ADLs/Self Care Home Management;Biofeedback;Cryotherapy;Electrical Stimulation;Contrast Bath;Therapeutic exercise;Patient/family education;Orthotic Fit/Training;Compression bandaging;Neuromuscular re-education;Balance training;DME Instruction;Iontophoresis 16m/ml Dexamethasone;Gait training;Stair training;Moist Heat;Functional mobility training;Traction;Ultrasound;Parrafin;Fluidtherapy;Therapeutic activities;Manual lymph drainage;Scar mobilization;Passive range of motion;Vestibular;Dry needling;Energy conservation;Splinting;Taping;Vasopneumatic Device;Joint Manipulations;Spinal Manipulations;Visual/perceptual remediation/compensation;Manual techniques    PT Next Visit Plan  Continue to progress hip and core strength as tolerated. Functional strength and balance, manual to RT hip/ glute to address pain and restriction. Stair training when ready    PT Home Exercise Plan Eval: ab brace, pelvic tilt, bridge, SKTC; 9/22: LTR and hip excursion;  9/27: SLS 10/11 sidestepping, tandem stance, seated UE/LE raise 10/18 side lying hip abduction, piriformis stretch; 11/3: SLS front of counter    Consulted and Agree with Plan of Care Patient             Patient will benefit from skilled therapeutic intervention in order to improve the following deficits and impairments:  Pain, Improper body mechanics, Increased fascial restricitons, Postural dysfunction, Decreased mobility, Decreased activity tolerance, Decreased range of motion, Decreased strength, Hypomobility, Impaired flexibility  Visit Diagnosis: Low back pain, unspecified back pain laterality, unspecified chronicity, unspecified whether sciatica present  Pain in right hip     Problem List Patient Active Problem List   Diagnosis Date Noted   Colon cancer screening 05/25/2019   Chest pain 05/25/2019   Unilateral primary osteoarthritis, right knee 02/27/2017   Unilateral primary osteoarthritis, left knee 02/27/2017   Breast cancer of lower-inner quadrant of right female breast ([place]) 09/14/2013   STRESS FRACTURE-SITE 11/19/2007   ANKLE SPRAIN 11/19/2007   Ihor Austin, LPTA/CLT; CBIS [phone]  Aldona Lento, PTA 08/10/2021, 7:06 PM  [facility] [place], [place], [address] Phone: [phone]   Fax:  [phone]  Name: Bethany Ray MRN: [id number] Date of Birth: 02/05/1946

## 2021-08-15 ENCOUNTER — Other Ambulatory Visit: Payer: Self-pay

## 2021-08-15 ENCOUNTER — Ambulatory Visit (HOSPITAL_COMMUNITY): Payer: Medicare HMO | Admitting: Physical Therapy

## 2021-08-15 ENCOUNTER — Encounter (HOSPITAL_COMMUNITY): Payer: Self-pay | Admitting: Physical Therapy

## 2021-08-15 DIAGNOSIS — M25551 Pain in right hip: Secondary | ICD-10-CM

## 2021-08-15 DIAGNOSIS — M545 Low back pain, unspecified: Secondary | ICD-10-CM | POA: Diagnosis not present

## 2021-08-15 NOTE — Therapy (Signed)
[facility] [address] [place], [place], [address] Phone: [phone]   Fax:  [phone]  Physical Therapy Treatment  Patient Details  Name: Bethany Ray MRN: [id number] Date of Birth: Feb 19, 1946 Referring Provider (PT): Suella Broad MD   Encounter Date: 08/15/2021   PT End of Session - 08/15/21 1002     Visit Number 10    Number of Visits 12    Date for PT Re-Evaluation 08/22/21    Authorization Type [employer]    Authorization Time Period 4 visits approved 10/18-->08/22/21    Authorization - Visit Number 2    Authorization - Number of Visits 4    Progress Note Due on Visit 12    PT Start Time 0955    PT Stop Time 1033    PT Time Calculation (min) 38 min    Activity Tolerance Patient tolerated treatment well    Behavior During Therapy [facility] for tasks assessed/performed             Past Medical History:  Diagnosis Date   Arthritis    Breast cancer ([place]) 09/14/2013   Cancer ([place]) approx 2006   rt breast/lumpectomy/chemo/rad tx   Diabetes mellitus    High cholesterol    Hypertension    Shingles     Past Surgical History:  Procedure Laterality Date   ABDOMINAL HYSTERECTOMY     BREAST SURGERY     CHOLECYSTECTOMY     IR RADIOLOGY PERIPHERAL GUIDED IV START  11/18/2019   IR US GUIDE VASC ACCESS RIGHT  11/18/2019    There were no vitals filed for this visit.   Subjective Assessment - 08/15/21 1002     Subjective "Pretty good today". Patient notes some pain in hip yesterday. Exercises going well.    Currently in Pain? Yes    Pain Score 4     Pain Location Hip    Pain Orientation Right    Pain Descriptors / Indicators Aching    Pain Type Chronic pain    Pain Onset More than a month ago                               [facility] Adult PT Treatment/Exercise - 08/15/21 0001       Lumbar Exercises: Standing   Heel Raises 20 reps    Heel Raises Limitations Toe raise on slope 20x    Other  Standing Lumbar Exercises 4 inch step up x 10 each no HHA, then x 10 on 6 inch    Other Standing Lumbar Exercises tandem stance on foam 2x 30", 1 set with 10 head turns      Lumbar Exercises: Seated   Sit to Stand 20 reps   2 x 10   Other Seated Lumbar Exercises Romberg stance wiht head turns and nods x10 each, Romberg with eyes closed 3 x 30" on foam    Other Seated Lumbar Exercises standing hip abduction and extension GTB 2 x 10 each                       PT Short Term Goals - 07/25/21 0940       PT SHORT TERM GOAL #1   Title Patient will be independent with initial HEP and self-management strategies to improve functional outcomes    Baseline Reports compliance, demos fairly good return    Time 2    Period Weeks  Status Achieved    Target Date 07/11/21               PT Long Term Goals - 07/25/21 0940       PT LONG TERM GOAL #1   Title Patient will be independent with advanced HEP and self-management strategies to improve functional outcomes    Time 4    Period Weeks    Status On-going      PT LONG TERM GOAL #2   Title Patient will improve FOTO score to predicted value to indicate improvement in functional outcomes    Time 4    Period Weeks    Status On-going      PT LONG TERM GOAL #3   Title Patient will report at least 70% overall improvement in subjective complaint to indicate improvement in ability to perform ADLs.    Baseline Current 60-70%    Time 4    Period Weeks    Status Partially Met      PT LONG TERM GOAL #4   Title Patient will have equal to or > 4+/5 MMT throughout BLE to improve ability to perform functional mobility, stair ambulation and ADLs.    Baseline See MMT    Time 4    Period Weeks    Status Partially Met                   Plan - 08/15/21 1553     Clinical Impression Statement Patient showing good progress. Able to progress step height to 6 inch step today. No increased pain during session. Patient tolerated  balance progressions well and showing good overall static balance. Was somewhat challenged with head nods on foam but able to maintain BOS. Patient educated on proper form and function of all added activity. Patient will continue to benefit from skilled therapy services to reduce deficits and improve functional ability.    Examination-Activity Limitations Lift;Stand;Transfers;Locomotion Level;Bend;Squat;Stairs    Examination-Participation Restrictions Yard Work;Cleaning;Community Activity;Laundry;Shop    Stability/Clinical Decision Making Stable/Uncomplicated    Rehab Potential Good    PT Frequency 1x / week    PT Duration 4 weeks    PT Treatment/Interventions ADLs/Self Care Home Management;Biofeedback;Cryotherapy;Electrical Stimulation;Contrast Bath;Therapeutic exercise;Patient/family education;Orthotic Fit/Training;Compression bandaging;Neuromuscular re-education;Balance training;DME Instruction;Iontophoresis 20m/ml Dexamethasone;Gait training;Stair training;Moist Heat;Functional mobility training;Traction;Ultrasound;Parrafin;Fluidtherapy;Therapeutic activities;Manual lymph drainage;Scar mobilization;Passive range of motion;Vestibular;Dry needling;Energy conservation;Splinting;Taping;Vasopneumatic Device;Joint Manipulations;Spinal Manipulations;Visual/perceptual remediation/compensation;Manual techniques    PT Next Visit Plan Continue to progress hip and core strength as tolerated. Functional strength and balance, manual to RT hip/ glute to address pain and restriction. Stair training    PT Home Exercise Plan Eval: ab brace, pelvic tilt, bridge, SKTC; 9/22: LTR and hip excursion; 9/27: SLS 10/11 sidestepping, tandem stance, seated UE/LE raise 10/18 side lying hip abduction, piriformis stretch; 11/3: SLS front of counter    Consulted and Agree with Plan of Care Patient             Patient will benefit from skilled therapeutic intervention in order to improve the following deficits and impairments:   Pain, Improper body mechanics, Increased fascial restricitons, Postural dysfunction, Decreased mobility, Decreased activity tolerance, Decreased range of motion, Decreased strength, Hypomobility, Impaired flexibility  Visit Diagnosis: Low back pain, unspecified back pain laterality, unspecified chronicity, unspecified whether sciatica present  Pain in right hip     Problem List Patient Active Problem List   Diagnosis Date Noted   Colon cancer screening 05/25/2019   Chest pain 05/25/2019   Unilateral primary osteoarthritis, right knee 02/27/2017  Unilateral primary osteoarthritis, left knee 02/27/2017   Breast cancer of lower-inner quadrant of right female breast ([place]) 09/14/2013   STRESS FRACTURE-SITE 11/19/2007   ANKLE SPRAIN 11/19/2007   3:56 PM, 08/15/21 Josue Hector PT DPT  Physical Therapist with [facility]  [phone] [place] [address] [place], [place], [address] Phone: [phone]   Fax:  [phone]  Name: Bethany Ray [place] MRN: [id number] Date of Birth: 19-Nov-1945

## 2021-08-16 ENCOUNTER — Ambulatory Visit (HOSPITAL_COMMUNITY): Payer: Medicare HMO | Admitting: Physical Therapy

## 2021-08-16 DIAGNOSIS — M545 Low back pain, unspecified: Secondary | ICD-10-CM

## 2021-08-16 DIAGNOSIS — M25551 Pain in right hip: Secondary | ICD-10-CM

## 2021-08-16 NOTE — Therapy (Signed)
[place], [place], [address] Phone: [phone]   Fax:  [phone]  Physical Therapy Treatment  Patient Details  Name: Bethany Ray MRN: [id number] Date of Birth: 01-25-1946 Referring Provider (PT): Suella Broad MD   Encounter Date: 08/16/2021   PT End of Session - 08/16/21 1004     Visit Number 11    Number of Visits 12    Date for PT Re-Evaluation 08/22/21    Authorization Type [employer]    Authorization Time Period 4 visits approved 10/18-->08/22/21    Authorization - Visit Number 3    Authorization - Number of Visits 4    Progress Note Due on Visit 12    PT Start Time 1003    PT Stop Time 2671    PT Time Calculation (min) 38 min    Activity Tolerance Patient tolerated treatment well    Behavior During Therapy [facility] for tasks assessed/performed             Past Medical History:  Diagnosis Date   Arthritis    Breast cancer ([place]) 09/14/2013   Cancer ([place]) approx 2006   rt breast/lumpectomy/chemo/rad tx   Diabetes mellitus    High cholesterol    Hypertension    Shingles     Past Surgical History:  Procedure Laterality Date   ABDOMINAL HYSTERECTOMY     BREAST SURGERY     CHOLECYSTECTOMY     IR RADIOLOGY PERIPHERAL GUIDED IV START  11/18/2019   IR US GUIDE VASC ACCESS RIGHT  11/18/2019    There were no vitals filed for this visit.   Subjective Assessment - 08/16/21 1007     Subjective States she has been having  right hip pain and is 7/10.    Currently in Pain? Yes    Pain Score 7     Pain Location Back    Pain Orientation Right    Pain Descriptors / Indicators Aching;Tightness    Pain Onset More than a month ago                [facility] PT Assessment - 08/16/21 0001       Assessment   Medical Diagnosis Lumbar degeneration    Referring Provider (PT) Suella Broad MD                           [facility] Adult PT Treatment/Exercise - 08/16/21 0001        Lumbar Exercises: Standing   Other Standing Lumbar Exercises lateral stepping in bars with 2 hand support and 2# ankle weights x10 laps, narrow base of support on black foam wiht diagnol head turns 2x10 B    Other Standing Lumbar Exercises hip abd and then ext with 2# ankle weights 2x10 B wit UE assist; tandem on black foam 3x30" holds B, narrow BOS wiht head turns 2x10 B on black foam      Lumbar Exercises: Seated   Sit to Stand 15 reps   2 sets                      PT Short Term Goals - 07/25/21 0940       PT SHORT TERM GOAL #1   Title Patient will be independent with initial HEP and self-management strategies to improve functional outcomes    Baseline Reports compliance, demos fairly good return    Time 2    Period  Weeks    Status Achieved    Target Date 07/11/21               PT Long Term Goals - 08/16/21 1016       PT LONG TERM GOAL #1   Title Patient will be independent with advanced HEP and self-management strategies to improve functional outcomes    Time 4    Period Weeks    Status Achieved      PT LONG TERM GOAL #2   Title Patient will improve FOTO score to predicted value to indicate improvement in functional outcomes    Time 4    Period Weeks    Status On-going      PT LONG TERM GOAL #3   Title Patient will report at least 70% overall improvement in subjective complaint to indicate improvement in ability to perform ADLs.    Baseline Current 50-70%- good days and bad days    Time 4    Period Weeks    Status Partially Met      PT LONG TERM GOAL #4   Title Patient will have equal to or > 4+/5 MMT throughout BLE to improve ability to perform functional mobility, stair ambulation and ADLs.    Baseline See MMT    Time 4    Period Weeks    Status Partially Met                   Plan - 08/16/21 1008     Clinical Impression Statement Overall patient is doing well and able to demonstrate improved strength with functional tasks. Hip  pain comes and goes as she has good and bad days but feels good with next session being her last session. Patient reported fatigue in hips and legs end of session but no increase in baseline pain. Anticipate next session to be last session pending patient presentation will continue with current POC.    Examination-Activity Limitations Lift;Stand;Transfers;Locomotion Level;Bend;Squat;Stairs    Examination-Participation Restrictions Yard Work;Cleaning;Community Activity;Laundry;Shop    Stability/Clinical Decision Making Stable/Uncomplicated    Rehab Potential Good    PT Frequency 1x / week    PT Duration 4 weeks    PT Treatment/Interventions ADLs/Self Care Home Management;Biofeedback;Cryotherapy;Electrical Stimulation;Contrast Bath;Therapeutic exercise;Patient/family education;Orthotic Fit/Training;Compression bandaging;Neuromuscular re-education;Balance training;DME Instruction;Iontophoresis 42m/ml Dexamethasone;Gait training;Stair training;Moist Heat;Functional mobility training;Traction;Ultrasound;Parrafin;Fluidtherapy;Therapeutic activities;Manual lymph drainage;Scar mobilization;Passive range of motion;Vestibular;Dry needling;Energy conservation;Splinting;Taping;Vasopneumatic Device;Joint Manipulations;Spinal Manipulations;Visual/perceptual remediation/compensation;Manual techniques    PT Next Visit Plan anticipate DC - continue balance and strengthening exercises    PT Home Exercise Plan Eval: ab brace, pelvic tilt, bridge, SKTC; 9/22: LTR and hip excursion; 9/27: SLS 10/11 sidestepping, tandem stance, seated UE/LE raise 10/18 side lying hip abduction, piriformis stretch; 11/3: SLS front of counter    Consulted and Agree with Plan of Care Patient             Patient will benefit from skilled therapeutic intervention in order to improve the following deficits and impairments:  Pain, Improper body mechanics, Increased fascial restricitons, Postural dysfunction, Decreased mobility, Decreased  activity tolerance, Decreased range of motion, Decreased strength, Hypomobility, Impaired flexibility  Visit Diagnosis: Low back pain, unspecified back pain laterality, unspecified chronicity, unspecified whether sciatica present  Pain in right hip     Problem List Patient Active Problem List   Diagnosis Date Noted   Colon cancer screening 05/25/2019   Chest pain 05/25/2019   Unilateral primary osteoarthritis, right knee 02/27/2017   Unilateral primary osteoarthritis, left knee 02/27/2017   Breast cancer of  lower-inner quadrant of right female breast ([place]) 09/14/2013   STRESS FRACTURE-SITE 11/19/2007   ANKLE SPRAIN 11/19/2007   10:42 AM, 08/16/21 Jerene Pitch, DPT Physical Therapy with [facility]  [phone] office   [place] [address] [place], [place], [address] Phone: [phone]   Fax:  [phone]  Name: TRINA ASCH MRN: [id number] Date of Birth: [birth year]

## 2021-08-17 DIAGNOSIS — I10 Essential (primary) hypertension: Secondary | ICD-10-CM | POA: Diagnosis not present

## 2021-08-17 DIAGNOSIS — E785 Hyperlipidemia, unspecified: Secondary | ICD-10-CM | POA: Diagnosis not present

## 2021-08-17 DIAGNOSIS — E119 Type 2 diabetes mellitus without complications: Secondary | ICD-10-CM | POA: Diagnosis not present

## 2021-08-17 DIAGNOSIS — R7309 Other abnormal glucose: Secondary | ICD-10-CM | POA: Diagnosis not present

## 2021-08-22 ENCOUNTER — Encounter (HOSPITAL_COMMUNITY): Payer: Self-pay

## 2021-08-22 ENCOUNTER — Other Ambulatory Visit: Payer: Self-pay

## 2021-08-22 ENCOUNTER — Ambulatory Visit (HOSPITAL_COMMUNITY): Payer: Medicare HMO

## 2021-08-22 DIAGNOSIS — M545 Low back pain, unspecified: Secondary | ICD-10-CM | POA: Diagnosis not present

## 2021-08-22 DIAGNOSIS — M25551 Pain in right hip: Secondary | ICD-10-CM | POA: Diagnosis not present

## 2021-08-22 NOTE — Therapy (Addendum)
[facility] [address] [place], [place], [address] Phone: [phone]   Fax:  [phone]  Physical Therapy Treatment  Patient Details  Name: Bethany Ray MRN: [id number] Date of Birth: 12/01/1945 Referring Provider (PT): Suella Broad MD  PHYSICAL THERAPY DISCHARGE SUMMARY  Visits from Start of Care: 12  Current functional level related to goals / functional outcomes: See below   Remaining deficits: See below   Education / Equipment: See assessment   Patient agrees to discharge. Patient goals were partially met. Patient is being discharged due to being pleased with the current functional level.  Encounter Date: 08/22/2021   PT End of Session - 08/22/21 1035     Visit Number 12    Number of Visits 12    Date for PT Re-Evaluation 08/22/21    Authorization Type [employer]    Authorization Time Period 4 visits approved 10/18-->08/22/21    Authorization - Visit Number 4    Authorization - Number of Visits 4    Progress Note Due on Visit 12    PT Start Time 1008    PT Stop Time 0254    PT Time Calculation (min) 39 min    Activity Tolerance Patient tolerated treatment well    Behavior During Therapy [facility] for tasks assessed/performed             Past Medical History:  Diagnosis Date   Arthritis    Breast cancer ([place]) 09/14/2013   Cancer ([place]) approx 2006   rt breast/lumpectomy/chemo/rad tx   Diabetes mellitus    High cholesterol    Hypertension    Shingles     Past Surgical History:  Procedure Laterality Date   ABDOMINAL HYSTERECTOMY     BREAST SURGERY     CHOLECYSTECTOMY     IR RADIOLOGY PERIPHERAL GUIDED IV START  11/18/2019   IR US GUIDE VASC ACCESS RIGHT  11/18/2019    There were no vitals filed for this visit.   Subjective Assessment - 08/22/21 1011     Subjective Pt stated she has increased right hip pain feels due to cold weather, pain scale 7/10 today.    Patient Stated Goals Reduce pain     Currently in Pain? Yes    Pain Score 7     Pain Location Hip    Pain Orientation Right    Pain Descriptors / Indicators Aching;Sore;Tender    Pain Type Chronic pain    Pain Onset More than a month ago    Pain Frequency Intermittent    Aggravating Factors  standing    Pain Relieving Factors sitting in the recliner    Effect of Pain on Daily Activities limits                [facility] PT Assessment - 08/22/21 0001       Assessment   Medical Diagnosis Lumbar degeneration    Referring Provider (PT) Suella Broad MD    Next MD Visit following therapy    Prior Therapy Yes      Observation/Other Assessments   Focus on Therapeutic Outcomes (FOTO)  53.75% functional   was 40% functional     AROM   Lumbar Flexion WFL   hip soreness   Lumbar Extension 10% limited   was 25%   Lumbar - Right Side Bend 10% limited   was 25%   Lumbar - Left Side Bend 10% limited   25%     Strength   Right Hip  Flexion 4+/5   soreness was 4+   Right Hip Extension 4-/5   was 4/5 hip soreness   Right Hip ABduction 4/5   was 4/5   Left Hip Flexion 4/5   4/5 soreness was 4+/5   Left Hip Extension 4/5   was 4/5   Left Hip ABduction 4/5   soreness4/5   Right Knee Flexion 4/5    Right Knee Extension 4+/5    Left Knee Flexion 4-/5    Left Knee Extension 4+/5    Right Ankle Dorsiflexion 5/5    Left Ankle Dorsiflexion 5/5      6 minute walk test results    Aerobic Endurance Distance Walked 420    Endurance additional comments 2MWT, no AD                           [facility] Adult PT Treatment/Exercise - 08/22/21 0001       Lumbar Exercises: Standing   Other Standing Lumbar Exercises SLS Lt 18", Rt 26", vector stance 3x 5"    Other Standing Lumbar Exercises tandem stance 2x 30"                       PT Short Term Goals - 08/22/21 1012       PT SHORT TERM GOAL #1   Title Patient will be independent with initial HEP and self-management strategies to improve functional  outcomes    Baseline Reports compliance, demos fairly good return    Status Achieved      PT SHORT TERM GOAL #2   Title Patient to show improved gait mechanics in order to improve efficiency of gait and reduce pain                PT Long Term Goals - 08/22/21 1012       PT LONG TERM GOAL #1   Title Patient will be independent with advanced HEP and self-management strategies to improve functional outcomes    Status Achieved      PT LONG TERM GOAL #2   Title Patient will improve FOTO score to predicted value to indicate improvement in functional outcomes    Baseline 11/15:  53.75% vs 40% initiaiolly    Status Achieved      PT LONG TERM GOAL #3   Title Patient will report at least 70% overall improvement in subjective complaint to indicate improvement in ability to perform ADLs.    Baseline 08/22/21: 50-60% improvement depends upon the day; was 50-70%- good days and bad days    Status Partially Met      PT LONG TERM GOAL #4   Title Patient will have equal to or > 4+/5 MMT throughout BLE to improve ability to perform functional mobility, stair ambulation and ADLs.    Baseline See MMT    Status Partially Met                   Plan - 08/22/21 1825     Clinical Impression Statement This session reviewed goals with the following findings:  Reports compliance wiht HEP.  Feels she has improved 50-60% (was 70% last assessed).  Presents with improved static balance and has been compliant with balance exercises at home.  MMT showed the same strength or some decreased, may be related to different position initially tested or reports of increased soreness with rainy/cold weather.  Advanced HEP this session with additional vector stance for  hip strengthening and SLS, encouraged to complete by counter at home.    Examination-Activity Limitations Lift;Stand;Transfers;Locomotion Level;Bend;Squat;Stairs    Examination-Participation Restrictions Yard Work;Cleaning;Community  Activity;Laundry;Shop    Stability/Clinical Decision Making Stable/Uncomplicated    Clinical Decision Making Low    Rehab Potential Good    PT Frequency 1x / week    PT Duration 4 weeks    PT Treatment/Interventions ADLs/Self Care Home Management;Biofeedback;Cryotherapy;Electrical Stimulation;Contrast Bath;Therapeutic exercise;Patient/family education;Orthotic Fit/Training;Compression bandaging;Neuromuscular re-education;Balance training;DME Instruction;Iontophoresis 87m/ml Dexamethasone;Gait training;Stair training;Moist Heat;Functional mobility training;Traction;Ultrasound;Parrafin;Fluidtherapy;Therapeutic activities;Manual lymph drainage;Scar mobilization;Passive range of motion;Vestibular;Dry needling;Energy conservation;Splinting;Taping;Vasopneumatic Device;Joint Manipulations;Spinal Manipulations;Visual/perceptual remediation/compensation;Manual techniques    PT Next Visit Plan DC to HEP    PT Home Exercise Plan Eval: ab brace, pelvic tilt, bridge, SKTC; 9/22: LTR and hip excursion; 9/27: SLS 10/11 sidestepping, tandem stance, seated UE/LE raise 10/18 side lying hip abduction, piriformis stretch; 11/3: SLS front of counter; 08/22/21:    Consulted and Agree with Plan of Care Patient             Patient will benefit from skilled therapeutic intervention in order to improve the following deficits and impairments:  Pain, Improper body mechanics, Increased fascial restricitons, Postural dysfunction, Decreased mobility, Decreased activity tolerance, Decreased range of motion, Decreased strength, Hypomobility, Impaired flexibility  Visit Diagnosis: Low back pain, unspecified back pain laterality, unspecified chronicity, unspecified whether sciatica present  Pain in right hip     Problem List Patient Active Problem List   Diagnosis Date Noted   Colon cancer screening 05/25/2019   Chest pain 05/25/2019   Unilateral primary osteoarthritis, right knee 02/27/2017   Unilateral primary  osteoarthritis, left knee 02/27/2017   Breast cancer of lower-inner quadrant of right female breast ([place] 09/14/2013   STRESS FRACTURE-SITE 11/19/2007   ANKLE SPRAIN 11/19/2007   CIhor Austin LPTA/CLT; CBIS [phone] CAldona Lento PTA 08/22/2021, 6:40 PM  3:15 PM, 08/29/21 CJosue HectorPT DPT  [facility] [phone] [address]Phone: [phone]  Fax:  [phone] Name: Bethany SOUTERMRN: [id number] of Birth: 211-22-1947

## 2021-08-22 NOTE — Patient Instructions (Signed)
Vector Stance               Standing tall front of counter.  Begin standing with hip flexed then out to side then behind holding all positions for 5 seconds.   Complete 3 sets each LE holding for 5 seconds each.  Copyright  VHI. All rights reserved.

## 2021-08-23 ENCOUNTER — Encounter (HOSPITAL_COMMUNITY): Payer: Medicare HMO | Admitting: Physical Therapy

## 2021-08-24 ENCOUNTER — Encounter (INDEPENDENT_AMBULATORY_CARE_PROVIDER_SITE_OTHER): Payer: Self-pay | Admitting: *Deleted

## 2021-09-06 DIAGNOSIS — I10 Essential (primary) hypertension: Secondary | ICD-10-CM | POA: Diagnosis not present

## 2021-09-06 DIAGNOSIS — E785 Hyperlipidemia, unspecified: Secondary | ICD-10-CM | POA: Diagnosis not present

## 2021-09-06 DIAGNOSIS — E119 Type 2 diabetes mellitus without complications: Secondary | ICD-10-CM | POA: Diagnosis not present

## 2021-10-06 DIAGNOSIS — I10 Essential (primary) hypertension: Secondary | ICD-10-CM | POA: Diagnosis not present

## 2021-10-06 DIAGNOSIS — E785 Hyperlipidemia, unspecified: Secondary | ICD-10-CM | POA: Diagnosis not present

## 2021-10-06 DIAGNOSIS — E119 Type 2 diabetes mellitus without complications: Secondary | ICD-10-CM | POA: Diagnosis not present

## 2021-10-11 ENCOUNTER — Encounter (INDEPENDENT_AMBULATORY_CARE_PROVIDER_SITE_OTHER): Payer: Self-pay | Admitting: *Deleted

## 2021-10-31 DIAGNOSIS — E785 Hyperlipidemia, unspecified: Secondary | ICD-10-CM | POA: Diagnosis not present

## 2021-10-31 DIAGNOSIS — M170 Bilateral primary osteoarthritis of knee: Secondary | ICD-10-CM | POA: Diagnosis not present

## 2021-10-31 DIAGNOSIS — Z79899 Other long term (current) drug therapy: Secondary | ICD-10-CM | POA: Diagnosis not present

## 2021-10-31 DIAGNOSIS — Z13820 Encounter for screening for osteoporosis: Secondary | ICD-10-CM | POA: Diagnosis not present

## 2021-10-31 DIAGNOSIS — M79643 Pain in unspecified hand: Secondary | ICD-10-CM | POA: Diagnosis not present

## 2021-10-31 DIAGNOSIS — M25561 Pain in right knee: Secondary | ICD-10-CM | POA: Diagnosis not present

## 2021-10-31 DIAGNOSIS — M0579 Rheumatoid arthritis with rheumatoid factor of multiple sites without organ or systems involvement: Secondary | ICD-10-CM | POA: Diagnosis not present

## 2021-10-31 DIAGNOSIS — E1169 Type 2 diabetes mellitus with other specified complication: Secondary | ICD-10-CM | POA: Diagnosis not present

## 2021-10-31 DIAGNOSIS — M549 Dorsalgia, unspecified: Secondary | ICD-10-CM | POA: Diagnosis not present

## 2021-11-05 DIAGNOSIS — E785 Hyperlipidemia, unspecified: Secondary | ICD-10-CM | POA: Diagnosis not present

## 2021-11-05 DIAGNOSIS — E119 Type 2 diabetes mellitus without complications: Secondary | ICD-10-CM | POA: Diagnosis not present

## 2021-11-05 DIAGNOSIS — I10 Essential (primary) hypertension: Secondary | ICD-10-CM | POA: Diagnosis not present

## 2021-11-14 DIAGNOSIS — I10 Essential (primary) hypertension: Secondary | ICD-10-CM | POA: Diagnosis not present

## 2021-11-14 DIAGNOSIS — Z79899 Other long term (current) drug therapy: Secondary | ICD-10-CM | POA: Diagnosis not present

## 2021-11-14 DIAGNOSIS — M069 Rheumatoid arthritis, unspecified: Secondary | ICD-10-CM | POA: Diagnosis not present

## 2021-11-14 DIAGNOSIS — E1129 Type 2 diabetes mellitus with other diabetic kidney complication: Secondary | ICD-10-CM | POA: Diagnosis not present

## 2021-11-14 DIAGNOSIS — E785 Hyperlipidemia, unspecified: Secondary | ICD-10-CM | POA: Diagnosis not present

## 2021-11-27 DIAGNOSIS — M069 Rheumatoid arthritis, unspecified: Secondary | ICD-10-CM | POA: Diagnosis not present

## 2021-11-27 DIAGNOSIS — I10 Essential (primary) hypertension: Secondary | ICD-10-CM | POA: Diagnosis not present

## 2021-11-27 DIAGNOSIS — E1122 Type 2 diabetes mellitus with diabetic chronic kidney disease: Secondary | ICD-10-CM | POA: Diagnosis not present

## 2021-11-27 DIAGNOSIS — R7309 Other abnormal glucose: Secondary | ICD-10-CM | POA: Diagnosis not present

## 2021-11-30 ENCOUNTER — Ambulatory Visit (INDEPENDENT_AMBULATORY_CARE_PROVIDER_SITE_OTHER): Payer: Medicare HMO | Admitting: Gastroenterology

## 2021-11-30 ENCOUNTER — Encounter (INDEPENDENT_AMBULATORY_CARE_PROVIDER_SITE_OTHER): Payer: Self-pay | Admitting: Gastroenterology

## 2021-11-30 ENCOUNTER — Other Ambulatory Visit: Payer: Self-pay

## 2021-11-30 VITALS — BP 135/58 | HR 61 | Temp 99.5°F | Ht 60.0 in | Wt 176.8 lb

## 2021-11-30 DIAGNOSIS — Z1211 Encounter for screening for malignant neoplasm of colon: Secondary | ICD-10-CM

## 2021-11-30 DIAGNOSIS — R195 Other fecal abnormalities: Secondary | ICD-10-CM | POA: Diagnosis not present

## 2021-11-30 DIAGNOSIS — Z800 Family history of malignant neoplasm of digestive organs: Secondary | ICD-10-CM

## 2021-11-30 NOTE — Patient Instructions (Signed)
As we discussed, I think it would be a good idea to update your colonoscopy given time frame of last one and possibility that your sister had colon cancer, you can speak with her to find out exactly what she did have and let me know what you decide about proceeding.  As for your looser stools, please keep a food journal for the next 1-2 weeks to see if you can find any correlation between your looser stools and things you are eating. This is likely related to some irritable bowel syndrome. You can use imodium as needed, do not exceed 4 tablets in 24 hours.  I have also provided the low FODMAP diet, this can be helpful for people who have bowel issues as it helps guide you on foods that are less likely to cause looser stools/constipation.

## 2021-11-30 NOTE — Progress Notes (Signed)
Referring Provider: Asencion Noble, MD Primary Care Physician:  Asencion Noble, MD Primary GI Physician: Rehman  Chief Complaint  Patient presents with   Diarrhea    Pt referred for diarrhea and wanted to set up colonoscopy.    HPI:   Bethany Ray is a 76 y.o. female with past medical history of breast cancer s/p R breast lumpectomy, 4 cycles of TEC and radiation, then arimidex x5 yrs, RA, DM, HTN, high cholesterol, OA  Patient presenting today as new patient for diarrhea and to set up colonoscopy.  Last seen in August 2020 with plans to update colonoscopy thereafter, however, she was lost to follow up for this, reportedly colonoscopy was 10+ years ago at that time (2008?).   Patient states that she has had some issues with diarrhea. She states that she is not a coffee drinker, however, if she drinks coffee, she will have a good BM. She reports having a BM 1-2x/day. She states that she has some occasional diarrhea with fecal urgency, she has had one accident but otherwise, usually makes it to the restroom, no instances of nocturnal fecal incontinence. She may have diarrhea 2-3x/week, usually will have maybe 3 episodes when this occurs. Stools are usually more loose, than watery. she denies rectal bleeding or melena. Occasionally she has some lower, mid abdominal pain that improves after BM. Very infrequent episodes of constipation. Denies nausea or vomiting. States that appetite is stable, she has had no weight loss. Denies early satiety or post prandial abdominal pain. She does endorse some episodes of acid reflux, she states maybe 3x/ week but usually she can drink water which will resolve this. She has taken tums in the past but usually does not require these as symptoms are so mild. She was eating later at night before bed, but she is trying to avoid doing that which has also helped with her reflux symptoms.   NSAID CXK:GYJE uses tylenol Social hx: no etoh or tobacco Fam hx: sister possibly  had CRC? Maybe early 87s.  Last Colonoscopy:2008? Last Endoscopy:no  Past Medical History:  Diagnosis Date   Arthritis    Breast cancer ([place]) 09/14/2013   Cancer ([place]) approx 2006   rt breast/lumpectomy/chemo/rad tx   Diabetes mellitus    High cholesterol    Hypertension    Shingles     Past Surgical History:  Procedure Laterality Date   ABDOMINAL HYSTERECTOMY     BREAST SURGERY     CHOLECYSTECTOMY     IR RADIOLOGY PERIPHERAL GUIDED IV START  11/18/2019   IR US GUIDE VASC ACCESS RIGHT  11/18/2019    Current Outpatient Medications  Medication Sig Dispense Refill   acetaminophen (TYLENOL) 500 MG tablet Take 500 mg by mouth every 6 (six) hours as needed. Pain     amLODipine (NORVASC) 10 MG tablet Take 10 mg by mouth daily.     aspirin EC 81 MG tablet Take 81 mg by mouth daily.     B Complex-C (B-COMPLEX WITH VITAMIN C) tablet Take 1 tablet by mouth daily.     diclofenac Sodium (VOLTAREN) 1 % GEL Apply 4 g topically 4 (four) times daily. 563 g 0   folic acid (FOLVITE) 1 MG tablet Take 1 mg by mouth daily.     glimepiride (AMARYL) 1 MG tablet Take 1 mg by mouth daily with breakfast.      hydrochlorothiazide (HYDRODIURIL) 25 MG tablet Take 25 mg by mouth daily.     metFORMIN (GLUCOPHAGE-XR) 500 MG 24  hr tablet Take 500 mg by mouth 2 (two) times daily.      methotrexate (RHEUMATREX) 2.5 MG tablet Take by mouth 2 (two) times a week. Takes 4 tablets on Thursdays and Fridays     Multiple Vitamin (MULITIVITAMIN WITH MINERALS) TABS Take 1 tablet by mouth daily.     pravastatin (PRAVACHOL) 20 MG tablet Take 20 mg by mouth every evening.      spironolactone (ALDACTONE) 25 MG tablet Take 25 mg by mouth daily.     No current facility-administered medications for this visit.    Allergies as of 11/30/2021 - Review Complete 11/30/2021  Allergen Reaction Noted   Penicillins Other (See Comments)     History reviewed. No pertinent family history.  Social History   Socioeconomic History    Marital status: Married    Spouse name: Not on file   Number of children: Not on file   Years of education: Not on file   Highest education level: Not on file  Occupational History   Not on file  Tobacco Use   Smoking status: Never   Smokeless tobacco: Never  Vaping Use   Vaping Use: Never used  Substance and Sexual Activity   Alcohol use: No   Drug use: No   Sexual activity: Not Currently  Other Topics Concern   Not on file  Social History Narrative   Not on file   Social Determinants of Health   Financial Resource Strain: Not on file  Food Insecurity: Not on file  Transportation Needs: Not on file  Physical Activity: Not on file  Stress: Not on file  Social Connections: Not on file   Review of systems General: negative for malaise, night sweats, fever, chills, weight loss Neck: Negative for lumps, goiter, pain and significant neck swelling Resp: Negative for cough, wheezing, dyspnea at rest CV: Negative for chest pain, leg swelling, palpitations, orthopnea GI: denies melena, hematochezia, nausea, vomiting, constipation, dysphagia, odyonophagia, early satiety or unintentional weight loss. +loose stools MSK: Negative for joint pain or swelling, back pain, and muscle pain. Derm: Negative for itching or rash Psych: Denies depression, anxiety, memory loss, confusion. No homicidal or suicidal ideation.  Heme: Negative for prolonged bleeding, bruising easily, and swollen nodes. Endocrine: Negative for cold or heat intolerance, polyuria, polydipsia and goiter. Neuro: negative for tremor, gait imbalance, syncope and seizures. The remainder of the review of systems is noncontributory.  Physical Exam: BP (!) 135/58 (BP Location: Left Arm, Patient Position: Sitting, Cuff Size: Large)    Pulse 61    Temp 99.5 F (37.5 C) (Oral)    Ht 5' (1.524 m)    Wt 176 lb 12.8 oz (80.2 kg)    BMI 34.53 kg/m  General:   Alert and oriented. No distress noted. Pleasant and cooperative.   Head:  Normocephalic and atraumatic. Eyes:  Conjuctiva clear without scleral icterus. Mouth:  Oral mucosa pink and moist. Good dentition. No lesions. Heart: Normal rate and rhythm, s1 and s2 heart sounds present.  Lungs: Clear lung sounds in all lobes. Respirations equal and unlabored. Abdomen:  +BS, soft, non-tender and non-distended. No rebound or guarding. No HSM or masses noted. Derm: No palmar erythema or jaundice Msk:  Symmetrical without gross deformities. Normal posture. Extremities:  Without edema. Neurologic:  Alert and  oriented x4 Psych:  Alert and cooperative. Normal mood and affect.  Invalid input(s): 6 MONTHS   ASSESSMENT: Bethany Ray is a 76 y.o. female presenting today for loose stools and to schedule  colon cancer screening via colonoscopy.  Patient reports intermittent looser stools that occur a few times per week with some fecal urgency at times, this has been occurring for the past year or so. She uses imodium as needed with good results. Denies any watery stools, she has no rectal bleeding or melena. Appetite and weight are stable. We discussed that looser stools are likely related to IBS and/or foods she is eating. I have recommended she keep a food journal for 1-2 weeks and try low FODMAP diet. She has good results with imodium as needed, she can continue this. She is without alarm symptoms.   Last colonoscopy appears to be around 2008, however, I cannot review records for this. Patient tells me that she thinks her sister has a hx of colon cancer in her early 59s but she is not certain about this. I did discuss recommendations to update her colonoscopy with indications of this for screening purposes, especially since there is question of 1st degree relative with possible hx of CRC. Indications, risks and benefits of procedure discussed in detail with patient. Patient verbalized understanding, she would like to think about the procedure and talk with her sister about  exact diagnosis. She will let me know what she decides in regards to proceeding with colonoscopy.  All questions were answered, patient verbalized understanding and is in agreement with plan as outline above.   PLAN: Patient to let me know about colonoscopy 2.  Continue imodium PRN, do not exceed 4 tablets/24 hrs 3. Try low FODMAP diet 4. Patient to make me aware of new or worsening GI symptoms   Follow Up: TBD  Bird Swetz L. Alver Sorrow, MSN, APRN, AGNP-C Adult-Gerontology Nurse Practitioner [facility]

## 2021-12-05 DIAGNOSIS — E119 Type 2 diabetes mellitus without complications: Secondary | ICD-10-CM | POA: Diagnosis not present

## 2021-12-05 DIAGNOSIS — E785 Hyperlipidemia, unspecified: Secondary | ICD-10-CM | POA: Diagnosis not present

## 2021-12-05 DIAGNOSIS — I10 Essential (primary) hypertension: Secondary | ICD-10-CM | POA: Diagnosis not present

## 2022-01-01 DIAGNOSIS — B0052 Herpesviral keratitis: Secondary | ICD-10-CM | POA: Diagnosis not present

## 2022-01-05 DIAGNOSIS — E119 Type 2 diabetes mellitus without complications: Secondary | ICD-10-CM | POA: Diagnosis not present

## 2022-01-05 DIAGNOSIS — I10 Essential (primary) hypertension: Secondary | ICD-10-CM | POA: Diagnosis not present

## 2022-01-05 DIAGNOSIS — E785 Hyperlipidemia, unspecified: Secondary | ICD-10-CM | POA: Diagnosis not present

## 2022-01-24 ENCOUNTER — Encounter (INDEPENDENT_AMBULATORY_CARE_PROVIDER_SITE_OTHER): Payer: Self-pay

## 2022-01-24 ENCOUNTER — Telehealth (INDEPENDENT_AMBULATORY_CARE_PROVIDER_SITE_OTHER): Payer: Self-pay

## 2022-01-24 NOTE — Telephone Encounter (Signed)
I have left 2 voicemails for a returned call and I have not received one back I mailed her a letter to reach out to [place] ?

## 2022-01-24 NOTE — Telephone Encounter (Signed)
Thanks for the update, hopefully she will get back to [place] ?

## 2022-01-25 ENCOUNTER — Telehealth (INDEPENDENT_AMBULATORY_CARE_PROVIDER_SITE_OTHER): Payer: Self-pay

## 2022-01-25 ENCOUNTER — Other Ambulatory Visit (INDEPENDENT_AMBULATORY_CARE_PROVIDER_SITE_OTHER): Payer: Self-pay

## 2022-01-25 DIAGNOSIS — Z800 Family history of malignant neoplasm of digestive organs: Secondary | ICD-10-CM

## 2022-01-25 DIAGNOSIS — Z1211 Encounter for screening for malignant neoplasm of colon: Secondary | ICD-10-CM

## 2022-01-25 MED ORDER — PEG 3350-KCL-NA BICARB-NACL 420 G PO SOLR: 4000.0000 mL | ORAL | 0 refills | Status: DC

## 2022-01-25 NOTE — Telephone Encounter (Signed)
Thanks

## 2022-01-25 NOTE — Telephone Encounter (Signed)
Rilynne Lonsway Ann Sharisse Rantz, CMA  ?

## 2022-01-26 ENCOUNTER — Encounter (INDEPENDENT_AMBULATORY_CARE_PROVIDER_SITE_OTHER): Payer: Self-pay

## 2022-01-30 DIAGNOSIS — E785 Hyperlipidemia, unspecified: Secondary | ICD-10-CM | POA: Diagnosis not present

## 2022-01-30 DIAGNOSIS — Z79899 Other long term (current) drug therapy: Secondary | ICD-10-CM | POA: Diagnosis not present

## 2022-01-30 DIAGNOSIS — M79643 Pain in unspecified hand: Secondary | ICD-10-CM | POA: Diagnosis not present

## 2022-01-30 DIAGNOSIS — M0579 Rheumatoid arthritis with rheumatoid factor of multiple sites without organ or systems involvement: Secondary | ICD-10-CM | POA: Diagnosis not present

## 2022-01-30 DIAGNOSIS — M549 Dorsalgia, unspecified: Secondary | ICD-10-CM | POA: Diagnosis not present

## 2022-01-30 DIAGNOSIS — M170 Bilateral primary osteoarthritis of knee: Secondary | ICD-10-CM | POA: Diagnosis not present

## 2022-01-30 DIAGNOSIS — Z13820 Encounter for screening for osteoporosis: Secondary | ICD-10-CM | POA: Diagnosis not present

## 2022-01-30 DIAGNOSIS — E1169 Type 2 diabetes mellitus with other specified complication: Secondary | ICD-10-CM | POA: Diagnosis not present

## 2022-01-30 DIAGNOSIS — M25569 Pain in unspecified knee: Secondary | ICD-10-CM | POA: Diagnosis not present

## 2022-02-04 DIAGNOSIS — I10 Essential (primary) hypertension: Secondary | ICD-10-CM | POA: Diagnosis not present

## 2022-02-04 DIAGNOSIS — E119 Type 2 diabetes mellitus without complications: Secondary | ICD-10-CM | POA: Diagnosis not present

## 2022-02-04 DIAGNOSIS — E785 Hyperlipidemia, unspecified: Secondary | ICD-10-CM | POA: Diagnosis not present

## 2022-02-20 DIAGNOSIS — E1129 Type 2 diabetes mellitus with other diabetic kidney complication: Secondary | ICD-10-CM | POA: Diagnosis not present

## 2022-02-21 ENCOUNTER — Telehealth (INDEPENDENT_AMBULATORY_CARE_PROVIDER_SITE_OTHER): Payer: Self-pay

## 2022-02-21 MED ORDER — PEG 3350-KCL-NA BICARB-NACL 420 G PO SOLR: 4000.0000 mL | ORAL | 0 refills | Status: DC

## 2022-02-21 NOTE — Patient Instructions (Signed)
? ? ? ? ? ? Bethany Ray ? 02/21/2022  ?  ? '@PREFPERIOPPHARMACY'$ @ ? ? Your procedure is scheduled on  02/27/2022. ? ? Report to [facility] at  1100  A.M. ? ? Call this number if you have problems the morning of surgery: ? [phone] ? ? Remember: ? Follow the diet and prep instructions given to you by the office. ? ?  DO NOT take any medications for diabetes the morning of your procedure. ?  ? Take these medicines the morning of surgery with A SIP OF WATER   ? ?                                          amlodipine. ?  ? ? Do not wear jewelry, make-up or nail polish. ? Do not wear lotions, powders, or perfumes, or deodorant. ? Do not shave 48 hours prior to surgery.  Men may shave face and neck. ? Do not bring valuables to the hospital. ? [facility] is not responsible for any belongings or valuables. ? ?Contacts, dentures or bridgework may not be worn into surgery.  Leave your suitcase in the car.  After surgery it may be brought to your room. ? ?For patients admitted to the hospital, discharge time will be determined by your treatment team. ? ?Patients discharged the day of surgery will not be allowed to drive home and must have someone with them for 24 hours.  ? ? ?Special instructions:   DO NOT smoke tobacco or vape for 24 hours before your procedure. ? ?Please read over the following fact sheets that you were given. ?Anesthesia Post-op Instructions and Care and Recovery After Surgery ?  ? ? ? Colonoscopy, Adult, Care After ?The following information offers guidance on how to care for yourself after your procedure. Your health care provider may also give you more specific instructions. If you have problems or questions, contact your health care provider. ?What can I expect after the procedure? ?After the procedure, it is common to have: ?A small amount of blood in your stool for 24 hours after the procedure. ?Some gas. ?Mild cramping or bloating of your abdomen. ?Follow these instructions at home: ?Eating and  drinking ? ?Drink enough fluid to keep your urine pale yellow. ?Follow instructions from your health care provider about eating or drinking restrictions. ?Resume your normal diet as told by your health care provider. Avoid heavy or fried foods that are hard to digest. ?Activity ?Rest as told by your health care provider. ?Avoid sitting for a long time without moving. Get up to take short walks every 1-2 hours. This is important to improve blood flow and breathing. Ask for help if you feel weak or unsteady. ?Return to your normal activities as told by your health care provider. Ask your health care provider what activities are safe for you. ?Managing cramping and bloating ? ?Try walking around when you have cramps or feel bloated. ?If directed, apply heat to your abdomen as told by your health care provider. Use the heat source that your health care provider recommends, such as a moist heat pack or a heating pad. ?Place a towel between your skin and the heat source. ?Leave the heat on for 20-30 minutes. ?Remove the heat if your skin turns bright red. This is especially important if you are unable to feel pain, heat, or cold. You have  a greater risk of getting burned. ?General instructions ?If you were given a sedative during the procedure, it can affect you for several hours. Do not drive or operate machinery until your health care provider says that it is safe. ?For the first 24 hours after the procedure: ?Do not sign important documents. ?Do not drink alcohol. ?Do your regular daily activities at a slower pace than normal. ?Eat soft foods that are easy to digest. ?Take over-the-counter and prescription medicines only as told by your health care provider. ?Keep all follow-up visits. This is important. ?Contact a health care provider if: ?You have blood in your stool 2-3 days after the procedure. ?Get help right away if: ?You have more than a small spotting of blood in your stool. ?You have large blood clots in your  stool. ?You have swelling of your abdomen. ?You have nausea or vomiting. ?You have a fever. ?You have increasing pain in your abdomen that is not relieved with medicine. ?These symptoms may be an emergency. Get help right away. Call 911. ?Do not wait to see if the symptoms will go away. ?Do not drive yourself to the hospital. ?Summary ?After the procedure, it is common to have a small amount of blood in your stool. You may also have mild cramping and bloating of your abdomen. ?If you were given a sedative during the procedure, it can affect you for several hours. Do not drive or operate machinery until your health care provider says that it is safe. ?Get help right away if you have a lot of blood in your stool, nausea or vomiting, a fever, or increased pain in your abdomen. ?This information is not intended to replace advice given to you by your health care provider. Make sure you discuss any questions you have with your health care provider. ?Document Revised: 05/17/2021 Document Reviewed: 05/17/2021 ?Elsevier Patient Education ? [place]. ?Monitored Anesthesia Care, Care After ?This sheet gives you information about how to care for yourself after your procedure. Your health care provider may also give you more specific instructions. If you have problems or questions, contact your health care provider. ?What can I expect after the procedure? ?After the procedure, it is common to have: ?Tiredness. ?Forgetfulness about what happened after the procedure. ?Impaired judgment for important decisions. ?Nausea or vomiting. ?Some difficulty with balance. ?Follow these instructions at home: ?For the time period you were told by your health care provider: ? ?  ? ?Rest as needed. ?Do not participate in activities where you could fall or become injured. ?Do not drive or use machinery. ?Do not drink alcohol. ?Do not take sleeping pills or medicines that cause drowsiness. ?Do not make important decisions or sign legal  documents. ?Do not take care of children on your own. ?Eating and drinking ?Follow the diet that is recommended by your health care provider. ?Drink enough fluid to keep your urine pale yellow. ?If you vomit: ?Drink water, juice, or soup when you can drink without vomiting. ?Make sure you have little or no nausea before eating solid foods. ?General instructions ?Have a responsible adult stay with you for the time you are told. It is important to have someone help care for you until you are awake and alert. ?Take over-the-counter and prescription medicines only as told by your health care provider. ?If you have sleep apnea, surgery and certain medicines can increase your risk for breathing problems. Follow instructions from your health care provider about wearing your sleep device: ?Anytime you are sleeping,  including during daytime naps. ?While taking prescription pain medicines, sleeping medicines, or medicines that make you drowsy. ?Avoid smoking. ?Keep all follow-up visits as told by your health care provider. This is important. ?Contact a health care provider if: ?You keep feeling nauseous or you keep vomiting. ?You feel light-headed. ?You are still sleepy or having trouble with balance after 24 hours. ?You develop a rash. ?You have a fever. ?You have redness or swelling around the IV site. ?Get help right away if: ?You have trouble breathing. ?You have new-onset confusion at home. ?Summary ?For several hours after your procedure, you may feel tired. You may also be forgetful and have poor judgment. ?Have a responsible adult stay with you for the time you are told. It is important to have someone help care for you until you are awake and alert. ?Rest as told. Do not drive or operate machinery. Do not drink alcohol or take sleeping pills. ?Get help right away if you have trouble breathing, or if you suddenly become confused. ?This information is not intended to replace advice given to you by your health care  provider. Make sure you discuss any questions you have with your health care provider. ?Document Revised: 08/29/2021 Document Reviewed: 08/27/2019 ?Elsevier Patient Education ? [place]. ? ?

## 2022-02-21 NOTE — Telephone Encounter (Signed)
Bethany Ray, CMA  ?

## 2022-02-23 ENCOUNTER — Encounter (HOSPITAL_COMMUNITY): Payer: Self-pay

## 2022-02-23 ENCOUNTER — Encounter (HOSPITAL_COMMUNITY)
Admission: RE | Admit: 2022-02-23 | Discharge: 2022-02-23 | Disposition: A | Discharge status: Home / Self Care | Payer: Medicare HMO | Source: Ambulatory Visit | Attending: Gastroenterology | Admitting: Gastroenterology

## 2022-02-23 DIAGNOSIS — I10 Essential (primary) hypertension: Secondary | ICD-10-CM

## 2022-02-23 NOTE — Pre-Procedure Instructions (Signed)
Patient did not show for her per-op. Lucendia Herrlich notified by interoffice message.

## 2022-02-26 ENCOUNTER — Encounter (INDEPENDENT_AMBULATORY_CARE_PROVIDER_SITE_OTHER): Payer: Self-pay

## 2022-03-06 DIAGNOSIS — M069 Rheumatoid arthritis, unspecified: Secondary | ICD-10-CM | POA: Diagnosis not present

## 2022-03-06 DIAGNOSIS — E1122 Type 2 diabetes mellitus with diabetic chronic kidney disease: Secondary | ICD-10-CM | POA: Diagnosis not present

## 2022-03-06 DIAGNOSIS — I10 Essential (primary) hypertension: Secondary | ICD-10-CM | POA: Diagnosis not present

## 2022-03-06 DIAGNOSIS — R7309 Other abnormal glucose: Secondary | ICD-10-CM | POA: Diagnosis not present

## 2022-03-07 DIAGNOSIS — E119 Type 2 diabetes mellitus without complications: Secondary | ICD-10-CM | POA: Diagnosis not present

## 2022-03-07 DIAGNOSIS — I10 Essential (primary) hypertension: Secondary | ICD-10-CM | POA: Diagnosis not present

## 2022-03-07 DIAGNOSIS — E785 Hyperlipidemia, unspecified: Secondary | ICD-10-CM | POA: Diagnosis not present

## 2022-03-27 NOTE — Patient Instructions (Signed)
Bethany Ray  03/27/2022     '@PREFPERIOPPHARMACY'$ @   Your procedure is scheduled on  04/03/2022.   Report to [facility] at  1230  P.M.   Call this number if you have problems the morning of surgery:  [phone]   Remember:  Follow the diet and prep instructions given to you by the office.     DO NOT take any medications for diabetes morning of your procedure.     Take these medicines the morning of surgery with A SIP OF WATER                                 norvasc, ultram.     Do not wear jewelry, make-up or nail polish.  Do not wear lotions, powders, or perfumes, or deodorant.  Do not shave 48 hours prior to surgery.  Men may shave face and neck.  Do not bring valuables to the hospital.  [facility] is not responsible for any belongings or valuables.  Contacts, dentures or bridgework may not be worn into surgery.  Leave your suitcase in the car.  After surgery it may be brought to your room.  For patients admitted to the hospital, discharge time will be determined by your treatment team.  Patients discharged the day of surgery will not be allowed to drive home and must have someone with them for 24 hours.    Special instructions:   DO NOT smoke tobacco or vape for 24 hours before your procedure.  Please read over the following fact sheets that you were given. Anesthesia Post-op Instructions and Care and Recovery After Surgery      Colonoscopy, Adult, Care After The following information offers guidance on how to care for yourself after your procedure. Your health care provider may also give you more specific instructions. If you have problems or questions, contact your health care provider. What can I expect after the procedure? After the procedure, it is common to have: A small amount of blood in your stool for 24 hours after the procedure. Some gas. Mild cramping or bloating of your abdomen. Follow these instructions at home: Eating and  drinking  Drink enough fluid to keep your urine pale yellow. Follow instructions from your health care provider about eating or drinking restrictions. Resume your normal diet as told by your health care provider. Avoid heavy or fried foods that are hard to digest. Activity Rest as told by your health care provider. Avoid sitting for a long time without moving. Get up to take short walks every 1-2 hours. This is important to improve blood flow and breathing. Ask for help if you feel weak or unsteady. Return to your normal activities as told by your health care provider. Ask your health care provider what activities are safe for you. Managing cramping and bloating  Try walking around when you have cramps or feel bloated. If directed, apply heat to your abdomen as told by your health care provider. Use the heat source that your health care provider recommends, such as a moist heat pack or a heating pad. Place a towel between your skin and the heat source. Leave the heat on for 20-30 minutes. Remove the heat if your skin turns bright red. This is especially important if you are unable to feel pain, heat, or cold. You have a greater risk of getting burned. General instructions If you were  given a sedative during the procedure, it can affect you for several hours. Do not drive or operate machinery until your health care provider says that it is safe. For the first 24 hours after the procedure: Do not sign important documents. Do not drink alcohol. Do your regular daily activities at a slower pace than normal. Eat soft foods that are easy to digest. Take over-the-counter and prescription medicines only as told by your health care provider. Keep all follow-up visits. This is important. Contact a health care provider if: You have blood in your stool 2-3 days after the procedure. Get help right away if: You have more than a small spotting of blood in your stool. You have large blood clots in your  stool. You have swelling of your abdomen. You have nausea or vomiting. You have a fever. You have increasing pain in your abdomen that is not relieved with medicine. These symptoms may be an emergency. Get help right away. Call 911. Do not wait to see if the symptoms will go away. Do not drive yourself to the hospital. Summary After the procedure, it is common to have a small amount of blood in your stool. You may also have mild cramping and bloating of your abdomen. If you were given a sedative during the procedure, it can affect you for several hours. Do not drive or operate machinery until your health care provider says that it is safe. Get help right away if you have a lot of blood in your stool, nausea or vomiting, a fever, or increased pain in your abdomen. This information is not intended to replace advice given to you by your health care provider. Make sure you discuss any questions you have with your health care provider. Document Revised: 05/17/2021 Document Reviewed: 05/17/2021 Elsevier Patient Education  [place] After This sheet gives you information about how to care for yourself after your procedure. Your health care provider may also give you more specific instructions. If you have problems or questions, contact your health care provider. What can I expect after the procedure? After the procedure, it is common to have: Tiredness. Forgetfulness about what happened after the procedure. Impaired judgment for important decisions. Nausea or vomiting. Some difficulty with balance. Follow these instructions at home: For the time period you were told by your health care provider:     Rest as needed. Do not participate in activities where you could fall or become injured. Do not drive or use machinery. Do not drink alcohol. Do not take sleeping pills or medicines that cause drowsiness. Do not make important decisions or sign legal  documents. Do not take care of children on your own. Eating and drinking Follow the diet that is recommended by your health care provider. Drink enough fluid to keep your urine pale yellow. If you vomit: Drink water, juice, or soup when you can drink without vomiting. Make sure you have little or no nausea before eating solid foods. General instructions Have a responsible adult stay with you for the time you are told. It is important to have someone help care for you until you are awake and alert. Take over-the-counter and prescription medicines only as told by your health care provider. If you have sleep apnea, surgery and certain medicines can increase your risk for breathing problems. Follow instructions from your health care provider about wearing your sleep device: Anytime you are sleeping, including during daytime naps. While taking prescription pain medicines, sleeping medicines,  or medicines that make you drowsy. Avoid smoking. Keep all follow-up visits as told by your health care provider. This is important. Contact a health care provider if: You keep feeling nauseous or you keep vomiting. You feel light-headed. You are still sleepy or having trouble with balance after 24 hours. You develop a rash. You have a fever. You have redness or swelling around the IV site. Get help right away if: You have trouble breathing. You have new-onset confusion at home. Summary For several hours after your procedure, you may feel tired. You may also be forgetful and have poor judgment. Have a responsible adult stay with you for the time you are told. It is important to have someone help care for you until you are awake and alert. Rest as told. Do not drive or operate machinery. Do not drink alcohol or take sleeping pills. Get help right away if you have trouble breathing, or if you suddenly become confused. This information is not intended to replace advice given to you by your health care  provider. Make sure you discuss any questions you have with your health care provider. Document Revised: 08/29/2021 Document Reviewed: 08/27/2019 Elsevier Patient Education  [place].

## 2022-03-29 ENCOUNTER — Encounter (HOSPITAL_COMMUNITY)
Admission: RE | Admit: 2022-03-29 | Discharge: 2022-03-29 | Disposition: A | Discharge status: Home / Self Care | Payer: Medicare HMO | Source: Ambulatory Visit | Attending: Gastroenterology | Admitting: Gastroenterology

## 2022-03-29 ENCOUNTER — Encounter (HOSPITAL_COMMUNITY): Payer: Self-pay

## 2022-03-29 DIAGNOSIS — Z800 Family history of malignant neoplasm of digestive organs: Secondary | ICD-10-CM | POA: Diagnosis not present

## 2022-03-29 DIAGNOSIS — I10 Essential (primary) hypertension: Secondary | ICD-10-CM

## 2022-03-29 DIAGNOSIS — Z01818 Encounter for other preprocedural examination: Secondary | ICD-10-CM | POA: Insufficient documentation

## 2022-03-29 DIAGNOSIS — Z1211 Encounter for screening for malignant neoplasm of colon: Secondary | ICD-10-CM | POA: Diagnosis not present

## 2022-03-29 LAB — BASIC METABOLIC PANEL
Anion gap: 10 (ref 5–15)
BUN: 28 mg/dL — ABNORMAL HIGH (ref 8–23)
CO2: 18 mmol/L — ABNORMAL LOW (ref 22–32)
Calcium: 9.4 mg/dL (ref 8.9–10.3)
Chloride: 108 mmol/L (ref 98–111)
Creatinine, Ser: 0.92 mg/dL (ref 0.44–1.00)
GFR, Estimated: >60 mL/min (ref >60)
Glucose, Bld: 191 mg/dL — ABNORMAL HIGH (ref 70–99)
Potassium: 3.8 mmol/L (ref 3.5–5.1)
Sodium: 136 mmol/L (ref 135–145)

## 2022-04-03 ENCOUNTER — Ambulatory Visit (HOSPITAL_COMMUNITY)
Admission: RE | Admit: 2022-04-03 | Discharge: 2022-04-03 | Disposition: A | Discharge status: Home / Self Care | Payer: Medicare HMO | Source: Ambulatory Visit | Attending: Gastroenterology | Admitting: Gastroenterology

## 2022-04-03 ENCOUNTER — Encounter (HOSPITAL_COMMUNITY): Payer: Self-pay | Admitting: Gastroenterology

## 2022-04-03 ENCOUNTER — Encounter (HOSPITAL_COMMUNITY)
Admission: RE | Disposition: A | Discharge status: Home / Self Care | Payer: Self-pay | Source: Ambulatory Visit | Attending: Gastroenterology

## 2022-04-03 ENCOUNTER — Ambulatory Visit (HOSPITAL_BASED_OUTPATIENT_CLINIC_OR_DEPARTMENT_OTHER): Payer: Medicare HMO | Admitting: Anesthesiology

## 2022-04-03 ENCOUNTER — Ambulatory Visit (HOSPITAL_COMMUNITY): Payer: Medicare HMO | Admitting: Anesthesiology

## 2022-04-03 DIAGNOSIS — K635 Polyp of colon: Secondary | ICD-10-CM | POA: Diagnosis not present

## 2022-04-03 DIAGNOSIS — E7800 Pure hypercholesterolemia, unspecified: Secondary | ICD-10-CM | POA: Diagnosis not present

## 2022-04-03 DIAGNOSIS — E119 Type 2 diabetes mellitus without complications: Secondary | ICD-10-CM | POA: Diagnosis not present

## 2022-04-03 DIAGNOSIS — Z7984 Long term (current) use of oral hypoglycemic drugs: Secondary | ICD-10-CM | POA: Diagnosis not present

## 2022-04-03 DIAGNOSIS — Z853 Personal history of malignant neoplasm of breast: Secondary | ICD-10-CM | POA: Insufficient documentation

## 2022-04-03 DIAGNOSIS — D123 Benign neoplasm of transverse colon: Secondary | ICD-10-CM | POA: Insufficient documentation

## 2022-04-03 DIAGNOSIS — I10 Essential (primary) hypertension: Secondary | ICD-10-CM | POA: Diagnosis not present

## 2022-04-03 DIAGNOSIS — Z1211 Encounter for screening for malignant neoplasm of colon: Secondary | ICD-10-CM

## 2022-04-03 DIAGNOSIS — K5730 Diverticulosis of large intestine without perforation or abscess without bleeding: Secondary | ICD-10-CM | POA: Diagnosis not present

## 2022-04-03 DIAGNOSIS — Z6835 Body mass index (BMI) 35.0-35.9, adult: Secondary | ICD-10-CM | POA: Insufficient documentation

## 2022-04-03 DIAGNOSIS — Z800 Family history of malignant neoplasm of digestive organs: Secondary | ICD-10-CM

## 2022-04-03 HISTORY — PX: POLYPECTOMY: SHX5525

## 2022-04-03 HISTORY — PX: COLONOSCOPY WITH PROPOFOL: SHX5780

## 2022-04-03 LAB — HM COLONOSCOPY

## 2022-04-03 LAB — GLUCOSE, CAPILLARY: Glucose-Capillary: 184 mg/dL — ABNORMAL HIGH (ref 70–99)

## 2022-04-03 SURGERY — COLONOSCOPY WITH PROPOFOL
Anesthesia: General

## 2022-04-03 MED ORDER — PROPOFOL 10 MG/ML IV BOLUS
INTRAVENOUS | Status: DC | PRN
  Administered 2022-04-03: 40 mg via INTRAVENOUS

## 2022-04-03 MED ORDER — PROPOFOL 500 MG/50ML IV EMUL
INTRAVENOUS | Status: DC | PRN
  Administered 2022-04-03: 75 ug/kg/min via INTRAVENOUS

## 2022-04-03 MED ORDER — LACTATED RINGERS IV SOLN: INTRAVENOUS | Status: DC

## 2022-04-04 ENCOUNTER — Encounter (INDEPENDENT_AMBULATORY_CARE_PROVIDER_SITE_OTHER): Payer: Self-pay | Admitting: *Deleted

## 2022-04-04 LAB — SURGICAL PATHOLOGY

## 2022-04-05 NOTE — Anesthesia Postprocedure Evaluation (Signed)
Anesthesia Post Note  Patient: Bethany Ray  Procedure(s) Performed: COLONOSCOPY WITH PROPOFOL POLYPECTOMY  Patient location during evaluation: Phase II Anesthesia Type: General Level of consciousness: awake Pain management: pain level controlled Vital Signs Assessment: post-procedure vital signs reviewed and stable Respiratory status: spontaneous breathing and respiratory function stable Cardiovascular status: blood pressure returned to baseline and stable Postop Assessment: no headache and no apparent nausea or vomiting Anesthetic complications: no Comments: Late entry   No notable events documented.   Last Vitals:  Vitals:   04/03/22 0954 04/03/22 1211  BP: (!) 133/53 (!) 128/55  Pulse: (!) 53 (!) 59  Resp: 20 18  Temp: 37 C 36.6 C  SpO2: 97% 95%    Last Pain:  Vitals:   04/03/22 1211  TempSrc: Oral  PainSc: 0-No pain                 [name]

## 2022-04-06 DIAGNOSIS — E119 Type 2 diabetes mellitus without complications: Secondary | ICD-10-CM | POA: Diagnosis not present

## 2022-04-06 DIAGNOSIS — E785 Hyperlipidemia, unspecified: Secondary | ICD-10-CM | POA: Diagnosis not present

## 2022-04-06 DIAGNOSIS — I10 Essential (primary) hypertension: Secondary | ICD-10-CM | POA: Diagnosis not present

## 2022-04-09 ENCOUNTER — Encounter (HOSPITAL_COMMUNITY): Payer: Self-pay | Admitting: Gastroenterology

## 2022-04-27 DIAGNOSIS — B0052 Herpesviral keratitis: Secondary | ICD-10-CM | POA: Diagnosis not present

## 2022-04-27 DIAGNOSIS — E119 Type 2 diabetes mellitus without complications: Secondary | ICD-10-CM | POA: Diagnosis not present

## 2022-04-27 DIAGNOSIS — H401111 Primary open-angle glaucoma, right eye, mild stage: Secondary | ICD-10-CM | POA: Diagnosis not present

## 2022-04-27 DIAGNOSIS — Z961 Presence of intraocular lens: Secondary | ICD-10-CM | POA: Diagnosis not present

## 2022-05-07 DIAGNOSIS — E119 Type 2 diabetes mellitus without complications: Secondary | ICD-10-CM | POA: Diagnosis not present

## 2022-05-07 DIAGNOSIS — I10 Essential (primary) hypertension: Secondary | ICD-10-CM | POA: Diagnosis not present

## 2022-05-07 DIAGNOSIS — E785 Hyperlipidemia, unspecified: Secondary | ICD-10-CM | POA: Diagnosis not present

## 2022-05-08 DIAGNOSIS — E1169 Type 2 diabetes mellitus with other specified complication: Secondary | ICD-10-CM | POA: Diagnosis not present

## 2022-05-08 DIAGNOSIS — M25569 Pain in unspecified knee: Secondary | ICD-10-CM | POA: Diagnosis not present

## 2022-05-08 DIAGNOSIS — M170 Bilateral primary osteoarthritis of knee: Secondary | ICD-10-CM | POA: Diagnosis not present

## 2022-05-08 DIAGNOSIS — Z79899 Other long term (current) drug therapy: Secondary | ICD-10-CM | POA: Diagnosis not present

## 2022-05-08 DIAGNOSIS — M549 Dorsalgia, unspecified: Secondary | ICD-10-CM | POA: Diagnosis not present

## 2022-05-08 DIAGNOSIS — M79643 Pain in unspecified hand: Secondary | ICD-10-CM | POA: Diagnosis not present

## 2022-05-08 DIAGNOSIS — Z13820 Encounter for screening for osteoporosis: Secondary | ICD-10-CM | POA: Diagnosis not present

## 2022-05-08 DIAGNOSIS — E785 Hyperlipidemia, unspecified: Secondary | ICD-10-CM | POA: Diagnosis not present

## 2022-05-08 DIAGNOSIS — M0579 Rheumatoid arthritis with rheumatoid factor of multiple sites without organ or systems involvement: Secondary | ICD-10-CM | POA: Diagnosis not present

## 2022-05-29 DIAGNOSIS — E1129 Type 2 diabetes mellitus with other diabetic kidney complication: Secondary | ICD-10-CM | POA: Diagnosis not present

## 2022-06-05 DIAGNOSIS — M069 Rheumatoid arthritis, unspecified: Secondary | ICD-10-CM | POA: Diagnosis not present

## 2022-06-05 DIAGNOSIS — R7309 Other abnormal glucose: Secondary | ICD-10-CM | POA: Diagnosis not present

## 2022-06-05 DIAGNOSIS — E1122 Type 2 diabetes mellitus with diabetic chronic kidney disease: Secondary | ICD-10-CM | POA: Diagnosis not present

## 2022-06-05 DIAGNOSIS — I10 Essential (primary) hypertension: Secondary | ICD-10-CM | POA: Diagnosis not present

## 2022-06-07 DIAGNOSIS — I10 Essential (primary) hypertension: Secondary | ICD-10-CM | POA: Diagnosis not present

## 2022-06-07 DIAGNOSIS — E785 Hyperlipidemia, unspecified: Secondary | ICD-10-CM | POA: Diagnosis not present

## 2022-06-07 DIAGNOSIS — E119 Type 2 diabetes mellitus without complications: Secondary | ICD-10-CM | POA: Diagnosis not present

## 2022-06-26 DIAGNOSIS — Z1231 Encounter for screening mammogram for malignant neoplasm of breast: Secondary | ICD-10-CM | POA: Diagnosis not present

## 2022-07-12 DIAGNOSIS — Z853 Personal history of malignant neoplasm of breast: Secondary | ICD-10-CM | POA: Diagnosis not present

## 2022-07-12 DIAGNOSIS — N951 Menopausal and female climacteric states: Secondary | ICD-10-CM | POA: Diagnosis not present

## 2022-07-12 DIAGNOSIS — R140 Abdominal distension (gaseous): Secondary | ICD-10-CM | POA: Diagnosis not present

## 2022-07-12 DIAGNOSIS — Z01419 Encounter for gynecological examination (general) (routine) without abnormal findings: Secondary | ICD-10-CM | POA: Diagnosis not present

## 2022-07-31 DIAGNOSIS — R140 Abdominal distension (gaseous): Secondary | ICD-10-CM | POA: Diagnosis not present

## 2022-08-07 DIAGNOSIS — E785 Hyperlipidemia, unspecified: Secondary | ICD-10-CM | POA: Diagnosis not present

## 2022-08-07 DIAGNOSIS — I10 Essential (primary) hypertension: Secondary | ICD-10-CM | POA: Diagnosis not present

## 2022-08-07 DIAGNOSIS — E119 Type 2 diabetes mellitus without complications: Secondary | ICD-10-CM | POA: Diagnosis not present

## 2022-08-14 DIAGNOSIS — M0579 Rheumatoid arthritis with rheumatoid factor of multiple sites without organ or systems involvement: Secondary | ICD-10-CM | POA: Diagnosis not present

## 2022-08-14 DIAGNOSIS — E1169 Type 2 diabetes mellitus with other specified complication: Secondary | ICD-10-CM | POA: Diagnosis not present

## 2022-08-14 DIAGNOSIS — M25562 Pain in left knee: Secondary | ICD-10-CM | POA: Diagnosis not present

## 2022-08-14 DIAGNOSIS — M79643 Pain in unspecified hand: Secondary | ICD-10-CM | POA: Diagnosis not present

## 2022-08-14 DIAGNOSIS — Z79899 Other long term (current) drug therapy: Secondary | ICD-10-CM | POA: Diagnosis not present

## 2022-08-14 DIAGNOSIS — Z13820 Encounter for screening for osteoporosis: Secondary | ICD-10-CM | POA: Diagnosis not present

## 2022-08-14 DIAGNOSIS — M549 Dorsalgia, unspecified: Secondary | ICD-10-CM | POA: Diagnosis not present

## 2022-08-14 DIAGNOSIS — M170 Bilateral primary osteoarthritis of knee: Secondary | ICD-10-CM | POA: Diagnosis not present

## 2022-08-14 DIAGNOSIS — E785 Hyperlipidemia, unspecified: Secondary | ICD-10-CM | POA: Diagnosis not present

## 2022-08-28 DIAGNOSIS — E1140 Type 2 diabetes mellitus with diabetic neuropathy, unspecified: Secondary | ICD-10-CM | POA: Diagnosis not present

## 2022-09-07 DIAGNOSIS — E1140 Type 2 diabetes mellitus with diabetic neuropathy, unspecified: Secondary | ICD-10-CM | POA: Diagnosis not present

## 2022-09-07 DIAGNOSIS — M069 Rheumatoid arthritis, unspecified: Secondary | ICD-10-CM | POA: Diagnosis not present

## 2022-09-07 DIAGNOSIS — R7309 Other abnormal glucose: Secondary | ICD-10-CM | POA: Diagnosis not present

## 2022-09-07 DIAGNOSIS — I10 Essential (primary) hypertension: Secondary | ICD-10-CM | POA: Diagnosis not present

## 2022-10-07 DIAGNOSIS — I10 Essential (primary) hypertension: Secondary | ICD-10-CM | POA: Diagnosis not present

## 2022-10-07 DIAGNOSIS — E119 Type 2 diabetes mellitus without complications: Secondary | ICD-10-CM | POA: Diagnosis not present

## 2022-10-07 DIAGNOSIS — E785 Hyperlipidemia, unspecified: Secondary | ICD-10-CM | POA: Diagnosis not present

## 2022-11-06 DIAGNOSIS — E785 Hyperlipidemia, unspecified: Secondary | ICD-10-CM | POA: Diagnosis not present

## 2022-11-06 DIAGNOSIS — I10 Essential (primary) hypertension: Secondary | ICD-10-CM | POA: Diagnosis not present

## 2022-11-06 DIAGNOSIS — E119 Type 2 diabetes mellitus without complications: Secondary | ICD-10-CM | POA: Diagnosis not present

## 2022-12-04 DIAGNOSIS — I10 Essential (primary) hypertension: Secondary | ICD-10-CM | POA: Diagnosis not present

## 2022-12-04 DIAGNOSIS — M069 Rheumatoid arthritis, unspecified: Secondary | ICD-10-CM | POA: Diagnosis not present

## 2022-12-04 DIAGNOSIS — E1140 Type 2 diabetes mellitus with diabetic neuropathy, unspecified: Secondary | ICD-10-CM | POA: Diagnosis not present

## 2022-12-10 DIAGNOSIS — I10 Essential (primary) hypertension: Secondary | ICD-10-CM | POA: Diagnosis not present

## 2022-12-10 DIAGNOSIS — R079 Chest pain, unspecified: Secondary | ICD-10-CM | POA: Diagnosis not present

## 2022-12-10 DIAGNOSIS — E785 Hyperlipidemia, unspecified: Secondary | ICD-10-CM | POA: Diagnosis not present

## 2022-12-10 DIAGNOSIS — M069 Rheumatoid arthritis, unspecified: Secondary | ICD-10-CM | POA: Diagnosis not present

## 2022-12-10 DIAGNOSIS — R7309 Other abnormal glucose: Secondary | ICD-10-CM | POA: Diagnosis not present

## 2022-12-10 DIAGNOSIS — E1122 Type 2 diabetes mellitus with diabetic chronic kidney disease: Secondary | ICD-10-CM | POA: Diagnosis not present

## 2023-01-06 DIAGNOSIS — I10 Essential (primary) hypertension: Secondary | ICD-10-CM | POA: Diagnosis not present

## 2023-01-06 DIAGNOSIS — E785 Hyperlipidemia, unspecified: Secondary | ICD-10-CM | POA: Diagnosis not present

## 2023-01-06 DIAGNOSIS — E119 Type 2 diabetes mellitus without complications: Secondary | ICD-10-CM | POA: Diagnosis not present

## 2023-01-11 DIAGNOSIS — M170 Bilateral primary osteoarthritis of knee: Secondary | ICD-10-CM | POA: Diagnosis not present

## 2023-01-11 DIAGNOSIS — E785 Hyperlipidemia, unspecified: Secondary | ICD-10-CM | POA: Diagnosis not present

## 2023-01-11 DIAGNOSIS — Z13820 Encounter for screening for osteoporosis: Secondary | ICD-10-CM | POA: Diagnosis not present

## 2023-01-11 DIAGNOSIS — M0579 Rheumatoid arthritis with rheumatoid factor of multiple sites without organ or systems involvement: Secondary | ICD-10-CM | POA: Diagnosis not present

## 2023-01-11 DIAGNOSIS — M79643 Pain in unspecified hand: Secondary | ICD-10-CM | POA: Diagnosis not present

## 2023-01-11 DIAGNOSIS — Z79899 Other long term (current) drug therapy: Secondary | ICD-10-CM | POA: Diagnosis not present

## 2023-01-11 DIAGNOSIS — M549 Dorsalgia, unspecified: Secondary | ICD-10-CM | POA: Diagnosis not present

## 2023-01-11 DIAGNOSIS — M25569 Pain in unspecified knee: Secondary | ICD-10-CM | POA: Diagnosis not present

## 2023-01-11 DIAGNOSIS — E1169 Type 2 diabetes mellitus with other specified complication: Secondary | ICD-10-CM | POA: Diagnosis not present

## 2023-01-14 DIAGNOSIS — R040 Epistaxis: Secondary | ICD-10-CM | POA: Insufficient documentation

## 2023-01-14 DIAGNOSIS — Z5321 Procedure and treatment not carried out due to patient leaving prior to being seen by health care provider: Secondary | ICD-10-CM | POA: Diagnosis not present

## 2023-01-15 ENCOUNTER — Other Ambulatory Visit: Payer: Self-pay

## 2023-01-15 ENCOUNTER — Encounter (HOSPITAL_COMMUNITY): Payer: Self-pay

## 2023-01-15 ENCOUNTER — Emergency Department (HOSPITAL_COMMUNITY)
Admission: EM | Admit: 2023-01-15 | Discharge: 2023-01-15 | Discharge status: Against Medical Advice | Payer: Medicare HMO | Attending: Emergency Medicine | Admitting: Emergency Medicine

## 2023-01-15 ENCOUNTER — Ambulatory Visit
Admission: EM | Admit: 2023-01-15 | Discharge: 2023-01-15 | Disposition: A | Discharge status: Home / Self Care | Payer: Medicare HMO | Attending: Urgent Care | Admitting: Urgent Care

## 2023-01-15 DIAGNOSIS — D849 Immunodeficiency, unspecified: Secondary | ICD-10-CM | POA: Diagnosis not present

## 2023-01-15 DIAGNOSIS — J018 Other acute sinusitis: Secondary | ICD-10-CM

## 2023-01-15 MED ORDER — CETIRIZINE HCL 10 MG PO TABS: 10.0000 mg | ORAL_TABLET | Freq: Every day | ORAL | 0 refills | Status: DC

## 2023-01-15 MED ORDER — DOXYCYCLINE HYCLATE 100 MG PO CAPS
100.0000 mg | ORAL_CAPSULE | Freq: Two times a day (BID) | ORAL | 0 refills | Status: DC
Start: 2023-01-15 — End: 2023-02-22

## 2023-01-15 NOTE — ED Triage Notes (Signed)
Pt presents with nose bleed that started tonight for around 2 hours. Pt also had one on Sunday. Pt is not on blood thinner. States she has been passing clots out of her nose.

## 2023-01-15 NOTE — ED Provider Notes (Signed)
[facility]  Note:  This document was prepared using Conservation officer, historic buildings and may include unintentional dictation errors.  MRN: [id number] DOB: 03-03-1946  Subjective:   Bethany Ray is a 77 y.o. female presenting for 5-day history of persistent sinus congestion, sinus drainage, scratchy throat, intermittent coughing.  She has now had nosebleeds in the past couple of days.  None today.  She does not take any allergy medicines.  No overt chest pain, shortness of breath or wheezing.  She is supposed to be on methotrexate but is not currently taking this.  No current facility-administered medications for this encounter.  Current Outpatient Medications:    acetaminophen (TYLENOL) 500 MG tablet, Take 500 mg by mouth every 8 (eight) hours as needed for moderate pain., Disp: , Rfl:    amLODipine (NORVASC) 10 MG tablet, Take 10 mg by mouth daily., Disp: , Rfl:    aspirin EC 81 MG tablet, Take 81 mg by mouth daily., Disp: , Rfl:    B Complex-C (B-COMPLEX WITH VITAMIN C) tablet, Take 1 tablet by mouth daily., Disp: , Rfl:    empagliflozin (JARDIANCE) 10 MG TABS tablet, Take 10 mg by mouth daily., Disp: , Rfl:    folic acid (FOLVITE) 1 MG tablet, Take 1 mg by mouth daily., Disp: , Rfl:    glimepiride (AMARYL) 1 MG tablet, Take 1 mg by mouth daily with breakfast. , Disp: , Rfl:    hydrochlorothiazide (HYDRODIURIL) 25 MG tablet, Take 25 mg by mouth daily., Disp: , Rfl:    loperamide (IMODIUM A-D) 2 MG tablet, Take 2-4 mg by mouth 4 (four) times daily as needed for diarrhea or loose stools., Disp: , Rfl:    metFORMIN (GLUCOPHAGE-XR) 500 MG 24 hr tablet, Take 500 mg by mouth 2 (two) times daily. , Disp: , Rfl:    methotrexate (RHEUMATREX) 2.5 MG tablet, Take 10 mg by mouth 2 (two) times a week. Thursdays and Fridays, Disp: , Rfl:    Multiple Vitamin (MULITIVITAMIN WITH MINERALS) TABS, Take 1 tablet by mouth daily., Disp: , Rfl:    olmesartan (BENICAR) 40 MG tablet,  Take 40 mg by mouth daily., Disp: , Rfl:    pravastatin (PRAVACHOL) 20 MG tablet, Take 20 mg by mouth every evening. , Disp: , Rfl:    spironolactone (ALDACTONE) 25 MG tablet, Take 25 mg by mouth daily., Disp: , Rfl:    traMADol (ULTRAM) 50 MG tablet, Take 50 mg by mouth every 6 (six) hours as needed for moderate pain., Disp: , Rfl:    Turmeric Curcumin 500 MG CAPS, Take 500 mg by mouth daily., Disp: , Rfl:    valACYclovir (VALTREX) 1000 MG tablet, Take 1,000 mg by mouth daily., Disp: , Rfl:    Allergies  Allergen Reactions   Penicillins Nausea And Vomiting    Childhood allergy     Past Medical History:  Diagnosis Date   Arthritis    Breast cancer 09/14/2013   Cancer approx 2006   rt breast/lumpectomy/chemo/rad tx   Diabetes mellitus    High cholesterol    Hypertension    Shingles      Past Surgical History:  Procedure Laterality Date   ABDOMINAL HYSTERECTOMY     BREAST SURGERY     CHOLECYSTECTOMY     COLONOSCOPY WITH PROPOFOL N/A 04/03/2022   Procedure: COLONOSCOPY WITH PROPOFOL;  Surgeon: Dolores Frame, MD;  Location: [facility];  Service: Gastroenterology;  Laterality: N/A;  1:00, per Soledad Gerlach pt  knows to arrive at 9:00   IR RADIOLOGY PERIPHERAL GUIDED IV START  11/18/2019   IR US GUIDE VASC ACCESS RIGHT  11/18/2019   POLYPECTOMY  04/03/2022   Procedure: POLYPECTOMY;  Surgeon: Dolores Frame, MD;  Location: [facility];  Service: Gastroenterology;;    No family history on file.  Social History   Tobacco Use   Smoking status: Never   Smokeless tobacco: Never  Vaping Use   Vaping Use: Never used  Substance Use Topics   Alcohol use: No   Drug use: No    ROS   Objective:   Vitals: BP 125/73 (BP Location: Left Arm)   Pulse (!) 54   Temp 97.9 F (36.6 C) (Oral)   Resp 20   SpO2 96%   Physical Exam Constitutional:      General: She is not in acute distress.    Appearance: Normal appearance. She is well-developed and normal  weight. She is not ill-appearing, toxic-appearing or diaphoretic.  HENT:     Head: Normocephalic and atraumatic.     Right Ear: Tympanic membrane, ear canal and external ear normal. No drainage or tenderness. No middle ear effusion. There is no impacted cerumen. Tympanic membrane is not erythematous or bulging.     Left Ear: Tympanic membrane, ear canal and external ear normal. No drainage or tenderness.  No middle ear effusion. There is no impacted cerumen. Tympanic membrane is not erythematous or bulging.     Nose: Congestion present. No rhinorrhea.     Comments: Nasal mucosa boggy and edematous.    Mouth/Throat:     Mouth: Mucous membranes are moist. No oral lesions.     Pharynx: No pharyngeal swelling, oropharyngeal exudate, posterior oropharyngeal erythema or uvula swelling.     Tonsils: No tonsillar exudate or tonsillar abscesses.  Eyes:     General: No scleral icterus.       Right eye: No discharge.        Left eye: No discharge.     Extraocular Movements: Extraocular movements intact.     Right eye: Normal extraocular motion.     Left eye: Normal extraocular motion.     Conjunctiva/sclera: Conjunctivae normal.  Cardiovascular:     Rate and Rhythm: Normal rate and regular rhythm.     Heart sounds: Normal heart sounds. No murmur heard.    No friction rub. No gallop.  Pulmonary:     Effort: Pulmonary effort is normal. No respiratory distress.     Breath sounds: No stridor. No wheezing, rhonchi or rales.  Chest:     Chest wall: No tenderness.  Musculoskeletal:     Cervical back: Normal range of motion and neck supple.  Lymphadenopathy:     Cervical: No cervical adenopathy.  Skin:    General: Skin is warm and dry.  Neurological:     General: No focal deficit present.     Mental Status: She is alert and oriented to person, place, and time.  Psychiatric:        Mood and Affect: Mood normal.        Behavior: Behavior normal.     Assessment and Plan :   PDMP not reviewed  this encounter.  1. Acute non-recurrent sinusitis of other sinus   2. Immunocompromised     Deferred imaging given clear cardiopulmonary exam, hemodynamically stable vital signs. Will start empiric treatment for sinusitis especially as she is immunocompromise.  She is to start treatment with doxycycline.  Recommended supportive care otherwise.  Counseled patient on potential for adverse effects with medications prescribed/recommended today, ER and return-to-clinic precautions discussed, patient verbalized understanding.    Wallis BambergMani, Aristide Waggle, [place] JerseyPA-C 01/15/23 1220

## 2023-01-15 NOTE — ED Triage Notes (Addendum)
Pt c/o intermittent nosebleeds started 4/5-coughing/spitting up blood clots-no nosebleed at present-NAD-steady gait-pt LWBS [facility] ED this am due to wait

## 2023-01-15 NOTE — Discharge Instructions (Addendum)
We will manage this as a sinus infection with doxycycline. For sore throat or cough try using a honey-based tea. Use 3 teaspoons of honey with juice squeezed from half lemon. Place shaved pieces of ginger into 1/2-1 cup of water and warm over stove top. Then mix the ingredients and repeat every 4 hours as needed. Please take with Tylenol 500mg -650mg  every 6 hours for throat pain, fevers, aches and pains. Hydrate very well with at least 2 liters of water. Eat light meals such as soups (chicken and noodles, vegetable, chicken and wild rice).  Do not eat foods that you are allergic to.  Taking an antihistamine like Zyrtec can help against postnasal drainage, sinus congestion which can cause sinus pain, sinus headaches, throat pain, painful swallowing, coughing.

## 2023-01-16 DIAGNOSIS — H1789 Other corneal scars and opacities: Secondary | ICD-10-CM | POA: Diagnosis not present

## 2023-01-16 DIAGNOSIS — H40051 Ocular hypertension, right eye: Secondary | ICD-10-CM | POA: Diagnosis not present

## 2023-01-18 ENCOUNTER — Telehealth: Payer: Self-pay

## 2023-01-18 NOTE — Telephone Encounter (Signed)
        Patient  visited [facility] on 01/15/2023  for epistaxis.   Telephone encounter attempt :  1st  No answer unable to leave message.   Sheryle Vice [facility]  [facility] Population Health Community Resource Care Guide   [email or web address]  ?? [phone]   Website: [email or web address]  [email or web address]

## 2023-01-21 ENCOUNTER — Telehealth: Payer: Self-pay

## 2023-01-21 NOTE — Telephone Encounter (Signed)
     Patient  visit on 01/15/2023  at [facility] was for Epistaxis. LWBS after triage  Have you been able to follow up with your primary care physician?   The patient was or was not able to obtain any needed medicine or equipment.  Are there diet recommendations that you are having difficulty following?  Patient expresses understanding of discharge instructions and education provided has no other needs at this time.    Khristen Cheyney [facility]  [facility] Population Health Community Resource Care Guide   [email or web address]  ?? [phone]   Website: [email or web address]  [email or web address]

## 2023-02-05 ENCOUNTER — Ambulatory Visit: Payer: Medicare HMO | Admitting: Podiatry

## 2023-02-05 ENCOUNTER — Encounter: Payer: Self-pay | Admitting: Podiatry

## 2023-02-05 DIAGNOSIS — E785 Hyperlipidemia, unspecified: Secondary | ICD-10-CM | POA: Diagnosis not present

## 2023-02-05 DIAGNOSIS — B351 Tinea unguium: Secondary | ICD-10-CM

## 2023-02-05 DIAGNOSIS — M79674 Pain in right toe(s): Secondary | ICD-10-CM

## 2023-02-05 DIAGNOSIS — Z7984 Long term (current) use of oral hypoglycemic drugs: Secondary | ICD-10-CM | POA: Diagnosis not present

## 2023-02-05 DIAGNOSIS — I10 Essential (primary) hypertension: Secondary | ICD-10-CM | POA: Diagnosis not present

## 2023-02-05 DIAGNOSIS — M79675 Pain in left toe(s): Secondary | ICD-10-CM | POA: Diagnosis not present

## 2023-02-05 DIAGNOSIS — E119 Type 2 diabetes mellitus without complications: Secondary | ICD-10-CM | POA: Insufficient documentation

## 2023-02-05 NOTE — Progress Notes (Signed)
  Subjective:  Patient ID: Bethany Ray, female    DOB: [birth year],   MRN: [id number]  Chief Complaint  Patient presents with   diabetic foot care    77 y.o. female presents for concern of thickened elongated and painful nails that are difficult to trim. Requesting to have them trimmed today. Relates burning and tingling in their feet. Patient is diabetic and last A1c was No results found for: "HGBA1C" .   PCP:  Carylon Perches, MD    . Denies any other pedal complaints. Denies n/v/f/c.   Past Medical History:  Diagnosis Date   Arthritis    Breast cancer (HCC) 09/14/2013   Cancer (HCC) approx 2006   rt breast/lumpectomy/chemo/rad tx   Diabetes mellitus    High cholesterol    Hypertension    Shingles     Objective:  Physical Exam: Vascular: DP/PT pulses 2/4 bilateral. CFT <3 seconds. Absent hair growth on digits. Edema noted to bilateral lower extremities. Xerosis noted bilaterally.  Skin. No lacerations or abrasions bilateral feet. Nails 1-5 bilateral  are thickened discolored and elongated with subungual debris.  Musculoskeletal: MMT 5/5 bilateral lower extremities in DF, PF, Inversion and Eversion. Deceased ROM in DF of ankle joint.  Neurological: Sensation intact to light touch. Protective sensation diminished bilateral.    Assessment:   1. Pain due to onychomycosis of toenails of both feet   2. Type 2 diabetes mellitus without complication, without long-term current use of insulin (HCC)   3. Diabetes mellitus treated with oral medication (HCC)      Plan:  Patient was evaluated and treated and all questions answered. -Discussed and educated patient on diabetic foot care, especially with  regards to the vascular, neurological and musculoskeletal systems.  -Stressed the importance of good glycemic control and the detriment of not  controlling glucose levels in relation to the foot. -Discussed supportive shoes at all times and checking feet regularly.  -Mechanically  debrided all nails 1-5 bilateral using sterile nail nipper and filed with dremel without incident  -Answered all patient questions -Patient to return  in 3 months for at risk foot care -Patient advised to call the office if any problems or questions arise in the meantime.   [name], DPM

## 2023-02-22 ENCOUNTER — Ambulatory Visit
Admission: EM | Admit: 2023-02-22 | Discharge: 2023-02-22 | Disposition: A | Discharge status: Home / Self Care | Payer: Medicare HMO | Attending: Internal Medicine | Admitting: Internal Medicine

## 2023-02-22 DIAGNOSIS — R6889 Other general symptoms and signs: Secondary | ICD-10-CM | POA: Insufficient documentation

## 2023-02-22 DIAGNOSIS — J069 Acute upper respiratory infection, unspecified: Secondary | ICD-10-CM | POA: Insufficient documentation

## 2023-02-22 DIAGNOSIS — E7800 Pure hypercholesterolemia, unspecified: Secondary | ICD-10-CM | POA: Diagnosis not present

## 2023-02-22 DIAGNOSIS — J029 Acute pharyngitis, unspecified: Secondary | ICD-10-CM | POA: Insufficient documentation

## 2023-02-22 DIAGNOSIS — I10 Essential (primary) hypertension: Secondary | ICD-10-CM | POA: Insufficient documentation

## 2023-02-22 DIAGNOSIS — E119 Type 2 diabetes mellitus without complications: Secondary | ICD-10-CM | POA: Diagnosis not present

## 2023-02-22 DIAGNOSIS — B9789 Other viral agents as the cause of diseases classified elsewhere: Secondary | ICD-10-CM | POA: Diagnosis not present

## 2023-02-22 DIAGNOSIS — Z1152 Encounter for screening for COVID-19: Secondary | ICD-10-CM | POA: Insufficient documentation

## 2023-02-22 DIAGNOSIS — Z7984 Long term (current) use of oral hypoglycemic drugs: Secondary | ICD-10-CM | POA: Diagnosis not present

## 2023-02-22 LAB — POCT INFLUENZA A/B
Influenza A, POC: NEGATIVE
Influenza B, POC: NEGATIVE

## 2023-02-22 LAB — POCT RAPID STREP A (OFFICE): Rapid Strep A Screen: NEGATIVE

## 2023-02-22 MED ORDER — FLUTICASONE PROPIONATE 50 MCG/ACT NA SUSP
2.0000 | Freq: Every day | NASAL | 0 refills | Status: AC
Start: 2023-02-22 — End: ?

## 2023-02-22 MED ORDER — BENZONATATE 200 MG PO CAPS: 200.0000 mg | ORAL_CAPSULE | Freq: Three times a day (TID) | ORAL | 0 refills | Status: DC | PRN

## 2023-02-22 NOTE — ED Triage Notes (Signed)
Sore throat, runny nose, chills, cough with green mucus that started 3 days ago. Taking tylenol.

## 2023-02-22 NOTE — Discharge Instructions (Addendum)
Your exam does not show a bacterial infection, but a virus like a cold virus like rhinovirus, adnovirus, parainfluenza viruses to name a few and antibiotics do not help this.  Do saline nose rinses with [facility] Med nasal saline rinses twice a day for 3-5 days for the stuffy nose and to cleanse the post nasal drainage from the back of your throat which could be what is causing the irritation  We are sending the second swab for a throat culture and we will call you if the results come back positive.  I am sending Flonase nose spray to help with the stuffy nose and yellow pearl pills for the cough

## 2023-02-22 NOTE — ED Provider Notes (Signed)
[facility]    CSN: [id number] Arrival date & time: 02/22/23  0805      History   Chief Complaint No chief complaint on file.   HPI Bethany Ray is a 77 y.o. female who presents with onset of ST, rhinitis, HA and body aches and productive cough with green mucous, chills x 3 days. Has been taking Tylenol. Her appetite is down and has been fatigued. Denies being SOB, just stuffy. 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 She denies being sick in the past month with URI or allergies. Denies hx of sinus surgery.     Past Medical History:  Diagnosis Date   Arthritis    Breast cancer (HCC) 09/14/2013   Cancer (HCC) approx 2006   rt breast/lumpectomy/chemo/rad tx   Diabetes mellitus    High cholesterol    Hypertension    Shingles     Patient Active Problem List   Diagnosis Date Noted   Type 2 diabetes mellitus (HCC) 02/05/2023   Loose stools 11/30/2021   Encounter for colonoscopy in patient with family history of colon cancer 05/25/2019   Chest pain 05/25/2019   Unilateral primary osteoarthritis, right knee 02/27/2017   Unilateral primary osteoarthritis, left knee 02/27/2017   Breast cancer of lower-inner quadrant of right female breast (HCC) 09/14/2013   STRESS FRACTURE-SITE 11/19/2007   ANKLE SPRAIN 11/19/2007    Past Surgical History:  Procedure Laterality Date   ABDOMINAL HYSTERECTOMY     BREAST SURGERY     CHOLECYSTECTOMY     COLONOSCOPY WITH PROPOFOL N/A 04/03/2022   Procedure: COLONOSCOPY WITH PROPOFOL;  Surgeon: Dolores Frame, MD;  Location: [facility];  Service: Gastroenterology;  Laterality: N/A;  1:00, per Soledad Gerlach pt knows to arrive at 9:00   IR RADIOLOGY PERIPHERAL GUIDED IV START  11/18/2019   IR US GUIDE VASC ACCESS RIGHT  11/18/2019   POLYPECTOMY  04/03/2022   Procedure: POLYPECTOMY;  Surgeon: Dolores Frame, MD;  Location: [facility];  Service:  Gastroenterology;;    OB History     Gravida      Para      Term      Preterm      AB      Living  2      SAB      IAB      Ectopic      Multiple      Live Births               Home Medications    Prior to Admission medications   Medication Sig Start Date End Date Taking? Authorizing Provider  acetaminophen (TYLENOL) 500 MG tablet Take 500 mg by mouth every 8 (eight) hours as needed for moderate pain.   Yes [provider]  amLODipine (NORVASC) 10 MG tablet Take 10 mg by mouth daily.   Yes [provider]  aspirin EC 81 MG tablet Take 81 mg by mouth daily.   Yes [provider]  B Complex-C (B-COMPLEX WITH VITAMIN C) tablet Take 1 tablet by mouth daily.   Yes [provider]  benzonatate (TESSALON) 200 MG capsule Take 1 capsule (200 mg total) by mouth 3 (three) times daily as needed for cough. 02/22/23  Yes Rodriguez-Southworth, Nettie Elm, PA-C  cetirizine (ZYRTEC ALLERGY) 10 MG tablet Take 1 tablet (10 mg total) by mouth daily. 01/15/23  Yes Wallis Bamberg, PA-C  empagliflozin (JARDIANCE) 10 MG TABS tablet Take 10 mg by mouth daily.   Yes [provider]  fluticasone (FLONASE) 50 MCG/ACT nasal spray Place 2 sprays into both nostrils daily. 02/22/23  Yes Rodriguez-Southworth, Nettie Elm, PA-C  folic acid (FOLVITE) 1 MG tablet Take 1 mg by mouth daily.   Yes [provider]  glimepiride (AMARYL) 1 MG tablet Take 1 mg by mouth daily with breakfast.  06/16/16  Yes [provider]  hydrochlorothiazide (HYDRODIURIL) 25 MG tablet Take 25 mg by mouth daily.   Yes [provider]  loperamide (IMODIUM A-D) 2 MG tablet Take 2-4 mg by mouth 4 (four) times daily as needed for diarrhea or loose stools.   Yes [provider]  metFORMIN (GLUCOPHAGE-XR) 500 MG 24 hr tablet Take 500 mg by mouth 2 (two) times daily.  08/29/16  Yes [provider]  methotrexate (RHEUMATREX) 2.5 MG tablet Take 10 mg by mouth 2  (two) times a week. Thursdays and Fridays 08/15/16  Yes [provider]  Multiple Vitamin (MULITIVITAMIN WITH MINERALS) TABS Take 1 tablet by mouth daily.   Yes [provider]  olmesartan (BENICAR) 40 MG tablet Take 40 mg by mouth daily. 02/21/22  Yes [provider]  pravastatin (PRAVACHOL) 20 MG tablet Take 20 mg by mouth every evening.    Yes [provider]  spironolactone (ALDACTONE) 25 MG tablet Take 25 mg by mouth daily.   Yes [provider]  Turmeric Curcumin 500 MG CAPS Take 500 mg by mouth daily.   Yes [provider]  valACYclovir (VALTREX) 1000 MG tablet Take 1,000 mg by mouth daily.   Yes [provider]  traMADol (ULTRAM) 50 MG tablet Take 50 mg by mouth every 6 (six) hours as needed for moderate pain.    [provider]    Family History History reviewed. No pertinent family history.  Social History Social History   Tobacco Use   Smoking status: Never   Smokeless tobacco: Never  Vaping Use   Vaping Use: Never used  Substance Use Topics   Alcohol use: No   Drug use: No     Allergies   Penicillins   Review of Systems Review of Systems As noted in HPI    Physical Exam Triage Vital Signs I repeated her pulse ox after deep breaths and was 97% ED Triage Vitals  Enc Vitals Group     BP 02/22/23 0815 122/68     Pulse Rate 02/22/23 0815 61     Resp 02/22/23 0815 18     Temp 02/22/23 0815 98.9 F (37.2 C)     Temp Source 02/22/23 0815 Oral     SpO2 02/22/23 0815 96 %     Weight --      Height --      Head Circumference --      Peak Flow --      Pain Score 02/22/23 0817 9     Pain Loc --      Pain Edu? --      Excl. in GC? --    No data found.  Updated Vital Signs BP 122/68 (BP Location: Left Arm)   Pulse 61   Temp 98.9 F (37.2 C) (Oral)   Resp 18   SpO2 96%   Visual Acuity Right Eye Distance:   Left Eye Distance:   Bilateral Distance:    Right Eye Near:   Left Eye  Near:    Bilateral Near:  Physical Exam Physical Exam Vitals signs and nursing note reviewed.  Constitutional:      General: She is not in acute distress.    Appearance: Normal appearance. She is not ill-appearing, toxic-appearing or diaphoretic.  HENT:     Head: Normocephalic.     Right Ear: Tympanic membrane, ear canal and external ear normal.     Left Ear: Tympanic membrane, ear canal and external ear normal.     Nose: has moderate mucosa congestion with clear mucous    Mouth/Throat: with mild erythema, but uvula is mid line  and no exudate noted.     Mouth: Mucous membranes are moist.  Eyes:     General: No scleral icterus.       Right eye: No discharge.        Left eye: No discharge.     Conjunctiva/sclera: Conjunctivae normal.  Neck:     Musculoskeletal: Neck supple. No neck rigidity.  Cardiovascular:     Rate and Rhythm: Normal rate and regular rhythm.     Heart sounds: No murmur.  Pulmonary:     Effort: Pulmonary effort is normal.     Breath sounds: Normal breath sounds.   Musculoskeletal: Normal range of motion.  Lymphadenopathy:     Cervical: No cervical adenopathy.  Skin:    General: Skin is warm and dry.     Coloration: Skin is not jaundiced.     Findings: No rash.  Neurological:     Mental Status: She is alert and oriented to person, place, and time.     Gait: Gait normal.  Psychiatric:        Mood and Affect: Mood normal.        Behavior: Behavior normal.        Thought Content: Thought content normal.        Judgment: Judgment normal.    UC Treatments / Results  Labs (all labs ordered are listed, but only abnormal results are displayed) Labs Reviewed  SARS CORONAVIRUS 2 (TAT 6-24 HRS)  CULTURE, GROUP A STREP [facility])  POCT RAPID STREP A (OFFICE)  POCT INFLUENZA A/B  Rapid strep is negative  EKG   Radiology No results found.  Procedures Procedures (including critical care time)  Medications Ordered in UC Medications - No data to  display  Initial Impression / Assessment and Plan / UC Course  I have reviewed the triage vital signs and the nursing notes.  Pertinent labs  results that were available during my care of the patient were reviewed by me and considered in my medical decision making (see chart for details).  Flu like illness  Covid and strep test ordered and we will inform her if the results are positive. I Showed her a video how to do nose saline rinses and advised to do them bid for 3-5 days. I placed her on Tessalon Perless and Flonase   Final Clinical Impressions(s) / UC Diagnoses   Final diagnoses:  Viral pharyngitis  Acute upper respiratory infection  Flu-like symptoms     Discharge Instructions      Your exam does not show a bacterial infection, but a virus like a cold virus like rhinovirus, adnovirus, parainfluenza viruses to name a few and antibiotics do not help this.  Do saline nose rinses with [facility] nasal saline rinses twice a day for 3-5 days for the stuffy nose and to cleanse the post nasal drainage from the back of your throat which could be what is causing the irritation  We are sending the second swab for a throat culture and we will call you if the results come back positive.  I am sending Flonase nose spray to help with the stuffy nose and yellow pearl pills for the cough        ED Prescriptions     Medication Sig Dispense Auth. Provider   benzonatate (TESSALON) 200 MG capsule Take 1 capsule (200 mg total) by mouth 3 (three) times daily as needed for cough. 30 capsule Rodriguez-Southworth, Jasenia Weilbacher, PA-C   fluticasone (FLONASE) 50 MCG/ACT nasal spray Place 2 sprays into both nostrils daily. 16 g Rodriguez-Southworth, Nettie Elm, PA-C      PDMP not reviewed this encounter.   Garey Ham, PA-C 02/22/23 [phone]

## 2023-02-23 LAB — SARS CORONAVIRUS 2 (TAT 6-24 HRS): SARS Coronavirus 2: NEGATIVE

## 2023-02-25 ENCOUNTER — Ambulatory Visit
Admission: EM | Admit: 2023-02-25 | Discharge: 2023-02-25 | Disposition: A | Discharge status: Home / Self Care | Payer: Medicare HMO | Attending: Nurse Practitioner | Admitting: Nurse Practitioner

## 2023-02-25 DIAGNOSIS — R059 Cough, unspecified: Secondary | ICD-10-CM | POA: Diagnosis not present

## 2023-02-25 DIAGNOSIS — J0100 Acute maxillary sinusitis, unspecified: Secondary | ICD-10-CM | POA: Diagnosis not present

## 2023-02-25 LAB — CULTURE, GROUP A STREP (THRC)

## 2023-02-25 MED ORDER — GUAIFENESIN 100 MG/5ML PO LIQD: 5.0000 mL | Freq: Four times a day (QID) | ORAL | 0 refills | Status: AC | PRN

## 2023-02-25 MED ORDER — DOXYCYCLINE HYCLATE 100 MG PO TABS: 100.0000 mg | ORAL_TABLET | Freq: Two times a day (BID) | ORAL | 0 refills | Status: AC

## 2023-02-25 NOTE — ED Triage Notes (Signed)
Pt presents with cough  that started a week ago with green mucus, congestion and soreness in chest when cough that started last night. Pt was here Friday and states prescribed medication did not help.

## 2023-02-25 NOTE — Discharge Instructions (Signed)
Take medication as prescribed. Increase fluids and allow for plenty of rest.  You should try to drink at least 8-10 8 ounce glasses of water while symptoms persist to help keep the mucus thin so you can cough it up. Recommend using a humidifier in your bedroom at nighttime during sleep and sleeping elevated, or in a recliner while symptoms persist. Continue use of the fluticasone nasal spray that you are prescribed on 02/22/2023.  Use as prescribed. May use normal saline nasal spray throughout the day to help with nasal congestion or runny nose. As discussed, if you continue to have recurrent sinus symptoms, please speak with your primary care physician to discuss possible referral to ENT. Follow-up with your primary care physician if symptoms do not improve. Follow-up as needed.

## 2023-02-25 NOTE — ED Provider Notes (Signed)
[facility]    CSN: [id number] Arrival date & time: 02/25/23  4098      History   Chief Complaint Chief Complaint  Patient presents with   Cough    HPI Bethany Ray is a 77 y.o. female.   The history is provided by the patient.   The patient presents for follow-up for worsening cough, chest pain with coughing, and increased nasal drainage.  Patient was seen in this clinic on 02/22/2023 and diagnosed with pharyngitis and a viral upper respiratory infection.  Patient states that the medication she was prescribed to include fluticasone and Tessalon did not help her symptoms.  Patient states that she has not had fever, sore throat wheezing, shortness of breath, or difficulty breathing.  Patient states that she also has some pain in her sinuses.  Review of patient's chart shows that she was treated for sinusitis last month.  A COVID test was also conducted during that visit, which was negative.  Past Medical History:  Diagnosis Date   Arthritis    Breast cancer (HCC) 09/14/2013   Cancer (HCC) approx 2006   rt breast/lumpectomy/chemo/rad tx   Diabetes mellitus    High cholesterol    Hypertension    Shingles     Patient Active Problem List   Diagnosis Date Noted   Type 2 diabetes mellitus (HCC) 02/05/2023   Loose stools 11/30/2021   Encounter for colonoscopy in patient with family history of colon cancer 05/25/2019   Chest pain 05/25/2019   Unilateral primary osteoarthritis, right knee 02/27/2017   Unilateral primary osteoarthritis, left knee 02/27/2017   Breast cancer of lower-inner quadrant of right female breast (HCC) 09/14/2013   STRESS FRACTURE-SITE 11/19/2007   ANKLE SPRAIN 11/19/2007    Past Surgical History:  Procedure Laterality Date   ABDOMINAL HYSTERECTOMY     BREAST SURGERY     CHOLECYSTECTOMY     COLONOSCOPY WITH PROPOFOL N/A 04/03/2022   Procedure: COLONOSCOPY WITH PROPOFOL;  Surgeon: Dolores Frame, MD;  Location: [facility];   Service: Gastroenterology;  Laterality: N/A;  1:00, per Soledad Gerlach pt knows to arrive at 9:00   IR RADIOLOGY PERIPHERAL GUIDED IV START  11/18/2019   IR US GUIDE VASC ACCESS RIGHT  11/18/2019   POLYPECTOMY  04/03/2022   Procedure: POLYPECTOMY;  Surgeon: Dolores Frame, MD;  Location: [facility];  Service: Gastroenterology;;    OB History     Gravida      Para      Term      Preterm      AB      Living  2      SAB      IAB      Ectopic      Multiple      Live Births               Home Medications    Prior to Admission medications   Medication Sig Start Date End Date Taking? Authorizing Provider  acetaminophen (TYLENOL) 500 MG tablet Take 500 mg by mouth every 8 (eight) hours as needed for moderate pain.   Yes [provider]  amLODipine (NORVASC) 10 MG tablet Take 10 mg by mouth daily.   Yes [provider]  aspirin EC 81 MG tablet Take 81 mg by mouth daily.   Yes [provider]  B Complex-C (B-COMPLEX WITH VITAMIN C) tablet Take 1 tablet by mouth daily.   Yes [provider]  benzonatate (  TESSALON) 200 MG capsule Take 1 capsule (200 mg total) by mouth 3 (three) times daily as needed for cough. 02/22/23  Yes Rodriguez-Southworth, Nettie Elm, PA-C  cetirizine (ZYRTEC ALLERGY) 10 MG tablet Take 1 tablet (10 mg total) by mouth daily. 01/15/23  Yes Wallis Bamberg, PA-C  doxycycline (VIBRA-TABS) 100 MG tablet Take 1 tablet (100 mg total) by mouth 2 (two) times daily for 10 days. 02/25/23 03/07/23 Yes Eliana Lueth-Warren, Sadie Haber, NP  empagliflozin (JARDIANCE) 10 MG TABS tablet Take 10 mg by mouth daily.   Yes [provider]  fluticasone (FLONASE) 50 MCG/ACT nasal spray Place 2 sprays into both nostrils daily. 02/22/23  Yes Rodriguez-Southworth, Nettie Elm, PA-C  folic acid (FOLVITE) 1 MG tablet Take 1 mg by mouth daily.   Yes [provider]  glimepiride (AMARYL) 1 MG tablet Take 1 mg by mouth daily with breakfast.  06/16/16   Yes [provider]  guaiFENesin (ROBITUSSIN) 100 MG/5ML liquid Take 5 mLs by mouth every 6 (six) hours as needed for up to 10 days for cough or to loosen phlegm. 02/25/23 03/07/23 Yes Lam Mccubbins-Warren, Sadie Haber, NP  hydrochlorothiazide (HYDRODIURIL) 25 MG tablet Take 25 mg by mouth daily.   Yes [provider]  loperamide (IMODIUM A-D) 2 MG tablet Take 2-4 mg by mouth 4 (four) times daily as needed for diarrhea or loose stools.   Yes [provider]  metFORMIN (GLUCOPHAGE-XR) 500 MG 24 hr tablet Take 500 mg by mouth 2 (two) times daily.  08/29/16  Yes [provider]  methotrexate (RHEUMATREX) 2.5 MG tablet Take 10 mg by mouth 2 (two) times a week. Thursdays and Fridays 08/15/16  Yes [provider]  Multiple Vitamin (MULITIVITAMIN WITH MINERALS) TABS Take 1 tablet by mouth daily.   Yes [provider]  olmesartan (BENICAR) 40 MG tablet Take 40 mg by mouth daily. 02/21/22  Yes [provider]  pravastatin (PRAVACHOL) 20 MG tablet Take 20 mg by mouth every evening.    Yes [provider]  spironolactone (ALDACTONE) 25 MG tablet Take 25 mg by mouth daily.   Yes [provider]  Turmeric Curcumin 500 MG CAPS Take 500 mg by mouth daily.   Yes [provider]  valACYclovir (VALTREX) 1000 MG tablet Take 1,000 mg by mouth daily.   Yes [provider]  traMADol (ULTRAM) 50 MG tablet Take 50 mg by mouth every 6 (six) hours as needed for moderate pain.    [provider]    Family History History reviewed. No pertinent family history.  Social History Social History   Tobacco Use   Smoking status: Never   Smokeless tobacco: Never  Vaping Use   Vaping Use: Never used  Substance Use Topics   Alcohol use: No   Drug use: No     Allergies   Penicillins   Review of Systems Review of Systems Per HPI   Physical Exam Triage Vital Signs ED Triage Vitals [02/25/23 0918]  Enc Vitals Group      BP (!) 140/83     Pulse Rate (!) 54     Resp 16     Temp 98.1 F (36.7 C)     Temp Source Oral     SpO2 93 %     Weight      Height      Head Circumference      Peak Flow      Pain Score 8     Pain Loc  Pain Edu?      Excl. in GC?    No data found.  Updated Vital Signs BP (!) 140/83 (BP Location: Right Arm)   Pulse (!) 54   Temp 98.1 F (36.7 C) (Oral)   Resp 16   SpO2 93%   Visual Acuity Right Eye Distance:   Left Eye Distance:   Bilateral Distance:    Right Eye Near:   Left Eye Near:    Bilateral Near:     Physical Exam Vitals and nursing note reviewed.  Constitutional:      General: She is not in acute distress.    Appearance: Normal appearance.  HENT:     Head: Normocephalic.     Right Ear: Tympanic membrane, ear canal and external ear normal.     Left Ear: Tympanic membrane, ear canal and external ear normal.     Nose: Congestion present. No rhinorrhea.     Right Turbinates: Enlarged and swollen.     Left Turbinates: Enlarged and swollen.     Right Sinus: Maxillary sinus tenderness present. No frontal sinus tenderness.     Left Sinus: Maxillary sinus tenderness present. No frontal sinus tenderness.     Mouth/Throat:     Lips: Pink.     Mouth: Mucous membranes are moist.     Pharynx: Oropharynx is clear. Uvula midline. Posterior oropharyngeal erythema present. No pharyngeal swelling, oropharyngeal exudate or uvula swelling.     Comments: Moderate cobblestoning present on posterior oropharynx. Eyes:     Extraocular Movements: Extraocular movements intact.     Conjunctiva/sclera: Conjunctivae normal.     Pupils: Pupils are equal, round, and reactive to light.  Cardiovascular:     Rate and Rhythm: Normal rate and regular rhythm.     Pulses: Normal pulses.     Heart sounds: Normal heart sounds.  Pulmonary:     Effort: Pulmonary effort is normal. No respiratory distress.     Breath sounds: Normal breath sounds. No stridor. No wheezing, rhonchi or  rales.  Abdominal:     General: Bowel sounds are normal.     Palpations: Abdomen is soft.     Tenderness: There is no abdominal tenderness.  Musculoskeletal:     Cervical back: Normal range of motion.  Lymphadenopathy:     Cervical: No cervical adenopathy.  Skin:    General: Skin is warm and dry.  Neurological:     General: No focal deficit present.     Mental Status: She is alert and oriented to person, place, and time.  Psychiatric:        Mood and Affect: Mood normal.        Behavior: Behavior normal.      UC Treatments / Results  Labs (all labs ordered are listed, but only abnormal results are displayed) Labs Reviewed - No data to display  EKG   Radiology No results found.  Procedures Procedures (including critical care time)  Medications Ordered in UC Medications - No data to display  Initial Impression / Assessment and Plan / UC Course  I have reviewed the triage vital signs and the nursing notes.  Pertinent labs & imaging results that were available during my care of the patient were reviewed by me and considered in my medical decision making (see chart for details).  The patient is well-appearing, she is in no acute distress, she is mildly hypertensive, but vital signs are otherwise stable.  Patient presents for worsening nasal drainage, cough, and sinus pressure.  Symptoms  appear to be consistent with acute maxillary sinusitis.  Patient was treated for sinusitis last month.  Will treat patient again with doxycycline 100 mg, and for her cough, Robitussin 100 mg was prescribed.  Patient was advised to continue use of fluticasone that she was prescribed.  Supportive care recommendations were provided and discussed with the patient to include increasing her fluid intake, use of over-the-counter analgesics for pain or discomfort, and use of a humidifier in her bedroom at nighttime during sleep or sleeping elevated.  Patient advised to speak with her PCP to discuss  possible ENT referral if she continues to have recurrent sinus symptoms.  Patient advised to follow-up with her PCP if symptoms fail to improve after this treatment.  Patient is in agreement with this plan of care and verbalizes understanding.  All questions were answered.  Patient stable for discharge.   Final Clinical Impressions(s) / UC Diagnoses   Final diagnoses:  Acute maxillary sinusitis, recurrence not specified  Cough, unspecified type     Discharge Instructions      Take medication as prescribed. Increase fluids and allow for plenty of rest.  You should try to drink at least 8-10 8 ounce glasses of water while symptoms persist to help keep the mucus thin so you can cough it up. Recommend using a humidifier in your bedroom at nighttime during sleep and sleeping elevated, or in a recliner while symptoms persist. Continue use of the fluticasone nasal spray that you are prescribed on 02/22/2023.  Use as prescribed. May use normal saline nasal spray throughout the day to help with nasal congestion or runny nose. As discussed, if you continue to have recurrent sinus symptoms, please speak with your primary care physician to discuss possible referral to ENT. Follow-up with your primary care physician if symptoms do not improve. Follow-up as needed.     ED Prescriptions     Medication Sig Dispense Auth. Provider   doxycycline (VIBRA-TABS) 100 MG tablet Take 1 tablet (100 mg total) by mouth 2 (two) times daily for 10 days. 20 tablet Krist Rosenboom-Warren, Sadie Haber, NP   guaiFENesin (ROBITUSSIN) 100 MG/5ML liquid Take 5 mLs by mouth every 6 (six) hours as needed for up to 10 days for cough or to loosen phlegm. 200 mL Teiara Baria-Warren, Sadie Haber, NP      PDMP not reviewed this encounter.   Abran Cantor, NP 02/25/23 1001

## 2023-03-07 DIAGNOSIS — E1129 Type 2 diabetes mellitus with other diabetic kidney complication: Secondary | ICD-10-CM | POA: Diagnosis not present

## 2023-03-07 DIAGNOSIS — M069 Rheumatoid arthritis, unspecified: Secondary | ICD-10-CM | POA: Diagnosis not present

## 2023-03-07 DIAGNOSIS — E785 Hyperlipidemia, unspecified: Secondary | ICD-10-CM | POA: Diagnosis not present

## 2023-03-07 DIAGNOSIS — I10 Essential (primary) hypertension: Secondary | ICD-10-CM | POA: Diagnosis not present

## 2023-03-07 DIAGNOSIS — Z79899 Other long term (current) drug therapy: Secondary | ICD-10-CM | POA: Diagnosis not present

## 2023-03-14 DIAGNOSIS — I10 Essential (primary) hypertension: Secondary | ICD-10-CM | POA: Diagnosis not present

## 2023-03-14 DIAGNOSIS — M069 Rheumatoid arthritis, unspecified: Secondary | ICD-10-CM | POA: Diagnosis not present

## 2023-03-14 DIAGNOSIS — E119 Type 2 diabetes mellitus without complications: Secondary | ICD-10-CM | POA: Diagnosis not present

## 2023-04-03 DIAGNOSIS — S0501XA Injury of conjunctiva and corneal abrasion without foreign body, right eye, initial encounter: Secondary | ICD-10-CM | POA: Diagnosis not present

## 2023-04-03 DIAGNOSIS — B0052 Herpesviral keratitis: Secondary | ICD-10-CM | POA: Diagnosis not present

## 2023-04-05 DIAGNOSIS — H16201 Unspecified keratoconjunctivitis, right eye: Secondary | ICD-10-CM | POA: Diagnosis not present

## 2023-04-05 DIAGNOSIS — B0052 Herpesviral keratitis: Secondary | ICD-10-CM | POA: Diagnosis not present

## 2023-04-09 DIAGNOSIS — H16201 Unspecified keratoconjunctivitis, right eye: Secondary | ICD-10-CM | POA: Diagnosis not present

## 2023-04-09 DIAGNOSIS — B0052 Herpesviral keratitis: Secondary | ICD-10-CM | POA: Diagnosis not present

## 2023-04-17 DIAGNOSIS — H16201 Unspecified keratoconjunctivitis, right eye: Secondary | ICD-10-CM | POA: Diagnosis not present

## 2023-04-17 DIAGNOSIS — B0052 Herpesviral keratitis: Secondary | ICD-10-CM | POA: Diagnosis not present

## 2023-04-26 DIAGNOSIS — E1169 Type 2 diabetes mellitus with other specified complication: Secondary | ICD-10-CM | POA: Diagnosis not present

## 2023-04-26 DIAGNOSIS — M25562 Pain in left knee: Secondary | ICD-10-CM | POA: Diagnosis not present

## 2023-04-26 DIAGNOSIS — Z780 Asymptomatic menopausal state: Secondary | ICD-10-CM | POA: Diagnosis not present

## 2023-04-26 DIAGNOSIS — M8589 Other specified disorders of bone density and structure, multiple sites: Secondary | ICD-10-CM | POA: Diagnosis not present

## 2023-04-26 DIAGNOSIS — M0579 Rheumatoid arthritis with rheumatoid factor of multiple sites without organ or systems involvement: Secondary | ICD-10-CM | POA: Diagnosis not present

## 2023-04-26 DIAGNOSIS — Z13820 Encounter for screening for osteoporosis: Secondary | ICD-10-CM | POA: Diagnosis not present

## 2023-04-26 DIAGNOSIS — M170 Bilateral primary osteoarthritis of knee: Secondary | ICD-10-CM | POA: Diagnosis not present

## 2023-04-26 DIAGNOSIS — E785 Hyperlipidemia, unspecified: Secondary | ICD-10-CM | POA: Diagnosis not present

## 2023-04-26 DIAGNOSIS — Z79899 Other long term (current) drug therapy: Secondary | ICD-10-CM | POA: Diagnosis not present

## 2023-05-07 ENCOUNTER — Ambulatory Visit: Payer: Medicare HMO | Admitting: Podiatry

## 2023-05-07 ENCOUNTER — Encounter: Payer: Self-pay | Admitting: Podiatry

## 2023-05-07 DIAGNOSIS — M79674 Pain in right toe(s): Secondary | ICD-10-CM | POA: Diagnosis not present

## 2023-05-07 DIAGNOSIS — M79675 Pain in left toe(s): Secondary | ICD-10-CM

## 2023-05-07 DIAGNOSIS — B351 Tinea unguium: Secondary | ICD-10-CM | POA: Diagnosis not present

## 2023-05-07 DIAGNOSIS — E119 Type 2 diabetes mellitus without complications: Secondary | ICD-10-CM

## 2023-05-07 NOTE — Progress Notes (Signed)
  Subjective:  Patient ID: Bethany Ray, female    DOB: 02-24-1946,   MRN: [id number]  Chief Complaint  Patient presents with   Nail Problem    [facility]    77 y.o. female presents for concern of thickened elongated and painful nails that are difficult to trim. Requesting to have them trimmed today. Relates burning and tingling in their feet. Patient is diabetic and last A1c was No results found for: "HGBA1C" .   PCP:  Carylon Perches, MD    . Denies any other pedal complaints. Denies n/v/f/c.   Past Medical History:  Diagnosis Date   Arthritis    Breast cancer (HCC) 09/14/2013   Cancer (HCC) approx 2006   rt breast/lumpectomy/chemo/rad tx   Diabetes mellitus    High cholesterol    Hypertension    Shingles     Objective:  Physical Exam: Vascular: DP/PT pulses 2/4 bilateral. CFT <3 seconds. Absent hair growth on digits. Edema noted to bilateral lower extremities. Xerosis noted bilaterally.  Skin. No lacerations or abrasions bilateral feet. Nails 1-5 bilateral  are thickened discolored and elongated with subungual debris.  Musculoskeletal: MMT 5/5 bilateral lower extremities in DF, PF, Inversion and Eversion. Deceased ROM in DF of ankle joint.  Neurological: Sensation intact to light touch. Pro    Assessment:   1. Pain due to onychomycosis of toenails of both feet   2. Type 2 diabetes mellitus without complication, without long-term current use of insulin (HCC)   3. Encounter for diabetic foot exam (HCC)       Plan:  Patient was evaluated and treated and all questions answered. -Discussed and educated patient on diabetic foot care, especially with  regards to the vascular, neurological and musculoskeletal systems.  -Stressed the importance of good glycemic control and the detriment of not  controlling glucose levels in relation to the foot. -Discussed supportive shoes at all times and checking feet regularly.  -Mechanically debrided all nails 1-5 bilateral using sterile nail  nipper and filed with dremel without incident  -Answered all patient questions -Patient to return  in 3 months for at risk foot care -Patient advised to call the office if any problems or questions arise in the meantime.   [name], DPM

## 2023-06-13 DIAGNOSIS — E1129 Type 2 diabetes mellitus with other diabetic kidney complication: Secondary | ICD-10-CM | POA: Diagnosis not present

## 2023-06-20 DIAGNOSIS — M069 Rheumatoid arthritis, unspecified: Secondary | ICD-10-CM | POA: Diagnosis not present

## 2023-06-20 DIAGNOSIS — I10 Essential (primary) hypertension: Secondary | ICD-10-CM | POA: Diagnosis not present

## 2023-06-20 DIAGNOSIS — E119 Type 2 diabetes mellitus without complications: Secondary | ICD-10-CM | POA: Diagnosis not present

## 2023-06-20 DIAGNOSIS — Z23 Encounter for immunization: Secondary | ICD-10-CM | POA: Diagnosis not present

## 2023-07-02 DIAGNOSIS — Z1231 Encounter for screening mammogram for malignant neoplasm of breast: Secondary | ICD-10-CM | POA: Diagnosis not present

## 2023-07-08 DIAGNOSIS — B0052 Herpesviral keratitis: Secondary | ICD-10-CM | POA: Diagnosis not present

## 2023-07-08 DIAGNOSIS — Z961 Presence of intraocular lens: Secondary | ICD-10-CM | POA: Diagnosis not present

## 2023-07-08 DIAGNOSIS — H401111 Primary open-angle glaucoma, right eye, mild stage: Secondary | ICD-10-CM | POA: Diagnosis not present

## 2023-07-08 DIAGNOSIS — N6321 Unspecified lump in the left breast, upper outer quadrant: Secondary | ICD-10-CM | POA: Diagnosis not present

## 2023-07-08 DIAGNOSIS — R922 Inconclusive mammogram: Secondary | ICD-10-CM | POA: Diagnosis not present

## 2023-07-19 ENCOUNTER — Other Ambulatory Visit: Payer: Self-pay

## 2023-07-19 DIAGNOSIS — N6489 Other specified disorders of breast: Secondary | ICD-10-CM | POA: Diagnosis not present

## 2023-07-19 DIAGNOSIS — R928 Other abnormal and inconclusive findings on diagnostic imaging of breast: Secondary | ICD-10-CM | POA: Diagnosis not present

## 2023-07-22 DIAGNOSIS — B0052 Herpesviral keratitis: Secondary | ICD-10-CM | POA: Diagnosis not present

## 2023-07-22 LAB — SURGICAL PATHOLOGY

## 2023-08-07 ENCOUNTER — Ambulatory Visit: Payer: Medicare HMO | Admitting: Podiatry

## 2023-08-07 ENCOUNTER — Encounter: Payer: Self-pay | Admitting: Podiatry

## 2023-08-07 VITALS — Ht 60.0 in | Wt 175.0 lb

## 2023-08-07 DIAGNOSIS — M79675 Pain in left toe(s): Secondary | ICD-10-CM

## 2023-08-07 DIAGNOSIS — E119 Type 2 diabetes mellitus without complications: Secondary | ICD-10-CM | POA: Diagnosis not present

## 2023-08-07 DIAGNOSIS — B351 Tinea unguium: Secondary | ICD-10-CM

## 2023-08-07 DIAGNOSIS — M79674 Pain in right toe(s): Secondary | ICD-10-CM

## 2023-08-07 NOTE — Progress Notes (Signed)
Subjective:  Patient ID: Bethany Ray, female    DOB: [birth year],   MRN: [id number]  Chief Complaint  Patient presents with   Nail Problem    [facility]    77 y.o. female presents for concern of thickened elongated and painful nails that are difficult to trim. Requesting to have them trimmed today. Relates burning and tingling in their feet. Patient is diabetic and last A1c was No results found for: "HGBA1C" .   PCP:  Carylon Perches, MD    . Denies any other pedal complaints. Denies n/v/f/c.   Past Medical History:  Diagnosis Date   Arthritis    Breast cancer (HCC) 09/14/2013   Cancer (HCC) approx 2006   rt breast/lumpectomy/chemo/rad tx   Diabetes mellitus    High cholesterol    Hypertension    Shingles     Objective:  Physical Exam: Vascular: DP/PT pulses 2/4 bilateral. CFT <3 seconds. Absent hair growth on digits. Edema noted to bilateral lower extremities. Xerosis noted bilaterally.  Skin. No lacerations or abrasions bilateral feet. Nails 1-5 bilateral  are thickened discolored and elongated with subungual debris. Lesion on dorsum of skin on right foot with darkening even coloration mild changes in the border.  Musculoskeletal: MMT 5/5 bilateral lower extremities in DF, PF, Inversion and Eversion. Deceased ROM in DF of ankle joint.  Neurological: Sensation intact to light touch. Protective sensation diminished.    Assessment:   1. Pain due to onychomycosis of toenails of both feet   2. Type 2 diabetes mellitus without complication, without long-term current use of insulin (HCC)       Plan:  Patient was evaluated and treated and all questions answered. -Discussed and educated patient on diabetic foot care, especially with  regards to the vascular, neurological and musculoskeletal systems.  -Stressed the importance of good glycemic control and the detriment of not  controlling glucose levels in relation to the foot. -Discussed supportive shoes at all times and checking feet  regularly.  -Mechanically debrided all nails 1-5 bilateral using sterile nail nipper and filed with dremel without incident  -Answered all patient questions -Advised to keep an eye on lesion on top of her foot and if any changes to return for biopsy.  -Patient to return  in 3 months for at risk foot care -Patient advised to call the office if any problems or questions arise in the meantime.   [name], DPM

## 2023-08-12 DIAGNOSIS — B0052 Herpesviral keratitis: Secondary | ICD-10-CM | POA: Diagnosis not present

## 2023-09-12 DIAGNOSIS — M25561 Pain in right knee: Secondary | ICD-10-CM | POA: Diagnosis not present

## 2023-09-12 DIAGNOSIS — M170 Bilateral primary osteoarthritis of knee: Secondary | ICD-10-CM | POA: Diagnosis not present

## 2023-09-12 DIAGNOSIS — E1169 Type 2 diabetes mellitus with other specified complication: Secondary | ICD-10-CM | POA: Diagnosis not present

## 2023-09-12 DIAGNOSIS — Z79899 Other long term (current) drug therapy: Secondary | ICD-10-CM | POA: Diagnosis not present

## 2023-09-12 DIAGNOSIS — E785 Hyperlipidemia, unspecified: Secondary | ICD-10-CM | POA: Diagnosis not present

## 2023-09-12 DIAGNOSIS — M0579 Rheumatoid arthritis with rheumatoid factor of multiple sites without organ or systems involvement: Secondary | ICD-10-CM | POA: Diagnosis not present

## 2023-09-17 DIAGNOSIS — E1129 Type 2 diabetes mellitus with other diabetic kidney complication: Secondary | ICD-10-CM | POA: Diagnosis not present

## 2023-09-24 DIAGNOSIS — M069 Rheumatoid arthritis, unspecified: Secondary | ICD-10-CM | POA: Diagnosis not present

## 2023-09-24 DIAGNOSIS — I10 Essential (primary) hypertension: Secondary | ICD-10-CM | POA: Diagnosis not present

## 2023-09-24 DIAGNOSIS — E1122 Type 2 diabetes mellitus with diabetic chronic kidney disease: Secondary | ICD-10-CM | POA: Diagnosis not present

## 2023-11-11 ENCOUNTER — Ambulatory Visit (INDEPENDENT_AMBULATORY_CARE_PROVIDER_SITE_OTHER): Payer: Medicare HMO | Admitting: Podiatry

## 2023-11-11 DIAGNOSIS — Z91199 Patient's noncompliance with other medical treatment and regimen due to unspecified reason: Secondary | ICD-10-CM

## 2023-11-11 NOTE — Progress Notes (Signed)
 No show

## 2023-11-14 ENCOUNTER — Ambulatory Visit
Admission: EM | Admit: 2023-11-14 | Discharge: 2023-11-14 | Disposition: A | Discharge status: Home / Self Care | Payer: Medicare HMO | Attending: Nurse Practitioner | Admitting: Nurse Practitioner

## 2023-11-14 ENCOUNTER — Encounter: Payer: Self-pay | Admitting: Emergency Medicine

## 2023-11-14 DIAGNOSIS — M25561 Pain in right knee: Secondary | ICD-10-CM | POA: Diagnosis not present

## 2023-11-14 MED ORDER — PREDNISONE 20 MG PO TABS: ORAL_TABLET | ORAL | 0 refills | Status: DC

## 2023-11-14 NOTE — ED Triage Notes (Signed)
 Right knee pain x 2 days.  States it hurts to bend knee.

## 2023-11-14 NOTE — Discharge Instructions (Signed)
 I am concerned that you may be having a gout attack of your right knee.  Take the prednisone  as prescribed to treat it.  You can continue Tylenol  and Voltargen gel as needed for pain.  Follow up with Orthopedist if symptoms do not improve with treatment.

## 2023-11-14 NOTE — ED Provider Notes (Signed)
 [facility]    CSN: [id number] Arrival date & time: 11/14/23  1229      History   Chief Complaint No chief complaint on file.   HPI Bethany Ray is a 78 y.o. female.   Patient presents today for 1 day history of right knee pain.  She denies recent fall, trauma, or known injury to the right knee.  She did fall last week but was able to get up and walk immediately after and did not cut or scrape her legs or knee.  She reports the pain started all of a sudden yesterday.  The pain has been making it difficult to walk or bend the knee.  She feels that the knee is swollen, however denies redness or warmth.  No numbness or tingling in the right leg.  Has been taking Tylenol  for the pain and using topical Voltaren  gel without much relief.   Patient reports history of osteoarthritis to both knees, states the pain has never felt this bad.  Denies history of gout.     Past Medical History:  Diagnosis Date   Arthritis    Breast cancer (HCC) 09/14/2013   Cancer (HCC) approx 2006   rt breast/lumpectomy/chemo/rad tx   Diabetes mellitus    High cholesterol    Hypertension    Shingles     Patient Active Problem List   Diagnosis Date Noted   Type 2 diabetes mellitus (HCC) 02/05/2023   Loose stools 11/30/2021   Encounter for colonoscopy in patient with family history of colon cancer 05/25/2019   Chest pain 05/25/2019   Unilateral primary osteoarthritis, right knee 02/27/2017   Unilateral primary osteoarthritis, left knee 02/27/2017   Breast cancer of lower-inner quadrant of right female breast (HCC) 09/14/2013   STRESS FRACTURE-SITE 11/19/2007   Sprain of ankle 11/19/2007    Past Surgical History:  Procedure Laterality Date   ABDOMINAL HYSTERECTOMY     BREAST SURGERY     CHOLECYSTECTOMY     COLONOSCOPY WITH PROPOFOL  N/A 04/03/2022   Procedure: COLONOSCOPY WITH PROPOFOL ;  Surgeon: Eartha Angelia Sieving, MD;  Location: [facility];  Service: Gastroenterology;   Laterality: N/A;  1:00, per Anette Caldron pt knows to arrive at 9:00   IR RADIOLOGY PERIPHERAL GUIDED IV START  11/18/2019   IR US  GUIDE VASC ACCESS RIGHT  11/18/2019   POLYPECTOMY  04/03/2022   Procedure: POLYPECTOMY;  Surgeon: Eartha Angelia Sieving, MD;  Location: [facility];  Service: Gastroenterology;;    OB History     Gravida      Para      Term      Preterm      AB      Living  2      SAB      IAB      Ectopic      Multiple      Live Births               Home Medications    Prior to Admission medications   Medication Sig Start Date End Date Taking? Authorizing Provider  predniSONE  (DELTASONE ) 20 MG tablet Take 3 tablets (60mg ) on days 1-2. Take 2 tablets (40mg ) on days 3-4. Take 1 tablet (20mg ) on days 5-6, then stop. 11/14/23  Yes Chandra Harlene LABOR, NP  acetaminophen  (TYLENOL ) 500 MG tablet Take 500 mg by mouth every 8 (eight) hours as needed for moderate pain.    [provider]  amLODipine  (NORVASC ) 10 MG tablet Take  10 mg by mouth daily.    [provider]  aspirin  EC 81 MG tablet Take 81 mg by mouth daily.    [provider]  B Complex-C (B-COMPLEX WITH VITAMIN C) tablet Take 1 tablet by mouth daily.    [provider]  benzonatate  (TESSALON ) 200 MG capsule Take 1 capsule (200 mg total) by mouth 3 (three) times daily as needed for cough. 02/22/23   Rodriguez-Southworth, Sylvia, PA-C  cetirizine  (ZYRTEC  ALLERGY) 10 MG tablet Take 1 tablet (10 mg total) by mouth daily. 01/15/23   Christopher Savannah, PA-C  empagliflozin (JARDIANCE) 10 MG TABS tablet Take 10 mg by mouth daily.    [provider]  fluticasone  (FLONASE ) 50 MCG/ACT nasal spray Place 2 sprays into both nostrils daily. 02/22/23   Rodriguez-Southworth, Sylvia, PA-C  folic acid  (FOLVITE ) 1 MG tablet Take 1 mg by mouth daily.    [provider]  glimepiride  (AMARYL ) 1 MG tablet Take 1 mg by mouth daily with breakfast.  06/16/16   [provider]   hydrochlorothiazide  (HYDRODIURIL ) 25 MG tablet Take 25 mg by mouth daily.    [provider]  loperamide (IMODIUM A-D) 2 MG tablet Take 2-4 mg by mouth 4 (four) times daily as needed for diarrhea or loose stools.    [provider]  metFORMIN  (GLUCOPHAGE -XR) 500 MG 24 hr tablet Take 500 mg by mouth 2 (two) times daily.  08/29/16   [provider]  methotrexate (RHEUMATREX) 2.5 MG tablet Take 10 mg by mouth 2 (two) times a week. Thursdays and Fridays 08/15/16   [provider]  Multiple Vitamin (MULITIVITAMIN WITH MINERALS) TABS Take 1 tablet by mouth daily.    [provider]  olmesartan  (BENICAR ) 40 MG tablet Take 40 mg by mouth daily. 02/21/22   [provider]  pravastatin  (PRAVACHOL ) 20 MG tablet Take 20 mg by mouth every evening.     [provider]  spironolactone  (ALDACTONE ) 25 MG tablet Take 25 mg by mouth daily.    [provider]  traMADol  (ULTRAM ) 50 MG tablet Take 50 mg by mouth every 6 (six) hours as needed for moderate pain.    [provider]  Turmeric Curcumin 500 MG CAPS Take 500 mg by mouth daily.    [provider]  valACYclovir (VALTREX) 1000 MG tablet Take 1,000 mg by mouth daily.    [provider]    Family History History reviewed. No pertinent family history.  Social History Social History   Tobacco Use   Smoking status: Never   Smokeless tobacco: Never  Vaping Use   Vaping status: Never Used  Substance Use Topics   Alcohol use: No   Drug use: No     Allergies   Penicillins   Review of Systems Review of Systems Per HPI  Physical Exam Triage Vital Signs ED Triage Vitals  Encounter Vitals Group     BP 11/14/23 1247 (!) 172/71     Systolic BP Percentile --      Diastolic BP Percentile --      Pulse Rate 11/14/23 1247 78     Resp 11/14/23 1247 18     Temp 11/14/23 1247 98.7 F (37.1 C)     Temp Source 11/14/23 1247 Oral     SpO2 11/14/23 1247 95 %      Weight --      Height --      Head Circumference --      Peak Flow --  Pain Score 11/14/23 1248 8     Pain Loc --      Pain Education --      Exclude from Growth Chart --    No data found.  Updated Vital Signs BP (!) 172/71 (BP Location: Left Arm)   Pulse 78   Temp 98.7 F (37.1 C) (Oral)   Resp 18   SpO2 95%   Visual Acuity Right Eye Distance:   Left Eye Distance:   Bilateral Distance:    Right Eye Near:   Left Eye Near:    Bilateral Near:     Physical Exam Vitals and nursing note reviewed.  Constitutional:      General: She is not in acute distress.    Appearance: Normal appearance. She is not toxic-appearing.  HENT:     Mouth/Throat:     Mouth: Mucous membranes are moist.     Pharynx: Oropharynx is clear.  Pulmonary:     Effort: Pulmonary effort is normal. No respiratory distress.  Musculoskeletal:     Comments: Inspection: mild swelling right knee diffusely; no bruising, obvious deformity or redness Palpation: exquistely tender to palpation right knee diffusely; there is pain with very light palpation of the knee; no obvious deformities palpated; right knee feels slightly warmer than left ROM: Unable to assess secondary to pain Strength: unable to assess secondary to pain Neurovascular: neurovascularly intact in distal right lower extremity   Skin:    General: Skin is warm and dry.     Capillary Refill: Capillary refill takes less than 2 seconds.     Coloration: Skin is not jaundiced or pale.     Findings: No erythema.  Neurological:     Mental Status: She is alert and oriented to person, place, and time.  Psychiatric:        Behavior: Behavior is cooperative.      UC Treatments / Results  Labs (all labs ordered are listed, but only abnormal results are displayed) Labs Reviewed - No data to display  EKG   Radiology No results found.  Procedures Procedures (including critical care time)  Medications Ordered in UC Medications - No  data to display  Initial Impression / Assessment and Plan / UC Course  I have reviewed the triage vital signs and the nursing notes.  Pertinent labs & imaging results that were available during my care of the patient were reviewed by me and considered in my medical decision making (see chart for details).   Patient is hypertensive in triage today, otherwise vital signs are stable.  1. Acute pain of right knee Query possible gout attack Treat with oral prednisone  Other supportive care discussed including continue Tylenol , Voltaren  gel as needed Recommended follow up with PCP/Ortho if symptoms do not improve with treatment   The patient was given the opportunity to ask questions.  All questions answered to their satisfaction.  The patient is in agreement to this plan.    Final Clinical Impressions(s) / UC Diagnoses   Final diagnoses:  Acute pain of right knee     Discharge Instructions      I am concerned that you may be having a gout attack of your right knee.  Take the prednisone  as prescribed to treat it.  You can continue Tylenol  and Voltargen gel as needed for pain.  Follow up with Orthopedist if symptoms do not improve with treatment.    ED Prescriptions     Medication Sig Dispense Auth. Provider   predniSONE  (DELTASONE ) 20 MG  tablet Take 3 tablets (60mg ) on days 1-2. Take 2 tablets (40mg ) on days 3-4. Take 1 tablet (20mg ) on days 5-6, then stop. 12 tablet Chandra Harlene LABOR, NP      PDMP not reviewed this encounter.   Chandra Harlene LABOR, NP 11/14/23 [id number]

## 2023-12-16 DIAGNOSIS — E785 Hyperlipidemia, unspecified: Secondary | ICD-10-CM | POA: Diagnosis not present

## 2023-12-16 DIAGNOSIS — M0579 Rheumatoid arthritis with rheumatoid factor of multiple sites without organ or systems involvement: Secondary | ICD-10-CM | POA: Diagnosis not present

## 2023-12-16 DIAGNOSIS — E1169 Type 2 diabetes mellitus with other specified complication: Secondary | ICD-10-CM | POA: Diagnosis not present

## 2023-12-16 DIAGNOSIS — M25562 Pain in left knee: Secondary | ICD-10-CM | POA: Diagnosis not present

## 2023-12-16 DIAGNOSIS — M170 Bilateral primary osteoarthritis of knee: Secondary | ICD-10-CM | POA: Diagnosis not present

## 2023-12-16 DIAGNOSIS — Z79899 Other long term (current) drug therapy: Secondary | ICD-10-CM | POA: Diagnosis not present

## 2023-12-18 DIAGNOSIS — E785 Hyperlipidemia, unspecified: Secondary | ICD-10-CM | POA: Diagnosis not present

## 2023-12-18 DIAGNOSIS — M0600 Rheumatoid arthritis without rheumatoid factor, unspecified site: Secondary | ICD-10-CM | POA: Diagnosis not present

## 2023-12-18 DIAGNOSIS — I10 Essential (primary) hypertension: Secondary | ICD-10-CM | POA: Diagnosis not present

## 2023-12-18 DIAGNOSIS — E1129 Type 2 diabetes mellitus with other diabetic kidney complication: Secondary | ICD-10-CM | POA: Diagnosis not present

## 2023-12-25 DIAGNOSIS — M069 Rheumatoid arthritis, unspecified: Secondary | ICD-10-CM | POA: Diagnosis not present

## 2023-12-25 DIAGNOSIS — R809 Proteinuria, unspecified: Secondary | ICD-10-CM | POA: Diagnosis not present

## 2023-12-25 DIAGNOSIS — E1122 Type 2 diabetes mellitus with diabetic chronic kidney disease: Secondary | ICD-10-CM | POA: Diagnosis not present

## 2023-12-25 DIAGNOSIS — I10 Essential (primary) hypertension: Secondary | ICD-10-CM | POA: Diagnosis not present

## 2024-01-13 ENCOUNTER — Ambulatory Visit: Admitting: Podiatry

## 2024-01-13 ENCOUNTER — Encounter: Payer: Self-pay | Admitting: Podiatry

## 2024-01-13 DIAGNOSIS — M79674 Pain in right toe(s): Secondary | ICD-10-CM

## 2024-01-13 DIAGNOSIS — E119 Type 2 diabetes mellitus without complications: Secondary | ICD-10-CM

## 2024-01-13 DIAGNOSIS — M79675 Pain in left toe(s): Secondary | ICD-10-CM | POA: Diagnosis not present

## 2024-01-13 DIAGNOSIS — B351 Tinea unguium: Secondary | ICD-10-CM | POA: Diagnosis not present

## 2024-01-13 NOTE — Progress Notes (Signed)
 This patient presents to the office with chief complaint of long thick painful nails.  Patient says the nails are painful walking and wearing shoes.  This patient is unable to self treat.  This patient is unable to trim her nails since she is unable to reach her nails.  She presents to the office for preventative foot care services.  General Appearance  Alert, conversant and in no acute stress.  Vascular  Dorsalis pedis are palpable  bilaterally. Posterior tibial pulses are absent due to ankle swelling. Capillary return is within normal limits  bilaterally. Temperature is within normal limits  bilaterally.  Neurologic  Senn-Weinstein monofilament wire test within normal limits  bilaterally. Muscle power within normal limits bilaterally.  Nails Thick disfigured discolored nails with subungual debris  from hallux to fifth toes bilaterally. No evidence of bacterial infection or drainage bilaterally.  Orthopedic  No limitations of motion  feet .  No crepitus or effusions noted.  No bony pathology or digital deformities noted. HAV  B/L.  Skin  normotropic skin with no porokeratosis noted bilaterally.  No signs of infections or ulcers noted.     Onychomycosis  Nails  B/L.  Pain in right toes  Pain in left toes  Debridement of nails both feet followed trimming the nails with dremel tool.    RTC 3 months.   Helane Gunther DPM

## 2024-02-04 DIAGNOSIS — H1820 Unspecified corneal edema: Secondary | ICD-10-CM | POA: Diagnosis not present

## 2024-02-04 DIAGNOSIS — H20021 Recurrent acute iridocyclitis, right eye: Secondary | ICD-10-CM | POA: Diagnosis not present

## 2024-02-04 DIAGNOSIS — B0052 Herpesviral keratitis: Secondary | ICD-10-CM | POA: Diagnosis not present

## 2024-02-06 DIAGNOSIS — H1789 Other corneal scars and opacities: Secondary | ICD-10-CM | POA: Diagnosis not present

## 2024-02-06 DIAGNOSIS — H15101 Unspecified episcleritis, right eye: Secondary | ICD-10-CM | POA: Diagnosis not present

## 2024-02-06 DIAGNOSIS — H20021 Recurrent acute iridocyclitis, right eye: Secondary | ICD-10-CM | POA: Diagnosis not present

## 2024-02-19 DIAGNOSIS — B0239 Other herpes zoster eye disease: Secondary | ICD-10-CM | POA: Diagnosis not present

## 2024-02-19 DIAGNOSIS — H2011 Chronic iridocyclitis, right eye: Secondary | ICD-10-CM | POA: Diagnosis not present

## 2024-03-17 DIAGNOSIS — E1169 Type 2 diabetes mellitus with other specified complication: Secondary | ICD-10-CM | POA: Diagnosis not present

## 2024-03-17 DIAGNOSIS — Z13820 Encounter for screening for osteoporosis: Secondary | ICD-10-CM | POA: Diagnosis not present

## 2024-03-17 DIAGNOSIS — E785 Hyperlipidemia, unspecified: Secondary | ICD-10-CM | POA: Diagnosis not present

## 2024-03-17 DIAGNOSIS — M170 Bilateral primary osteoarthritis of knee: Secondary | ICD-10-CM | POA: Diagnosis not present

## 2024-03-17 DIAGNOSIS — M25569 Pain in unspecified knee: Secondary | ICD-10-CM | POA: Diagnosis not present

## 2024-03-17 DIAGNOSIS — Z79899 Other long term (current) drug therapy: Secondary | ICD-10-CM | POA: Diagnosis not present

## 2024-03-17 DIAGNOSIS — M0579 Rheumatoid arthritis with rheumatoid factor of multiple sites without organ or systems involvement: Secondary | ICD-10-CM | POA: Diagnosis not present

## 2024-03-24 DIAGNOSIS — E1129 Type 2 diabetes mellitus with other diabetic kidney complication: Secondary | ICD-10-CM | POA: Diagnosis not present

## 2024-03-30 DIAGNOSIS — M069 Rheumatoid arthritis, unspecified: Secondary | ICD-10-CM | POA: Diagnosis not present

## 2024-03-30 DIAGNOSIS — E1122 Type 2 diabetes mellitus with diabetic chronic kidney disease: Secondary | ICD-10-CM | POA: Diagnosis not present

## 2024-03-30 DIAGNOSIS — I10 Essential (primary) hypertension: Secondary | ICD-10-CM | POA: Diagnosis not present

## 2024-05-11 ENCOUNTER — Encounter: Payer: Self-pay | Admitting: Podiatry

## 2024-05-11 ENCOUNTER — Ambulatory Visit: Admitting: Podiatry

## 2024-05-11 DIAGNOSIS — B351 Tinea unguium: Secondary | ICD-10-CM | POA: Diagnosis not present

## 2024-05-11 DIAGNOSIS — M79675 Pain in left toe(s): Secondary | ICD-10-CM | POA: Diagnosis not present

## 2024-05-11 DIAGNOSIS — M778 Other enthesopathies, not elsewhere classified: Secondary | ICD-10-CM

## 2024-05-11 DIAGNOSIS — M79674 Pain in right toe(s): Secondary | ICD-10-CM | POA: Diagnosis not present

## 2024-05-11 DIAGNOSIS — E119 Type 2 diabetes mellitus without complications: Secondary | ICD-10-CM

## 2024-05-11 NOTE — Progress Notes (Addendum)
 This patient presents to the office with chief complaint of long thick painful nails.  Patient says the nails are painful walking and wearing shoes.  This patient is unable to self treat.  This patient is unable to trim her nails since she is unable to reach her nails.  She presents to the office for preventative foot care services.  General Appearance  Alert, conversant and in no acute stress.  Vascular  Dorsalis pedis are palpable  bilaterally. Posterior tibial pulses are absent due to ankle swelling. Capillary return is within normal limits  bilaterally. Temperature is within normal limits  bilaterally.  Neurologic  Senn-Weinstein monofilament wire test within normal limits  bilaterally. Muscle power within normal limits bilaterally.  Nails Thick disfigured discolored nails with subungual debris  from hallux to fifth toes bilaterally. No evidence of bacterial infection or drainage bilaterally.  Orthopedic  No limitations of motion  feet .  No crepitus or effusions noted.  No bony pathology or digital deformities noted. HAV  B/L. Pain under metatarsal heads left foot.  Skin  normotropic skin with no porokeratosis noted bilaterally.  No signs of infections or ulcers noted.     Onychomycosis  Nails  B/L.  Pain in right toes  Pain in left toes  Capsulitis left foot.  Debridement of nails both feet followed trimming the nails with dremel tool.  Discussed capsulitis left forefoot.  Told to wear powerstep insoles with different shoes.  If pain persists she need to make an appointment with Dr.  Tobie. RTC 3 months.   Cordella Bold DPM

## 2024-05-15 ENCOUNTER — Emergency Department (HOSPITAL_COMMUNITY)

## 2024-05-15 ENCOUNTER — Ambulatory Visit (INDEPENDENT_AMBULATORY_CARE_PROVIDER_SITE_OTHER)

## 2024-05-15 ENCOUNTER — Ambulatory Visit
Admission: EM | Admit: 2024-05-15 | Discharge: 2024-05-15 | Disposition: A | Discharge status: Home / Self Care | Attending: Family Medicine | Admitting: Family Medicine

## 2024-05-15 ENCOUNTER — Other Ambulatory Visit: Payer: Self-pay

## 2024-05-15 ENCOUNTER — Observation Stay (HOSPITAL_COMMUNITY)
Admission: EM | Admit: 2024-05-15 | Discharge: 2024-05-17 | Disposition: A | Discharge status: Home / Self Care | Source: Ambulatory Visit | Attending: Internal Medicine | Admitting: Internal Medicine

## 2024-05-15 ENCOUNTER — Encounter: Payer: Self-pay | Admitting: Emergency Medicine

## 2024-05-15 ENCOUNTER — Encounter (HOSPITAL_COMMUNITY): Payer: Self-pay

## 2024-05-15 DIAGNOSIS — I5031 Acute diastolic (congestive) heart failure: Secondary | ICD-10-CM | POA: Diagnosis not present

## 2024-05-15 DIAGNOSIS — J810 Acute pulmonary edema: Principal | ICD-10-CM | POA: Insufficient documentation

## 2024-05-15 DIAGNOSIS — M069 Rheumatoid arthritis, unspecified: Secondary | ICD-10-CM | POA: Insufficient documentation

## 2024-05-15 DIAGNOSIS — E1165 Type 2 diabetes mellitus with hyperglycemia: Secondary | ICD-10-CM | POA: Insufficient documentation

## 2024-05-15 DIAGNOSIS — I10 Essential (primary) hypertension: Secondary | ICD-10-CM

## 2024-05-15 DIAGNOSIS — R918 Other nonspecific abnormal finding of lung field: Secondary | ICD-10-CM | POA: Diagnosis not present

## 2024-05-15 DIAGNOSIS — J811 Chronic pulmonary edema: Secondary | ICD-10-CM | POA: Diagnosis not present

## 2024-05-15 DIAGNOSIS — E785 Hyperlipidemia, unspecified: Secondary | ICD-10-CM | POA: Insufficient documentation

## 2024-05-15 DIAGNOSIS — R0989 Other specified symptoms and signs involving the circulatory and respiratory systems: Secondary | ICD-10-CM | POA: Diagnosis not present

## 2024-05-15 DIAGNOSIS — E119 Type 2 diabetes mellitus without complications: Secondary | ICD-10-CM | POA: Insufficient documentation

## 2024-05-15 DIAGNOSIS — Z794 Long term (current) use of insulin: Secondary | ICD-10-CM | POA: Diagnosis not present

## 2024-05-15 DIAGNOSIS — J90 Pleural effusion, not elsewhere classified: Secondary | ICD-10-CM

## 2024-05-15 DIAGNOSIS — Z7982 Long term (current) use of aspirin: Secondary | ICD-10-CM | POA: Insufficient documentation

## 2024-05-15 DIAGNOSIS — J929 Pleural plaque without asbestos: Secondary | ICD-10-CM | POA: Diagnosis not present

## 2024-05-15 DIAGNOSIS — I110 Hypertensive heart disease with heart failure: Secondary | ICD-10-CM | POA: Insufficient documentation

## 2024-05-15 DIAGNOSIS — I509 Heart failure, unspecified: Secondary | ICD-10-CM | POA: Diagnosis not present

## 2024-05-15 DIAGNOSIS — R0789 Other chest pain: Secondary | ICD-10-CM | POA: Diagnosis not present

## 2024-05-15 DIAGNOSIS — R079 Chest pain, unspecified: Secondary | ICD-10-CM

## 2024-05-15 DIAGNOSIS — R7989 Other specified abnormal findings of blood chemistry: Secondary | ICD-10-CM | POA: Diagnosis not present

## 2024-05-15 DIAGNOSIS — I700 Atherosclerosis of aorta: Secondary | ICD-10-CM | POA: Diagnosis not present

## 2024-05-15 LAB — CBC
HCT: 36.2 % (ref 36.0–46.0)
Hemoglobin: 11.7 g/dL — ABNORMAL LOW (ref 12.0–15.0)
MCH: 31.7 pg (ref 26.0–34.0)
MCHC: 32.3 g/dL (ref 30.0–36.0)
MCV: 98.1 fL (ref 80.0–100.0)
Platelets: 283 K/uL (ref 150–400)
RBC: 3.69 MIL/uL — ABNORMAL LOW (ref 3.87–5.11)
RDW: 16.1 % — ABNORMAL HIGH (ref 11.5–15.5)
WBC: 13.6 K/uL — ABNORMAL HIGH (ref 4.0–10.5)
nRBC: 0 % (ref 0.0–0.2)

## 2024-05-15 LAB — BASIC METABOLIC PANEL WITH GFR
Anion gap: 9 (ref 5–15)
BUN: 19 mg/dL (ref 8–23)
CO2: 24 mmol/L (ref 22–32)
Calcium: 9.5 mg/dL (ref 8.9–10.3)
Chloride: 103 mmol/L (ref 98–111)
Creatinine, Ser: 0.87 mg/dL (ref 0.44–1.00)
GFR, Estimated: >60 mL/min (ref >60)
Glucose, Bld: 183 mg/dL — ABNORMAL HIGH (ref 70–99)
Potassium: 4 mmol/L (ref 3.5–5.1)
Sodium: 136 mmol/L (ref 135–145)

## 2024-05-15 LAB — BRAIN NATRIURETIC PEPTIDE: B Natriuretic Peptide: 45.1 pg/mL (ref 0.0–100.0)

## 2024-05-15 LAB — D-DIMER, QUANTITATIVE: D-Dimer, Quant: 1.58 ug{FEU}/mL — ABNORMAL HIGH (ref 0.00–0.50)

## 2024-05-15 LAB — TROPONIN I (HIGH SENSITIVITY): Troponin I (High Sensitivity): 4 ng/L (ref <18)

## 2024-05-15 MED ORDER — ASPIRIN 81 MG PO CHEW
324.0000 mg | CHEWABLE_TABLET | Freq: Once | ORAL | Status: AC
  Administered 2024-05-15: 324 mg via ORAL
  Filled 2024-05-15 (×3): qty 4

## 2024-05-15 MED ORDER — IOHEXOL 300 MG/ML  SOLN: 100.0000 mL | Freq: Once | INTRAMUSCULAR | Status: DC | PRN

## 2024-05-15 MED ORDER — FUROSEMIDE 10 MG/ML IJ SOLN
20.0000 mg | Freq: Once | INTRAMUSCULAR | Status: AC
  Administered 2024-05-15: 20 mg via INTRAVENOUS
  Filled 2024-05-15: qty 4

## 2024-05-15 MED ORDER — IOHEXOL 350 MG/ML SOLN
75.0000 mL | Freq: Once | INTRAVENOUS | Status: AC | PRN
  Administered 2024-05-15: 75 mL via INTRAVENOUS

## 2024-05-15 MED ORDER — NITROGLYCERIN 2 % TD OINT
1.0000 [in_us] | TOPICAL_OINTMENT | Freq: Four times a day (QID) | TRANSDERMAL | Status: DC
  Administered 2024-05-15: 1 [in_us] via TOPICAL
  Filled 2024-05-15: qty 1
  Filled 2024-05-15: qty 30

## 2024-05-15 MED ORDER — MORPHINE SULFATE (PF) 4 MG/ML IV SOLN
4.0000 mg | Freq: Once | INTRAVENOUS | Status: AC
  Administered 2024-05-15: 4 mg via INTRAVENOUS
  Filled 2024-05-15 (×3): qty 1

## 2024-05-15 NOTE — ED Notes (Signed)
 Patient is being discharged from the Urgent Care and sent to the Emergency Department via private vehicle . Per PA, patient is in need of higher level of care due to low O2 sat, right sided chest pain and fluid in right lung. Patient is aware and verbalizes understanding of plan of care.  Vitals:   05/15/24 1312  BP: (!) 151/72  Pulse: 70  Resp: 18  Temp: 98.7 F (37.1 C)  SpO2: 91%

## 2024-05-15 NOTE — Discharge Instructions (Signed)
 Your chest x-ray today showed a bit of fluid on your right lung and some possible pulmonary edema.  This could be indicative of an infection but could also have numerous other potential causes so given your active pain and decreased oxygen saturation from your baseline recommend further evaluation into your symptoms in the emergency department.

## 2024-05-15 NOTE — ED Notes (Signed)
Unable to obtain IV access, labs drawn and sent.

## 2024-05-15 NOTE — ED Provider Notes (Signed)
 [place] EMERGENCY DEPARTMENT AT [facility] Provider Note   CSN: [id number] Arrival date & time: 05/15/24  1656     Patient presents with: Chest Pain   Bethany Ray is a 78 y.o. female.    Chest Pain  Patient has history of diabetes hypertension arthritis breast cancer.  Patient had a right sided lumpectomy that was treated with radiation and chemo over 10 years ago.  Patient presents ED with complaints of right sided chest pain.  Patient states she started noticing the symptoms today.  Increases with deep breathing.  She has had some slight coughing.  No fevers.  Patient states she also has noticed some swelling in her legs but has history of rheumatoid arthritis and has noticed those symptoms in the past.  She denies any history of heart disease.  No history of PE.  Patient went to an urgent care today.  They sent her to the ED for further evaluation.    Prior to Admission medications   Medication Sig Start Date End Date Taking? Authorizing Provider  acetaminophen  (TYLENOL ) 500 MG tablet Take 500 mg by mouth every 8 (eight) hours as needed for moderate pain.    [provider]  amLODipine  (NORVASC ) 10 MG tablet Take 10 mg by mouth daily.    [provider]  aspirin  EC 81 MG tablet Take 81 mg by mouth daily.    [provider]  B Complex-C (B-COMPLEX WITH VITAMIN C) tablet Take 1 tablet by mouth daily.    [provider]  cetirizine  (ZYRTEC  ALLERGY) 10 MG tablet Take 1 tablet (10 mg total) by mouth daily. 01/15/23   Christopher Savannah, PA-C  empagliflozin (JARDIANCE) 10 MG TABS tablet Take 10 mg by mouth daily.    [provider]  fluticasone  (FLONASE ) 50 MCG/ACT nasal spray Place 2 sprays into both nostrils daily. 02/22/23   Rodriguez-Southworth, Sylvia, PA-C  folic acid  (FOLVITE ) 1 MG tablet Take 1 mg by mouth daily.    [provider]  glimepiride  (AMARYL ) 1 MG tablet Take 1 mg by mouth daily with breakfast.  06/16/16    [provider]  hydrochlorothiazide  (HYDRODIURIL ) 25 MG tablet Take 25 mg by mouth daily.    [provider]  loperamide (IMODIUM A-D) 2 MG tablet Take 2-4 mg by mouth 4 (four) times daily as needed for diarrhea or loose stools.    [provider]  metFORMIN  (GLUCOPHAGE -XR) 500 MG 24 hr tablet Take 500 mg by mouth 2 (two) times daily.  08/29/16   [provider]  methotrexate (RHEUMATREX) 2.5 MG tablet Take 10 mg by mouth 2 (two) times a week. Thursdays and Fridays 08/15/16   [provider]  Multiple Vitamin (MULITIVITAMIN WITH MINERALS) TABS Take 1 tablet by mouth daily.    [provider]  olmesartan  (BENICAR ) 40 MG tablet Take 40 mg by mouth daily. 02/21/22   [provider]  pravastatin  (PRAVACHOL ) 20 MG tablet Take 20 mg by mouth every evening.     [provider]  spironolactone  (ALDACTONE ) 25 MG tablet Take 25 mg by mouth daily.    [provider]  traMADol  (ULTRAM ) 50 MG tablet Take 50 mg by mouth every 6 (six) hours as needed for moderate pain.    [provider]  Turmeric Curcumin 500 MG CAPS Take 500 mg by mouth daily.    [provider]  valACYclovir (VALTREX) 1000 MG tablet Take 1,000 mg by mouth daily.    [provider]  Allergies: Penicillins    Review of Systems  Cardiovascular:  Positive for chest pain.    Updated Vital Signs BP (!) 155/62   Pulse 68   Temp 98.9 F (37.2 C)   Resp 18   Ht 1.524 m (5')   Wt 84.8 kg   SpO2 93%   BMI 36.52 kg/m   Physical Exam  (all labs ordered are listed, but only abnormal results are displayed) Labs Reviewed  CBC - Abnormal; Notable for the following components:      Result Value   WBC 13.6 (*)    RBC 3.69 (*)    Hemoglobin 11.7 (*)    RDW 16.1 (*)    All other components within normal limits  D-DIMER, QUANTITATIVE - Abnormal; Notable for the following components:   D-Dimer, Quant 1.58 (*)    All other components  within normal limits  BASIC METABOLIC PANEL WITH GFR - Abnormal; Notable for the following components:   Glucose, Bld 183 (*)    All other components within normal limits  BRAIN NATRIURETIC PEPTIDE  TROPONIN I (HIGH SENSITIVITY)    EKG: EKG Interpretation Date/Time:  Friday May 15 2024 17:09:23 EDT Ventricular Rate:  93 PR Interval:  142 QRS Duration:  82 QT Interval:  318 QTC Calculation: 395 R Axis:   -46  Text Interpretation: Normal sinus rhythm Left axis deviation Possible Anterolateral infarct , age undetermined Abnormal ECG When compared with ECG of 29-Mar-2022 11:05, Since last tracing rate faster Confirmed by Randol Simmonds [phone]) on 05/15/2024 5:18:22 PM  Radiology: CT Angio Chest PE W and/or Wo Contrast Result Date: 05/15/2024 CLINICAL DATA:  Positive D-dimer and chest pain, initial encounter EXAM: CT ANGIOGRAPHY CHEST WITH CONTRAST TECHNIQUE: Multidetector CT imaging of the chest was performed using the standard protocol during bolus administration of intravenous contrast. Multiplanar CT image reconstructions and MIPs were obtained to evaluate the vascular anatomy. RADIATION DOSE REDUCTION: This exam was performed according to the departmental dose-optimization program which includes automated exposure control, adjustment of the mA and/or kV according to patient size and/or use of iterative reconstruction technique. CONTRAST:  75mL OMNIPAQUE  IOHEXOL  350 MG/ML SOLN COMPARISON:  Chest x-ray from earlier in the same day, CT from 12/09/2017. FINDINGS: Cardiovascular: Atherosclerotic calcifications of the thoracic aorta are noted. No aneurysmal dilatation or dissection is seen. The heart is mildly enlarged in size. The pulmonary artery shows a normal branching pattern bilaterally. No filling defect to suggest pulmonary embolism is noted. Scattered coronary calcifications are seen. Mediastinum/Nodes: Thyroid  shows enlargement of the left lobe of the thyroid  without discrete nodule consistent  with a goiter. This extends into the upper mediastinum stable in appearance from the prior exam. This has been previously evaluated with percutaneous biopsy on 05/14/2006. No specific follow-up is recommended. No hilar or mediastinal adenopathy is noted. The esophagus as visualized is within normal limits. Lungs/Pleura: Lungs are well aerated bilaterally with mild bibasilar atelectasis. Mild changes centrally are noted consistent with mild pulmonary edema. No sizable effusion is seen. A few subpleural nodules are noted in the right upper lobe stable from prior exam in 2019 and consistent with benign etiology. No specific follow-up is recommended. Upper Abdomen: Gallbladder has been surgically removed. No acute abnormality in the upper abdomen is noted. Musculoskeletal: Degenerative changes of the thoracic spine are noted. No rib abnormality is seen. Review of the MIP images confirms the above findings. IMPRESSION: No evidence of pulmonary embolism. 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  This is stable from 2019 and has been previously biopsied in 2007. No specific follow-up is recommended. Mild central pulmonary edema. This is similar to the recent chest x-ray. Stable subpleural nodules in the right upper lobe dating back to 2019. No follow-up is recommended. Aortic Atherosclerosis (ICD10-I70.0). Electronically Signed   By: Oneil Devonshire M.D.   On: 05/15/2024 21:49   DG Chest 2 View Result Date: 05/15/2024 EXAM: 2 VIEW(S) XRAY OF THE CHEST 05/15/2024 02:18:31 PM COMPARISON: 08/30/2020 CLINICAL HISTORY: Right chest pain x 1 day. FINDINGS: LUNGS AND PLEURA: Mildly low lung volumes. Unchanged eventration of the lateral right hemidiaphragm. Increased diffuse interstitial opacities. Mild thickening of the right lateral pleura. HEART AND MEDIASTINUM: No acute abnormality of the cardiac and mediastinal silhouettes. BONES AND SOFT TISSUES: No acute osseous  abnormality. IMPRESSION: 1. Increased diffuse interstitial opacities, which may represent pulmonary edema . 2. Mild thickening of the right lateral pleura, which may represent small pleural effusion. Electronically signed by: Limin Xu MD 05/15/2024 02:30 PM EDT RP Workstation: [id number]     Procedures   Medications Ordered in the ED  nitroGLYCERIN  (NITROGLYN) 2 % ointment 1 inch (1 inch Topical Given 05/15/24 2314)  aspirin  chewable tablet 324 mg (324 mg Oral Given 05/15/24 2118)  morphine  (PF) 4 MG/ML injection 4 mg (4 mg Intravenous Given 05/15/24 2118)  iohexol  (OMNIPAQUE ) 350 MG/ML injection 75 mL (75 mLs Intravenous Contrast Given 05/15/24 2127)  furosemide  (LASIX ) injection 20 mg (20 mg Intravenous Given 05/15/24 2314)    Clinical Course as of 05/16/24 1503  Fri May 15, 2024  1929 CBC(!) CBC shows elevated white blood cell count, hemoglobin decreased at 11.7 slightly lower than previous values [JK]  1929 D-dimer, quantitative(!) D-dimer elevated [JK]  2112 Basic metabolic panel with GFR(!) Normal creatinine.  Normal troponin [JK]  2237 CT angiogram does not show evidence of pulmonary embolism.  Goiter noted similar to previous.  Patient has mild central pulmonary edema which was noted on x-ray [JK]  2324 Case discussed with Dr Lou [JK]    Clinical Course User Index [JK] Randol Simmonds, MD                                 Medical Decision Making Problems Addressed: Chest pain, unspecified type: acute illness or injury that poses a threat to life or bodily functions Congestive heart failure, unspecified HF chronicity, unspecified heart failure type [facility]): acute illness or injury that poses a threat to life or bodily functions  Amount and/or Complexity of Data Reviewed Labs: ordered. Decision-making details documented in ED Course. Radiology: ordered and independent interpretation performed.  Risk OTC drugs. Prescription drug management. Decision regarding  hospitalization.  Chest x-ray showed increased diffuse interstitial opacities.  Patient also had mild thickening the right lateral pleural which could suggest a small pleural effusion  Presented with complaints of chest pain primarily right-sided.  Patient is ED workup initially did not show any elevation in troponin.  No ST elevation noted on EKG.  Patient however did have an elevated D-dimer.  No evidence of PE on her CT scan it does show however show findings suggestive of pulmonary edema.  Patient does continue to have chest pain while she is here.  Considering her age and comorbidities and CHF findings we will start her on Lasix  nitroglycerin .  I will consult the medical service for admission.  May benefit from echocardiogram in the a.m.     Final  diagnoses:  Chest pain, unspecified type  Congestive heart failure, unspecified HF chronicity, unspecified heart failure type [facility])    ED Discharge Orders     None          Randol Simmonds, MD 05/16/24 1504

## 2024-05-15 NOTE — H&P (Signed)
 History and Physical  Bethany Ray FMW:[id number] DOB: [birth year] DOA: 05/15/2024  PCP: Sheryle Carwin, MD   Chief Complaint: Chest pain  HPI: Bethany Ray is a 78 y.o. female with medical history significant for rheumatoid arthritis on methotrexate, breast cancer s/p surgery, chemo and radiation, osteoarthritis, T2DM, HTN, and HLD who presented to the ED for evaluation of chest pain. Patient reports she has had right-sided chest pain since yesterday but was worse this morning. The pain is also in her axillary area/back shoulder and described as soreness, worse with deep breaths and movement. She endorsed mild dyspnea on exertion and ankle swelling over the last few days but denies any shortness of breath, nausea, vomiting, abdominal pain, fever, chills, headache or dizziness. Per daughter, patient lifted 6 packs of Coke zero and Gatorade after they went to the grocery yesterday and this is too heavy of a load for her.  ED Course: Initial vitals show patient afebrile but hypertensive with SBP in the 140s to 150s, SpO2 91-95% on room air. Initial labs significant for glucose 183, WBC 13.6, Hgb 11.9, D-dimer 1.58, troponin 4, BNP 45.1,. EKG shows normal sinus rhythm with no ST or T wave abnormalities. CXR shows pulmonary edema and small right pleural effusion. CTA chest PE study negative for PE but shows small central pulmonary edema.  Pt received ASA 324 mg x 1, IV morphine  4 mg x 1, IV Lasix  20 mg x 1 and Nitropaste. TRH was consulted for admission.   Review of Systems: Please see HPI for pertinent positives and negatives. A complete 10 system review of systems are otherwise negative.  Past Medical History:  Diagnosis Date   Arthritis    Breast cancer (HCC) 09/14/2013   Cancer (HCC) approx 2006   rt breast/lumpectomy/chemo/rad tx   Diabetes mellitus    High cholesterol    Hypertension    Shingles    Past Surgical History:  Procedure Laterality Date   ABDOMINAL HYSTERECTOMY     BREAST  SURGERY     CHOLECYSTECTOMY     COLONOSCOPY WITH PROPOFOL  N/A 04/03/2022   Procedure: COLONOSCOPY WITH PROPOFOL ;  Surgeon: Eartha Angelia Sieving, MD;  Location: [facility];  Service: Gastroenterology;  Laterality: N/A;  1:00, per Anette Caldron pt knows to arrive at 9:00   IR RADIOLOGY PERIPHERAL GUIDED IV START  11/18/2019   IR US  GUIDE VASC ACCESS RIGHT  11/18/2019   POLYPECTOMY  04/03/2022   Procedure: POLYPECTOMY;  Surgeon: Eartha Angelia Sieving, MD;  Location: [facility];  Service: Gastroenterology;;   Social History:  reports that she has never smoked. She has never used smokeless tobacco. She reports that she does not drink alcohol and does not use drugs.  Allergies  Allergen Reactions   Penicillins Nausea And Vomiting    Childhood allergy  Other Reaction(s): Not available  Product containing penicillin and antibiotic (product)    History reviewed. No pertinent family history.   Prior to Admission medications   Medication Sig Start Date End Date Taking? Authorizing Provider  acetaminophen  (TYLENOL ) 500 MG tablet Take 500 mg by mouth every 8 (eight) hours as needed for moderate pain.    [provider]  amLODipine  (NORVASC ) 10 MG tablet Take 10 mg by mouth daily.    [provider]  aspirin  EC 81 MG tablet Take 81 mg by mouth daily.    [provider]  B Complex-C (B-COMPLEX WITH VITAMIN C) tablet Take 1 tablet by mouth daily.    [provider]  cetirizine  (ZYRTEC  ALLERGY) 10 MG tablet Take 1 tablet (10 mg total) by mouth daily. 01/15/23   Christopher Savannah, PA-C  empagliflozin (JARDIANCE) 10 MG TABS tablet Take 10 mg by mouth daily.    [provider]  fluticasone  (FLONASE ) 50 MCG/ACT nasal spray Place 2 sprays into both nostrils daily. 02/22/23   Rodriguez-Southworth, Sylvia, PA-C  folic acid  (FOLVITE ) 1 MG tablet Take 1 mg by mouth daily.    [provider]  glimepiride  (AMARYL ) 1 MG tablet Take 1 mg by mouth daily with  breakfast.  06/16/16   [provider]  hydrochlorothiazide  (HYDRODIURIL ) 25 MG tablet Take 25 mg by mouth daily.    [provider]  loperamide (IMODIUM A-D) 2 MG tablet Take 2-4 mg by mouth 4 (four) times daily as needed for diarrhea or loose stools.    [provider]  metFORMIN  (GLUCOPHAGE -XR) 500 MG 24 hr tablet Take 500 mg by mouth 2 (two) times daily.  08/29/16   [provider]  methotrexate (RHEUMATREX) 2.5 MG tablet Take 10 mg by mouth 2 (two) times a week. Thursdays and Fridays 08/15/16   [provider]  Multiple Vitamin (MULITIVITAMIN WITH MINERALS) TABS Take 1 tablet by mouth daily.    [provider]  olmesartan  (BENICAR ) 40 MG tablet Take 40 mg by mouth daily. 02/21/22   [provider]  pravastatin  (PRAVACHOL ) 20 MG tablet Take 20 mg by mouth every evening.     [provider]  spironolactone  (ALDACTONE ) 25 MG tablet Take 25 mg by mouth daily.    [provider]  traMADol  (ULTRAM ) 50 MG tablet Take 50 mg by mouth every 6 (six) hours as needed for moderate pain.    [provider]  Turmeric Curcumin 500 MG CAPS Take 500 mg by mouth daily.    [provider]  valACYclovir (VALTREX) 1000 MG tablet Take 1,000 mg by mouth daily.    [provider]    Physical Exam: BP (!) 138/48 (BP Location: Left Arm)   Pulse 66   Temp 97.8 F (36.6 C) (Oral)   Resp (!) 24   Ht 5' (1.524 m)   Wt 84.8 kg   SpO2 94%   BMI 36.52 kg/m  General: Pleasant, well-appearing elderly obese woman laying in bed. No acute distress. HEENT: [place]/AT. Anicteric sclera Chest wall: Mild tenderness palpation of the right chest wall and deltoid muscles CV: RRR. II/VI soft systolic murmur. Trace edema to her ankles. Pulmonary: Lungs CTAB. Normal effort. No wheezing or rales. Abdominal: Soft, nontender, nondistended. Normal bowel sounds. Extremities: Palpable radial and DP pulses. Normal ROM. Skin: Warm and dry.  No obvious rash or lesions. Neuro: A&Ox3. Moves all extremities. Normal sensation to light touch. No focal deficit. Psych: Normal mood and affect          Labs on Admission:  Basic Metabolic Panel: Recent Labs  Lab 05/15/24 2033  NA 136  K 4.0  CL 103  CO2 24  GLUCOSE 183*  BUN 19  CREATININE 0.87  CALCIUM 9.5   Liver Function Tests: No results for input(s): AST, ALT, ALKPHOS, BILITOT, PROT, ALBUMIN in the last 168 hours. No results for input(s): LIPASE, AMYLASE in the last 168 hours. No results for input(s): AMMONIA in the last 168 hours. CBC: Recent Labs  Lab 05/15/24 1853  WBC 13.6*  HGB 11.7*  HCT 36.2  MCV 98.1  PLT 283   Cardiac Enzymes: No results for input(s): CKTOTAL, CKMB, CKMBINDEX, TROPONINI in the last  168 hours. BNP (last 3 results) Recent Labs    05/15/24 1853  BNP 45.1    ProBNP (last 3 results) No results for input(s): PROBNP in the last 8760 hours.  CBG: Recent Labs  Lab 05/16/24 0100  GLUCAP 130*    Radiological Exams on Admission: CT Angio Chest PE W and/or Wo Contrast Result Date: 05/15/2024 CLINICAL DATA:  Positive D-dimer and chest pain, initial encounter EXAM: CT ANGIOGRAPHY CHEST WITH CONTRAST TECHNIQUE: Multidetector CT imaging of the chest was performed using the standard protocol during bolus administration of intravenous contrast. Multiplanar CT image reconstructions and MIPs were obtained to evaluate the vascular anatomy. RADIATION DOSE REDUCTION: This exam was performed according to the departmental dose-optimization program which includes automated exposure control, adjustment of the mA and/or kV according to patient size and/or use of iterative reconstruction technique. CONTRAST:  75mL OMNIPAQUE  IOHEXOL  350 MG/ML SOLN COMPARISON:  Chest x-ray from earlier in the same day, CT from 12/09/2017. FINDINGS: Cardiovascular: Atherosclerotic calcifications of the thoracic aorta are noted. No aneurysmal  dilatation or dissection is seen. The heart is mildly enlarged in size. The pulmonary artery shows a normal branching pattern bilaterally. No filling defect to suggest pulmonary embolism is noted. Scattered coronary calcifications are seen. Mediastinum/Nodes: Thyroid  shows enlargement of the left lobe of the thyroid  without discrete nodule consistent with a goiter. This extends into the upper mediastinum stable in appearance from the prior exam. This has been previously evaluated with percutaneous biopsy on 05/14/2006. No specific follow-up is recommended. No hilar or mediastinal adenopathy is noted. The esophagus as visualized is within normal limits. Lungs/Pleura: Lungs are well aerated bilaterally with mild bibasilar atelectasis. Mild changes centrally are noted consistent with mild pulmonary edema. No sizable effusion is seen. A few subpleural nodules are noted in the right upper lobe stable from prior exam in 2019 and consistent with benign etiology. No specific follow-up is recommended. Upper Abdomen: Gallbladder has been surgically removed. No acute abnormality in the upper abdomen is noted. Musculoskeletal: Degenerative changes of the thoracic spine are noted. No rib abnormality is seen. Review of the MIP images confirms the above findings. IMPRESSION: No evidence of pulmonary embolism. 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 This is stable from 2019 and has been previously biopsied in 2007. No specific follow-up is recommended. Mild central pulmonary edema. This is similar to the recent chest x-ray. Stable subpleural nodules in the right upper lobe dating back to 2019. No follow-up is recommended. Aortic Atherosclerosis (ICD10-I70.0). Electronically Signed   By: Oneil Devonshire M.D.   On: 05/15/2024 21:49   DG Chest 2 View Result Date: 05/15/2024 EXAM: 2 VIEW(S) XRAY OF THE CHEST 05/15/2024 02:18:31 PM COMPARISON: 08/30/2020 CLINICAL HISTORY: Right chest  pain x 1 day. FINDINGS: LUNGS AND PLEURA: Mildly low lung volumes. Unchanged eventration of the lateral right hemidiaphragm. Increased diffuse interstitial opacities. Mild thickening of the right lateral pleura. HEART AND MEDIASTINUM: No acute abnormality of the cardiac and mediastinal silhouettes. BONES AND SOFT TISSUES: No acute osseous abnormality. IMPRESSION: 1. Increased diffuse interstitial opacities, which may represent pulmonary edema . 2. Mild thickening of the right lateral pleura, which may represent small pleural effusion. Electronically signed by: Manford Cummins MD 05/15/2024 02:30 PM EDT RP Workstation: [id number]   Assessment/Plan Bethany Ray is a 78 y.o. female with medical history significant for rheumatoid arthritis on methotrexate, breast cancer s/p surgery, chemo and radiation, osteoarthritis, T2DM, HTN, and HLD who presented to the ED for  evaluation of chest pain.   # Chest pain - Presented with acute onset of atypical chest pain after recently lifting a heavy object - ACS ruled out with normal troponin and EKG - Patient with tenderness to palpation of the chest wall on exam - Chest pain likely MSK-related - Apply lidocaine  patch to chest wall - As needed Tylenol  for pain  # Pulmonary edema - Patient with no history of heart failure found to have mild pulmonary edema on chest imaging - She has mild ankle edema but no clinical or laboratory signs of CHF - S/p IV Lasix  20 mg in the ED - Check TTE to assess systolic function - Strict I&O's, daily weights - Supplemental O2 as needed  # T2DM - No recent A1c on file, blood glucose 183 on admission - Home regimen includes metformin , pioglitazone and glimepiride  - Continue metformin  and glimepiride  - Hold pioglitazone due to increased risks of heart failure - SSI with meals, CBG monitoring - Follow-up repeat A1c  # HTN - BP elevated with SBP in the 130s to 150s - Continue amlodipine , HCTZ, spironolactone  and olmesartan   (irbesartan  as substitute)  # HLD - Continue pravastatin   # Rheumatoid arthritis - Takes methotrexate twice weekly on Thursdays and Fridays  DVT prophylaxis: Lovenox      Code Status: Full Code  Consults called: None  Family Communication: Discussed admission with daughter at bedside  Severity of Illness: The appropriate patient status for this patient is OBSERVATION. Observation status is judged to be reasonable and necessary in order to provide the required intensity of service to ensure the patient's safety. The patient's presenting symptoms, physical exam findings, and initial radiographic and laboratory data in the context of their medical condition is felt to place them at decreased risk for further clinical deterioration. Furthermore, it is anticipated that the patient will be medically stable for discharge from the hospital within 2 midnights of admission.   Level of care: Telemetry   This record has been created using Conservation officer, historic buildings. Errors have been sought and corrected, but may not always be located. Such creation errors do not reflect on the standard of care.   Lou Claretta HERO, MD 05/16/2024, 2:14 AM [facility] Pager: [phone] Isaiah 41:10   If 7PM-7AM, please contact night-coverage [email or web address] Password [email or web address]

## 2024-05-15 NOTE — ED Triage Notes (Signed)
 Sent from [facility] for diagnosis of left side pleural effusion and acute pulmonary edema.

## 2024-05-15 NOTE — ED Triage Notes (Signed)
 Upper right sided chest pain since last night.  States has been coughing for a couple of days.  States her chest feels sore when she coughs

## 2024-05-16 ENCOUNTER — Observation Stay (HOSPITAL_BASED_OUTPATIENT_CLINIC_OR_DEPARTMENT_OTHER)

## 2024-05-16 DIAGNOSIS — R0609 Other forms of dyspnea: Secondary | ICD-10-CM

## 2024-05-16 DIAGNOSIS — E1165 Type 2 diabetes mellitus with hyperglycemia: Secondary | ICD-10-CM

## 2024-05-16 DIAGNOSIS — J810 Acute pulmonary edema: Secondary | ICD-10-CM

## 2024-05-16 DIAGNOSIS — I10 Essential (primary) hypertension: Secondary | ICD-10-CM

## 2024-05-16 DIAGNOSIS — I509 Heart failure, unspecified: Secondary | ICD-10-CM

## 2024-05-16 DIAGNOSIS — R079 Chest pain, unspecified: Secondary | ICD-10-CM

## 2024-05-16 LAB — CBC
HCT: 33.9 % — ABNORMAL LOW (ref 36.0–46.0)
Hemoglobin: 10.9 g/dL — ABNORMAL LOW (ref 12.0–15.0)
MCH: 31.3 pg (ref 26.0–34.0)
MCHC: 32.2 g/dL (ref 30.0–36.0)
MCV: 97.4 fL (ref 80.0–100.0)
Platelets: 261 K/uL (ref 150–400)
RBC: 3.48 MIL/uL — ABNORMAL LOW (ref 3.87–5.11)
RDW: 16.2 % — ABNORMAL HIGH (ref 11.5–15.5)
WBC: 12.6 K/uL — ABNORMAL HIGH (ref 4.0–10.5)
nRBC: 0 % (ref 0.0–0.2)

## 2024-05-16 LAB — ECHOCARDIOGRAM COMPLETE
AR max vel: 1.78 cm2
AV Area VTI: 1.94 cm2
AV Area mean vel: 1.87 cm2
AV Mean grad: 9 mmHg
AV Peak grad: 18 mmHg
Ao pk vel: 2.12 m/s
Area-P 1/2: 2.76 cm2
Height: 60 in
S' Lateral: 2.7 cm
Weight: 2992 [oz_av]

## 2024-05-16 LAB — BASIC METABOLIC PANEL WITH GFR
Anion gap: 10 (ref 5–15)
BUN: 19 mg/dL (ref 8–23)
CO2: 23 mmol/L (ref 22–32)
Calcium: 9.6 mg/dL (ref 8.9–10.3)
Chloride: 103 mmol/L (ref 98–111)
Creatinine, Ser: 0.89 mg/dL (ref 0.44–1.00)
GFR, Estimated: >60 mL/min (ref >60)
Glucose, Bld: 204 mg/dL — ABNORMAL HIGH (ref 70–99)
Potassium: 3.8 mmol/L (ref 3.5–5.1)
Sodium: 136 mmol/L (ref 135–145)

## 2024-05-16 LAB — GLUCOSE, CAPILLARY
Glucose-Capillary: 130 mg/dL — ABNORMAL HIGH (ref 70–99)
Glucose-Capillary: 174 mg/dL — ABNORMAL HIGH (ref 70–99)
Glucose-Capillary: 184 mg/dL — ABNORMAL HIGH (ref 70–99)
Glucose-Capillary: 122 mg/dL — ABNORMAL HIGH (ref 70–99)
Glucose-Capillary: 170 mg/dL — ABNORMAL HIGH (ref 70–99)

## 2024-05-16 LAB — MAGNESIUM: Magnesium: 1.9 mg/dL (ref 1.7–2.4)

## 2024-05-16 LAB — HEPATIC FUNCTION PANEL
ALT: 22 U/L (ref 0–44)
AST: 24 U/L (ref 15–41)
Albumin: 3.5 g/dL (ref 3.5–5.0)
Alkaline Phosphatase: 67 U/L (ref 38–126)
Bilirubin, Direct: 0.1 mg/dL (ref 0.0–0.2)
Indirect Bilirubin: 0.6 mg/dL (ref 0.3–0.9)
Total Bilirubin: 0.7 mg/dL (ref 0.0–1.2)
Total Protein: 7.5 g/dL (ref 6.5–8.1)

## 2024-05-16 LAB — VITAMIN B12: Vitamin B-12: 185 pg/mL (ref 180–914)

## 2024-05-16 LAB — LIPASE, BLOOD: Lipase: 25 U/L (ref 11–51)

## 2024-05-16 MED ORDER — ACETAMINOPHEN 650 MG RE SUPP: 650.0000 mg | Freq: Four times a day (QID) | RECTAL | Status: DC | PRN

## 2024-05-16 MED ORDER — PANTOPRAZOLE SODIUM 40 MG PO TBEC
40.0000 mg | DELAYED_RELEASE_TABLET | Freq: Every day | ORAL | Status: DC
  Administered 2024-05-16 – 2024-05-17 (×2): 40 mg via ORAL
  Filled 2024-05-16 (×2): qty 1

## 2024-05-16 MED ORDER — AZITHROMYCIN 250 MG PO TABS
500.0000 mg | ORAL_TABLET | Freq: Every day | ORAL | Status: DC
  Administered 2024-05-16 – 2024-05-17 (×2): 500 mg via ORAL
  Filled 2024-05-16 (×2): qty 2

## 2024-05-16 MED ORDER — INSULIN ASPART 100 UNIT/ML IJ SOLN
0.0000 [IU] | Freq: Three times a day (TID) | INTRAMUSCULAR | Status: DC
  Administered 2024-05-16 (×2): 3 [IU] via SUBCUTANEOUS
  Administered 2024-05-16: 2 [IU] via SUBCUTANEOUS
  Administered 2024-05-17: 3 [IU] via SUBCUTANEOUS
  Filled 2024-05-16: qty 0.15

## 2024-05-16 MED ORDER — PRAVASTATIN SODIUM 20 MG PO TABS
20.0000 mg | ORAL_TABLET | Freq: Every evening | ORAL | Status: DC
Start: 2024-05-16 — End: 2024-05-17
  Administered 2024-05-16: 20 mg via ORAL
  Filled 2024-05-16: qty 1

## 2024-05-16 MED ORDER — HYDROCHLOROTHIAZIDE 25 MG PO TABS: 25.0000 mg | ORAL_TABLET | Freq: Every day | ORAL | Status: DC

## 2024-05-16 MED ORDER — ENOXAPARIN SODIUM 40 MG/0.4ML IJ SOSY
40.0000 mg | PREFILLED_SYRINGE | INTRAMUSCULAR | Status: DC
  Administered 2024-05-16 – 2024-05-17 (×2): 40 mg via SUBCUTANEOUS
  Filled 2024-05-16 (×2): qty 0.4

## 2024-05-16 MED ORDER — ASPIRIN 81 MG PO TBEC
81.0000 mg | DELAYED_RELEASE_TABLET | Freq: Every day | ORAL | Status: DC
  Administered 2024-05-16 – 2024-05-17 (×2): 81 mg via ORAL
  Filled 2024-05-16 (×2): qty 1

## 2024-05-16 MED ORDER — FOLIC ACID 1 MG PO TABS
1.0000 mg | ORAL_TABLET | Freq: Every day | ORAL | Status: DC
  Administered 2024-05-16 – 2024-05-17 (×2): 1 mg via ORAL
  Filled 2024-05-16 (×2): qty 1

## 2024-05-16 MED ORDER — AMLODIPINE BESYLATE 10 MG PO TABS
10.0000 mg | ORAL_TABLET | Freq: Every day | ORAL | Status: DC
  Administered 2024-05-16 – 2024-05-17 (×2): 10 mg via ORAL
  Filled 2024-05-16 (×2): qty 1

## 2024-05-16 MED ORDER — SPIRONOLACTONE 25 MG PO TABS
25.0000 mg | ORAL_TABLET | Freq: Every day | ORAL | Status: DC
  Administered 2024-05-16 – 2024-05-17 (×2): 25 mg via ORAL
  Filled 2024-05-16 (×2): qty 1

## 2024-05-16 MED ORDER — ONDANSETRON HCL 4 MG PO TABS
4.0000 mg | ORAL_TABLET | Freq: Four times a day (QID) | ORAL | Status: DC | PRN
Start: 2024-05-16 — End: 2024-05-17

## 2024-05-16 MED ORDER — ACETAMINOPHEN 325 MG PO TABS
650.0000 mg | ORAL_TABLET | ORAL | Status: DC | PRN
  Administered 2024-05-16 (×3): 650 mg via ORAL
  Filled 2024-05-16 (×3): qty 2

## 2024-05-16 MED ORDER — ACETAMINOPHEN 325 MG PO TABS
650.0000 mg | ORAL_TABLET | Freq: Four times a day (QID) | ORAL | Status: DC | PRN
  Administered 2024-05-16: 650 mg via ORAL
  Filled 2024-05-16: qty 2

## 2024-05-16 MED ORDER — ACETAMINOPHEN 650 MG RE SUPP: 650.0000 mg | RECTAL | Status: DC | PRN

## 2024-05-16 MED ORDER — IRBESARTAN 300 MG PO TABS
300.0000 mg | ORAL_TABLET | Freq: Every day | ORAL | Status: DC
  Administered 2024-05-16: 300 mg via ORAL
  Filled 2024-05-16: qty 1

## 2024-05-16 MED ORDER — DICLOFENAC SODIUM 1 % EX GEL
2.0000 g | Freq: Four times a day (QID) | CUTANEOUS | Status: DC
  Administered 2024-05-16 – 2024-05-17 (×4): 2 g via TOPICAL
  Filled 2024-05-16: qty 100

## 2024-05-16 MED ORDER — LIDOCAINE 5 % EX PTCH
1.0000 | MEDICATED_PATCH | CUTANEOUS | Status: DC
  Administered 2024-05-16 – 2024-05-17 (×2): 1 via TRANSDERMAL
  Filled 2024-05-16 (×2): qty 1

## 2024-05-16 MED ORDER — B COMPLEX-C PO TABS
1.0000 | ORAL_TABLET | Freq: Every day | ORAL | Status: DC
  Administered 2024-05-16 – 2024-05-17 (×2): 1 via ORAL
  Filled 2024-05-16 (×2): qty 1

## 2024-05-16 MED ORDER — METFORMIN HCL ER 500 MG PO TB24: 500.0000 mg | ORAL_TABLET | Freq: Two times a day (BID) | ORAL | Status: DC

## 2024-05-16 MED ORDER — INSULIN ASPART 100 UNIT/ML IJ SOLN
0.0000 [IU] | Freq: Every day | INTRAMUSCULAR | Status: DC
  Filled 2024-05-16: qty 0.05

## 2024-05-16 MED ORDER — FUROSEMIDE 10 MG/ML IJ SOLN
20.0000 mg | Freq: Every day | INTRAMUSCULAR | Status: DC
  Administered 2024-05-16: 20 mg via INTRAVENOUS
  Filled 2024-05-16: qty 2

## 2024-05-16 MED ORDER — POTASSIUM CHLORIDE CRYS ER 20 MEQ PO TBCR
20.0000 meq | EXTENDED_RELEASE_TABLET | Freq: Once | ORAL | Status: AC
  Administered 2024-05-16: 20 meq via ORAL
  Filled 2024-05-16: qty 1

## 2024-05-16 MED ORDER — ONDANSETRON HCL 4 MG/2ML IJ SOLN: 4.0000 mg | Freq: Four times a day (QID) | INTRAMUSCULAR | Status: DC | PRN

## 2024-05-16 MED ORDER — SENNOSIDES-DOCUSATE SODIUM 8.6-50 MG PO TABS: 1.0000 | ORAL_TABLET | Freq: Every evening | ORAL | Status: DC | PRN

## 2024-05-16 MED ORDER — GLIMEPIRIDE 1 MG PO TABS
1.0000 mg | ORAL_TABLET | Freq: Every day | ORAL | Status: DC
  Filled 2024-05-16: qty 1

## 2024-05-16 MED ORDER — BISACODYL 5 MG PO TBEC
5.0000 mg | DELAYED_RELEASE_TABLET | Freq: Every day | ORAL | Status: DC | PRN
Start: 2024-05-16 — End: 2024-05-17

## 2024-05-16 NOTE — Progress Notes (Signed)
  Echocardiogram 2D Echocardiogram has been performed.  Koleen KANDICE Popper, RDCS 05/16/2024, 9:40 AM

## 2024-05-16 NOTE — Care Management Obs Status (Signed)
 MEDICARE OBSERVATION STATUS NOTIFICATION   Patient Details  Name: BRINA UMEDA MRN: [id number] Date of Birth: 1946/06/23   Medicare Observation Status Notification Given:   Yes    Heather DELENA Saltness, LCSW 05/16/2024, 4:44 PM

## 2024-05-16 NOTE — Progress Notes (Signed)
 PROGRESS NOTE    Bethany Ray  FMW:[id number] DOB: 08/17/1946 DOA: 05/15/2024 PCP: Sheryle Carwin, MD   Brief Narrative: 78 year old with past medical history significant for rheumatoid arthritis on methotrexate, breast cancer status postsurgery, chemoradiation, osteoarthritis, diabetes type 2, hypertension, hyperlipidemia who presented to the ED for evaluation of chest pain right side worse in the morning of admission.  Pain also in the axillary area and back shoulder.  She also reports mild dyspnea on exertion and ankle with swelling.  Per daughter, patient lifted 6 packs of Coke cereal and Gatorade after they went to the grocery store, there is felt this was too heavy load for patient.  Patient was hypertensive systolic blood pressure 150, white blood cell 13, hemoglobin 11, D-dimer 1.5, troponin 4, BNP 45.  EKG normal sinus rhythm.  Chest x-ray pulmonary edema small pleural effusion.  CT chest PE negative, but showed small central pulmonary edema.    Assessment & Plan:   Principal Problem:   Chest pain Active Problems:   Acute pulmonary edema (HCC)   Uncontrolled type 2 diabetes mellitus with hyperglycemia, without long-term current use of insulin  (HCC)   Essential hypertension   1-Chest pain Muscles skeletal component of pleuresis.  Report cough, leukocytosis. Added Azithromycin .  Tylenol  PRN, Lidocaine , Voltaren  gel.   Acute Diastolic HF exacerbation.  Pulmonary edema: -Presents with Dyspnea, cough, CTA  with pulmonary edema. Oxygen 91 RA.  ECHO: Showed diastolic dysfunction.  Change hydrochlorothiazide  to lasix .    Diabetes type 2: Hold Metformin  and Amaryl  while in patient.  SSI  Hypertension: -Continue with Norvasc , spironolactone , ARB  Hyperlipidemia -Continue with Pravachol .   Rheumatoid arthritis Takes methotrexate twice weekly on Thursdays and Fridays     Estimated body mass index is 36.52 kg/m as calculated from the following:   Height as of this  encounter: 5' (1.524 m).   Weight as of this encounter: 84.8 kg.   DVT prophylaxis: Lovenox  Code Status: Full code Family Communication: Disposition Plan:  Status is: Observation The patient remains OBS appropriate and will d/c before 2 midnights.    Consultants:  None  Procedures:    Antimicrobials:    Subjective: Report   Objective: Vitals:   05/15/24 2200 05/16/24 0052 05/16/24 0509 05/16/24 0742  BP:  (!) 138/48 137/71 (!) 142/60  Pulse:  66 61 61  Resp:  (!) 24 (!) 24 14  Temp: 98.9 F (37.2 C) 97.8 F (36.6 C) 98.1 F (36.7 C) 99.1 F (37.3 C)  TempSrc:  Oral Oral Oral  SpO2:  94% 94% 94%  Weight:      Height:        Intake/Output Summary (Last 24 hours) at 05/16/2024 0743 Last data filed at 05/16/2024 0657 Gross per 24 hour  Intake 540 ml  Output --  Net 540 ml   Filed Weights   05/15/24 1840  Weight: 84.8 kg    Examination:  General exam: Appears calm and comfortable  Respiratory system: BL crackles.  Respiratory effort normal. Cardiovascular system: S1 & S2 heard, RRR.  Gastrointestinal system: Abdomen is nondistended, soft and nontender. No organomegaly or masses felt. Normal bowel sounds heard. Central nervous system: Alert and oriented. No focal neurological deficits. Extremities: Symmetric 5 x 5 power.    Data Reviewed: I have personally reviewed following labs and imaging studies  CBC: Recent Labs  Lab 05/15/24 1853 05/16/24 0451  WBC 13.6* 12.6*  HGB 11.7* 10.9*  HCT 36.2 33.9*  MCV 98.1 97.4  PLT 283 261  Basic Metabolic Panel: Recent Labs  Lab 05/15/24 2033 05/16/24 0451  NA 136 136  K 4.0 3.8  CL 103 103  CO2 24 23  GLUCOSE 183* 204*  BUN 19 19  CREATININE 0.87 0.89  CALCIUM 9.5 9.6   GFR: Estimated Creatinine Clearance: 49.5 mL/min (by C-G formula based on SCr of 0.89 mg/dL). Liver Function Tests: No results for input(s): AST, ALT, ALKPHOS, BILITOT, PROT, ALBUMIN in the last 168 hours. No  results for input(s): LIPASE, AMYLASE in the last 168 hours. No results for input(s): AMMONIA in the last 168 hours. Coagulation Profile: No results for input(s): INR, PROTIME in the last 168 hours. Cardiac Enzymes: No results for input(s): CKTOTAL, CKMB, CKMBINDEX, TROPONINI in the last 168 hours. BNP (last 3 results) No results for input(s): PROBNP in the last 8760 hours. HbA1C: No results for input(s): HGBA1C in the last 72 hours. CBG: Recent Labs  Lab 05/16/24 0100 05/16/24 0737  GLUCAP 130* 174*   Lipid Profile: No results for input(s): CHOL, HDL, LDLCALC, TRIG, CHOLHDL, LDLDIRECT in the last 72 hours. Thyroid  Function Tests: No results for input(s): TSH, T4TOTAL, FREET4, T3FREE, THYROIDAB in the last 72 hours. Anemia Panel: No results for input(s): VITAMINB12, FOLATE, FERRITIN, TIBC, IRON, RETICCTPCT in the last 72 hours. Sepsis Labs: No results for input(s): PROCALCITON, LATICACIDVEN in the last 168 hours.  No results found for this or any previous visit (from the past 240 hours).       Radiology Studies: CT Angio Chest PE W and/or Wo Contrast Result Date: 05/15/2024 CLINICAL DATA:  Positive D-dimer and chest pain, initial encounter EXAM: CT ANGIOGRAPHY CHEST WITH CONTRAST TECHNIQUE: Multidetector CT imaging of the chest was performed using the standard protocol during bolus administration of intravenous contrast. Multiplanar CT image reconstructions and MIPs were obtained to evaluate the vascular anatomy. RADIATION DOSE REDUCTION: This exam was performed according to the departmental dose-optimization program which includes automated exposure control, adjustment of the mA and/or kV according to patient size and/or use of iterative reconstruction technique. CONTRAST:  75mL OMNIPAQUE  IOHEXOL  350 MG/ML SOLN COMPARISON:  Chest x-ray from earlier in the same day, CT from 12/09/2017. FINDINGS: Cardiovascular:  Atherosclerotic calcifications of the thoracic aorta are noted. No aneurysmal dilatation or dissection is seen. The heart is mildly enlarged in size. The pulmonary artery shows a normal branching pattern bilaterally. No filling defect to suggest pulmonary embolism is noted. Scattered coronary calcifications are seen. Mediastinum/Nodes: Thyroid  shows enlargement of the left lobe of the thyroid  without discrete nodule consistent with a goiter. This extends into the upper mediastinum stable in appearance from the prior exam. This has been previously evaluated with percutaneous biopsy on 05/14/2006. No specific follow-up is recommended. No hilar or mediastinal adenopathy is noted. The esophagus as visualized is within normal limits. Lungs/Pleura: Lungs are well aerated bilaterally with mild bibasilar atelectasis. Mild changes centrally are noted consistent with mild pulmonary edema. No sizable effusion is seen. A few subpleural nodules are noted in the right upper lobe stable from prior exam in 2019 and consistent with benign etiology. No specific follow-up is recommended. Upper Abdomen: Gallbladder has been surgically removed. No acute abnormality in the upper abdomen is noted. Musculoskeletal: Degenerative changes of the thoracic spine are noted. No rib abnormality is seen. Review of the MIP images confirms the above findings. IMPRESSION: No evidence of pulmonary embolism. 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 This is stable from 2019 and has been previously biopsied in  2007. No specific follow-up is recommended. Mild central pulmonary edema. This is similar to the recent chest x-ray. Stable subpleural nodules in the right upper lobe dating back to 2019. No follow-up is recommended. Aortic Atherosclerosis (ICD10-I70.0). Electronically Signed   By: Oneil Devonshire M.D.   On: 05/15/2024 21:49   DG Chest 2 View Result Date: 05/15/2024 EXAM: 2 VIEW(S) XRAY OF THE  CHEST 05/15/2024 02:18:31 PM COMPARISON: 08/30/2020 CLINICAL HISTORY: Right chest pain x 1 day. FINDINGS: LUNGS AND PLEURA: Mildly low lung volumes. Unchanged eventration of the lateral right hemidiaphragm. Increased diffuse interstitial opacities. Mild thickening of the right lateral pleura. HEART AND MEDIASTINUM: No acute abnormality of the cardiac and mediastinal silhouettes. BONES AND SOFT TISSUES: No acute osseous abnormality. IMPRESSION: 1. Increased diffuse interstitial opacities, which may represent pulmonary edema . 2. Mild thickening of the right lateral pleura, which may represent small pleural effusion. Electronically signed by: Limin Xu MD 05/15/2024 02:30 PM EDT RP Workstation: [id number]        Scheduled Meds:  amLODipine   10 mg Oral Daily   aspirin  EC  81 mg Oral Daily   B-complex with vitamin C  1 tablet Oral Daily   enoxaparin  (LOVENOX ) injection  40 mg Subcutaneous Q24H   folic acid   1 mg Oral Daily   glimepiride   1 mg Oral Q breakfast   hydrochlorothiazide   25 mg Oral Daily   insulin  aspart  0-15 Units Subcutaneous TID WC   insulin  aspart  0-5 Units Subcutaneous QHS   irbesartan   300 mg Oral Daily   lidocaine   1 patch Transdermal Q24H   [START ON 05/17/2024] metFORMIN   500 mg Oral BID   nitroGLYCERIN   1 inch Topical Q6H   pravastatin   20 mg Oral QPM   spironolactone   25 mg Oral Daily   Continuous Infusions:   LOS: 0 days    Time spent: 35 Minutes    Kedra Mcglade A Dwon Sky, MD [facility]   If 7PM-7AM, please contact night-coverage [email or web address]  05/16/2024, 7:43 AM

## 2024-05-16 NOTE — ED Provider Notes (Signed)
 [facility]    CSN: [id number] Arrival date & time: 05/15/24  1301      History   Chief Complaint No chief complaint on file.   HPI Bethany Ray is a 78 y.o. female.   Patient with past medical history significant for type 2 diabetes, hypertension, hyperlipidemia, history of breast cancer status post lumpectomy and chemo/radiation presenting today with 1 day history of significant right-sided chest pain.  She states the pain is much worse when she is taking a deep breath or coughing.  Very mild cough here and there the past couple days but states that is not unusual for her.  Denies fever, chills, shortness of breath, wheezing, abdominal pain, nausea vomiting or diarrhea.  So far not trying anything over-the-counter for symptoms.  No known history of diagnosed pulmonary disease.    Past Medical History:  Diagnosis Date   Arthritis    Breast cancer (HCC) 09/14/2013   Cancer (HCC) approx 2006   rt breast/lumpectomy/chemo/rad tx   Diabetes mellitus    High cholesterol    Hypertension    Shingles     Patient Active Problem List   Diagnosis Date Noted   Acute pulmonary edema (HCC) 05/16/2024   Uncontrolled type 2 diabetes mellitus with hyperglycemia, without long-term current use of insulin  (HCC) 05/16/2024   Essential hypertension 05/16/2024   Type 2 diabetes mellitus (HCC) 02/05/2023   Loose stools 11/30/2021   Encounter for colonoscopy in patient with family history of colon cancer 05/25/2019   Chest pain 05/25/2019   Unilateral primary osteoarthritis, right knee 02/27/2017   Unilateral primary osteoarthritis, left knee 02/27/2017   Breast cancer of lower-inner quadrant of right female breast (HCC) 09/14/2013   STRESS FRACTURE-SITE 11/19/2007   Sprain of ankle 11/19/2007    Past Surgical History:  Procedure Laterality Date   ABDOMINAL HYSTERECTOMY     BREAST SURGERY     CHOLECYSTECTOMY     COLONOSCOPY WITH PROPOFOL  N/A 04/03/2022   Procedure:  COLONOSCOPY WITH PROPOFOL ;  Surgeon: Eartha Angelia Sieving, MD;  Location: [facility];  Service: Gastroenterology;  Laterality: N/A;  1:00, per Anette Caldron pt knows to arrive at 9:00   IR RADIOLOGY PERIPHERAL GUIDED IV START  11/18/2019   IR US  GUIDE VASC ACCESS RIGHT  11/18/2019   POLYPECTOMY  04/03/2022   Procedure: POLYPECTOMY;  Surgeon: Eartha Angelia Sieving, MD;  Location: [facility];  Service: Gastroenterology;;    OB History     Gravida      Para      Term      Preterm      AB      Living  2      SAB      IAB      Ectopic      Multiple      Live Births               Home Medications    Prior to Admission medications   Medication Sig Start Date End Date Taking? Authorizing Provider  acetaminophen  (TYLENOL ) 500 MG tablet Take 500 mg by mouth every 8 (eight) hours as needed for moderate pain.    [provider]  adalimumab (HUMIRA, 2 PEN,) 40 MG/0.4ML pen Inject 0.4 mLs into the skin every 14 (fourteen) days.    [provider]  amLODipine  (NORVASC ) 10 MG tablet Take 10 mg by mouth daily.    [provider]  aspirin  EC 81 MG tablet Take 81 mg by  mouth daily.    [provider]  B Complex-C (B-COMPLEX WITH VITAMIN C) tablet Take 1 tablet by mouth daily.    [provider]  cetirizine  (ZYRTEC  ALLERGY) 10 MG tablet Take 1 tablet (10 mg total) by mouth daily. Patient taking differently: Take 10 mg by mouth as needed for allergies. 01/15/23   Christopher Savannah, PA-C  empagliflozin (JARDIANCE) 10 MG TABS tablet Take 10 mg by mouth daily. Patient not taking: Reported on 05/16/2024    [provider]  fluticasone  (FLONASE ) 50 MCG/ACT nasal spray Place 2 sprays into both nostrils daily. Patient taking differently: Place 2 sprays into both nostrils as needed for allergies. 02/22/23   Rodriguez-Southworth, Sylvia, PA-C  folic acid  (FOLVITE ) 1 MG tablet Take 1 mg by mouth daily.    [provider]  glimepiride   (AMARYL ) 1 MG tablet Take 1 mg by mouth daily with breakfast.  06/16/16   [provider]  hydrochlorothiazide  (HYDRODIURIL ) 25 MG tablet Take 25 mg by mouth daily.    [provider]  loperamide (IMODIUM A-D) 2 MG tablet Take 2-4 mg by mouth 4 (four) times daily as needed for diarrhea or loose stools.    [provider]  metFORMIN  (GLUCOPHAGE -XR) 500 MG 24 hr tablet Take 500 mg by mouth 2 (two) times daily.  08/29/16   [provider]  methotrexate (RHEUMATREX) 2.5 MG tablet Take 10 mg by mouth 2 (two) times a week. Thursdays and Fridays 08/15/16   [provider]  Multiple Vitamin (MULITIVITAMIN WITH MINERALS) TABS Take 1 tablet by mouth daily.    [provider]  olmesartan  (BENICAR ) 40 MG tablet Take 40 mg by mouth daily. 02/21/22   [provider]  pioglitazone (ACTOS) 15 MG tablet Take 15 mg by mouth daily. 03/26/24   [provider]  pravastatin  (PRAVACHOL ) 20 MG tablet Take 20 mg by mouth every evening.     [provider]  prednisoLONE acetate (PRED FORTE) 1 % ophthalmic suspension SMARTSIG:In Eye(s)    [provider]  spironolactone  (ALDACTONE ) 25 MG tablet Take 25 mg by mouth daily.    [provider]  traMADol  (ULTRAM ) 50 MG tablet Take 50 mg by mouth every 6 (six) hours as needed for moderate pain. Patient not taking: Reported on 05/16/2024    [provider]  Turmeric Curcumin 500 MG CAPS Take 500 mg by mouth daily.    [provider]  valACYclovir (VALTREX) 1000 MG tablet Take 1,000 mg by mouth daily.    [provider]    Family History History reviewed. No pertinent family history.  Social History Social History   Tobacco Use   Smoking status: Never   Smokeless tobacco: Never  Vaping Use   Vaping status: Never Used  Substance Use Topics   Alcohol use: No   Drug use: No     Allergies   Penicillins   Review of Systems Review of Systems Per  HPI  Physical Exam Triage Vital Signs ED Triage Vitals  Encounter Vitals Group     BP 05/15/24 1312 (!) 151/72     Girls Systolic BP Percentile --      Girls Diastolic BP Percentile --      Boys Systolic BP Percentile --      Boys Diastolic BP Percentile --      Pulse Rate 05/15/24 1312 70     Resp 05/15/24 1312 18     Temp 05/15/24 1312 98.7 F (37.1 C)  Temp Source 05/15/24 1312 Oral     SpO2 05/15/24 1312 91 %     Weight --      Height --      Head Circumference --      Peak Flow --      Pain Score 05/15/24 1315 8     Pain Loc --      Pain Education --      Exclude from Growth Chart --    No data found.  Updated Vital Signs BP (!) 151/72 (BP Location: Left Arm)   Pulse 70   Temp 98.7 F (37.1 C) (Oral)   Resp 18   SpO2 91%   Visual Acuity Right Eye Distance:   Left Eye Distance:   Bilateral Distance:    Right Eye Near:   Left Eye Near:    Bilateral Near:     Physical Exam Vitals and nursing note reviewed.  Constitutional:      Appearance: Normal appearance. She is not ill-appearing.  HENT:     Head: Atraumatic.     Nose: Nose normal.     Mouth/Throat:     Mouth: Mucous membranes are moist.  Eyes:     Extraocular Movements: Extraocular movements intact.     Conjunctiva/sclera: Conjunctivae normal.  Cardiovascular:     Rate and Rhythm: Normal rate and regular rhythm.     Heart sounds: Normal heart sounds.  Pulmonary:     Effort: Pulmonary effort is normal.     Breath sounds: No wheezing or rales.  Musculoskeletal:        General: Normal range of motion.     Cervical back: Normal range of motion and neck supple.  Skin:    General: Skin is warm and dry.  Neurological:     Mental Status: She is alert and oriented to person, place, and time.     Motor: No weakness.     Gait: Gait normal.  Psychiatric:        Mood and Affect: Mood normal.        Thought Content: Thought content normal.        Judgment: Judgment normal.      UC Treatments  / Results  Labs (all labs ordered are listed, but only abnormal results are displayed) Labs Reviewed - No data to display  EKG   Radiology ECHOCARDIOGRAM COMPLETE Result Date: 05/16/2024    ECHOCARDIOGRAM REPORT   Patient Name:   GIZEL RIEDLINGER Date of Exam: 05/16/2024 Medical Rec #:  [id number]       Height:       60.0 in Accession #:    [id number]      Weight:       187.0 lb Date of Birth:  10-09-1945        BSA:          1.814 m Patient Age:    78 years        BP:           137/71 mmHg Patient Gender: F               HR:           60 bpm. Exam Location:  Inpatient Procedure: 2D Echo, Color Doppler and Cardiac Doppler (Both Spectral and Color            Flow Doppler were utilized during procedure). Indications:    Dyspnea R06.00  History:        Patient has prior history  of Echocardiogram examinations, most                 recent 07/22/2019. Hx of breast cancer, Signs/Symptoms:Dyspnea                 and Shortness of Breath; Risk Factors:Hypertension and Diabetes.  Sonographer:    Koleen Popper RDCS Referring Phys: [id number] PROSPER M AMPONSAH IMPRESSIONS  1. Left ventricular ejection fraction, by estimation, is 70 to 75%. The left ventricle has hyperdynamic function. The left ventricle has no regional wall motion abnormalities. Left ventricular diastolic parameters are consistent with Grade I diastolic dysfunction (impaired relaxation).  2. Right ventricular systolic function was not well visualized. The right ventricular size is not well visualized. There is moderately elevated pulmonary artery systolic pressure.  3. The mitral valve is normal in structure. Trivial mitral valve regurgitation. No evidence of mitral stenosis.  4. The tricuspid valve is abnormal.  5. The aortic valve is tricuspid. Aortic valve regurgitation is not visualized. No aortic stenosis is present.  6. The inferior vena cava is normal in size with greater than 50% respiratory variability, suggesting right atrial pressure of 3 mmHg.  FINDINGS  Left Ventricle: Left ventricular ejection fraction, by estimation, is 70 to 75%. The left ventricle has hyperdynamic function. The left ventricle has no regional wall motion abnormalities. The left ventricular internal cavity size was normal in size. There is no left ventricular hypertrophy. Left ventricular diastolic parameters are consistent with Grade I diastolic dysfunction (impaired relaxation). Normal left ventricular filling pressure. Right Ventricle: RV not well visualized, grossly appears normal in size with normal function. The right ventricular size is not well visualized. Right vetricular wall thickness was not well visualized. Right ventricular systolic function was not well visualized. There is moderately elevated pulmonary artery systolic pressure. The tricuspid regurgitant velocity is 3.33 m/s, and with an assumed right atrial pressure of 3 mmHg, the estimated right ventricular systolic pressure is 47.4 mmHg. Left Atrium: Left atrial size was normal in size. Right Atrium: Right atrial size was normal in size. Pericardium: There is no evidence of pericardial effusion. Mitral Valve: The mitral valve is normal in structure. Trivial mitral valve regurgitation. No evidence of mitral valve stenosis. Tricuspid Valve: The tricuspid valve is abnormal. Tricuspid valve regurgitation is mild . No evidence of tricuspid stenosis. Aortic Valve: The aortic valve is tricuspid. Aortic valve regurgitation is not visualized. No aortic stenosis is present. Aortic valve mean gradient measures 9.0 mmHg. Aortic valve peak gradient measures 18.0 mmHg. Aortic valve area, by VTI measures 1.94  cm. Pulmonic Valve: The pulmonic valve was not well visualized. Pulmonic valve regurgitation is not visualized. No evidence of pulmonic stenosis. Aorta: The aortic root and ascending aorta are structurally normal, with no evidence of dilitation. Venous: The inferior vena cava is normal in size with greater than 50% respiratory  variability, suggesting right atrial pressure of 3 mmHg. IAS/Shunts: The interatrial septum was not well visualized.  LEFT VENTRICLE PLAX 2D LVIDd:         4.90 cm   Diastology LVIDs:         2.70 cm   LV e' medial:    6.74 cm/s LV PW:         1.00 cm   LV E/e' medial:  15.6 LV IVS:        0.90 cm   LV e' lateral:   10.90 cm/s LVOT diam:     1.80 cm   LV E/e' lateral: 9.6 LV SV:  85 LV SV Index:   47 LVOT Area:     2.54 cm  RIGHT VENTRICLE             IVC RV S prime:     15.10 cm/s  IVC diam: 1.60 cm TAPSE (M-mode): 2.8 cm LEFT ATRIUM             Index        RIGHT ATRIUM           Index LA diam:        4.20 cm 2.32 cm/m   RA Area:     17.10 cm LA Vol (A2C):   26.0 ml 14.33 ml/m  RA Volume:   49.40 ml  27.23 ml/m LA Vol (A4C):   45.4 ml 25.03 ml/m LA Biplane Vol: 37.2 ml 20.51 ml/m  AORTIC VALVE AV Area (Vmax):    1.78 cm AV Area (Vmean):   1.87 cm AV Area (VTI):     1.94 cm AV Vmax:           212.00 cm/s AV Vmean:          140.000 cm/s AV VTI:            0.437 m AV Peak Grad:      18.0 mmHg AV Mean Grad:      9.0 mmHg LVOT Vmax:         148.00 cm/s LVOT Vmean:        103.000 cm/s LVOT VTI:          0.333 m LVOT/AV VTI ratio: 0.76  AORTA Ao Root diam: 2.40 cm Ao Asc diam:  2.90 cm MITRAL VALVE                TRICUSPID VALVE MV Area (PHT): 2.76 cm     TR Peak grad:   44.4 mmHg MV Decel Time: 275 msec     TR Vmax:        333.00 cm/s MV E velocity: 105.00 cm/s MV A velocity: 109.00 cm/s  SHUNTS MV E/A ratio:  0.96         Systemic VTI:  0.33 m                             Systemic Diam: 1.80 cm Dorn Ross MD Electronically signed by Dorn Ross MD Signature Date/Time: 05/16/2024/12:15:11 PM    Final    CT Angio Chest PE W and/or Wo Contrast Result Date: 05/15/2024 CLINICAL DATA:  Positive D-dimer and chest pain, initial encounter EXAM: CT ANGIOGRAPHY CHEST WITH CONTRAST TECHNIQUE: Multidetector CT imaging of the chest was performed using the standard protocol during bolus administration of  intravenous contrast. Multiplanar CT image reconstructions and MIPs were obtained to evaluate the vascular anatomy. RADIATION DOSE REDUCTION: This exam was performed according to the departmental dose-optimization program which includes automated exposure control, adjustment of the mA and/or kV according to patient size and/or use of iterative reconstruction technique. CONTRAST:  75mL OMNIPAQUE  IOHEXOL  350 MG/ML SOLN COMPARISON:  Chest x-ray from earlier in the same day, CT from 12/09/2017. FINDINGS: Cardiovascular: Atherosclerotic calcifications of the thoracic aorta are noted. No aneurysmal dilatation or dissection is seen. The heart is mildly enlarged in size. The pulmonary artery shows a normal branching pattern bilaterally. No filling defect to suggest pulmonary embolism is noted. Scattered coronary calcifications are seen. Mediastinum/Nodes: Thyroid  shows enlargement of the left lobe of the thyroid  without discrete nodule consistent with a  goiter. This extends into the upper mediastinum stable in appearance from the prior exam. This has been previously evaluated with percutaneous biopsy on 05/14/2006. No specific follow-up is recommended. No hilar or mediastinal adenopathy is noted. The esophagus as visualized is within normal limits. Lungs/Pleura: Lungs are well aerated bilaterally with mild bibasilar atelectasis. Mild changes centrally are noted consistent with mild pulmonary edema. No sizable effusion is seen. A few subpleural nodules are noted in the right upper lobe stable from prior exam in 2019 and consistent with benign etiology. No specific follow-up is recommended. Upper Abdomen: Gallbladder has been surgically removed. No acute abnormality in the upper abdomen is noted. Musculoskeletal: Degenerative changes of the thoracic spine are noted. No rib abnormality is seen. Review of the MIP images confirms the above findings. IMPRESSION: No evidence of pulmonary embolism. 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 This is stable from 2019 and has been previously biopsied in 2007. No specific follow-up is recommended. Mild central pulmonary edema. This is similar to the recent chest x-ray. Stable subpleural nodules in the right upper lobe dating back to 2019. No follow-up is recommended. Aortic Atherosclerosis (ICD10-I70.0). Electronically Signed   By: Oneil Devonshire M.D.   On: 05/15/2024 21:49   DG Chest 2 View Result Date: 05/15/2024 EXAM: 2 VIEW(S) XRAY OF THE CHEST 05/15/2024 02:18:31 PM COMPARISON: 08/30/2020 CLINICAL HISTORY: Right chest pain x 1 day. FINDINGS: LUNGS AND PLEURA: Mildly low lung volumes. Unchanged eventration of the lateral right hemidiaphragm. Increased diffuse interstitial opacities. Mild thickening of the right lateral pleura. HEART AND MEDIASTINUM: No acute abnormality of the cardiac and mediastinal silhouettes. BONES AND SOFT TISSUES: No acute osseous abnormality. IMPRESSION: 1. Increased diffuse interstitial opacities, which may represent pulmonary edema . 2. Mild thickening of the right lateral pleura, which may represent small pleural effusion. Electronically signed by: Manford Cummins MD 05/15/2024 02:30 PM EDT RP Workstation: [id number]    Procedures Procedures (including critical care time)  Medications Ordered in UC Medications - No data to display  Initial Impression / Assessment and Plan / UC Course  I have reviewed the triage vital signs and the nursing notes.  Pertinent labs & imaging results that were available during my care of the patient were reviewed by me and considered in my medical decision making (see chart for details).     EKG declined today, hypertensive in triage but otherwise vital signs overall within normal limits.  Her oxygen saturation on room air is currently steady at about 91%, patient and patient's daughter endorsing that her typical baseline oxygen saturation is about 95%.  Chest x-ray today  revealing a right pleural effusion and pulmonary edema.  Given her new onset chest pain, reduced oxygen saturation from baseline and these findings do recommend further evaluation in the emergency department for a more thorough workup.  Patient and daughter are readily agreeable and wished to go via private vehicle.  EMS declined and do feel she is stable for private vehicle transport.  Final Clinical Impressions(s) / UC Diagnoses   Final diagnoses:  Right-sided chest pain  Pleural effusion on right  Acute pulmonary edema [facility])     Discharge Instructions      Your chest x-ray today showed a bit of fluid on your right lung and some possible pulmonary edema.  This could be indicative of an infection but could also have numerous other potential causes so given your active pain and decreased oxygen saturation from your baseline recommend  further evaluation into your symptoms in the emergency department.    ED Prescriptions   None    PDMP not reviewed this encounter.   Stuart Vernell Norris, [place] 05/16/24 1524

## 2024-05-17 DIAGNOSIS — I509 Heart failure, unspecified: Secondary | ICD-10-CM | POA: Diagnosis not present

## 2024-05-17 DIAGNOSIS — R079 Chest pain, unspecified: Secondary | ICD-10-CM | POA: Diagnosis not present

## 2024-05-17 LAB — BASIC METABOLIC PANEL WITH GFR
Anion gap: 10 (ref 5–15)
BUN: 26 mg/dL — ABNORMAL HIGH (ref 8–23)
CO2: 22 mmol/L (ref 22–32)
Calcium: 9.2 mg/dL (ref 8.9–10.3)
Chloride: 106 mmol/L (ref 98–111)
Creatinine, Ser: 1.11 mg/dL — ABNORMAL HIGH (ref 0.44–1.00)
GFR, Estimated: 51 mL/min — ABNORMAL LOW (ref >60)
Glucose, Bld: 146 mg/dL — ABNORMAL HIGH (ref 70–99)
Potassium: 3.8 mmol/L (ref 3.5–5.1)
Sodium: 138 mmol/L (ref 135–145)

## 2024-05-17 LAB — GLUCOSE, CAPILLARY: Glucose-Capillary: 173 mg/dL — ABNORMAL HIGH (ref 70–99)

## 2024-05-17 MED ORDER — LIDOCAINE 5 % EX PTCH: 1.0000 | MEDICATED_PATCH | CUTANEOUS | 0 refills | Status: DC

## 2024-05-17 MED ORDER — OLMESARTAN MEDOXOMIL 40 MG PO TABS: 40.0000 mg | ORAL_TABLET | Freq: Every day | ORAL | 0 refills | Status: AC

## 2024-05-17 MED ORDER — VITAMIN B-12 1000 MCG PO TABS
1000.0000 ug | ORAL_TABLET | Freq: Every day | ORAL | Status: DC
  Administered 2024-05-17: 1000 ug via ORAL
  Filled 2024-05-17: qty 1

## 2024-05-17 MED ORDER — CYANOCOBALAMIN 1000 MCG PO TABS: 1000.0000 ug | ORAL_TABLET | Freq: Every day | ORAL | 0 refills | Status: AC

## 2024-05-17 MED ORDER — DICLOFENAC SODIUM 1 % EX GEL: 2.0000 g | Freq: Four times a day (QID) | CUTANEOUS | 0 refills | Status: AC

## 2024-05-17 MED ORDER — AZITHROMYCIN 500 MG PO TABS: 500.0000 mg | ORAL_TABLET | Freq: Every day | ORAL | 0 refills | Status: DC

## 2024-05-17 MED ORDER — PANTOPRAZOLE SODIUM 40 MG PO TBEC: 40.0000 mg | DELAYED_RELEASE_TABLET | Freq: Every day | ORAL | 0 refills | Status: AC

## 2024-05-17 NOTE — Discharge Summary (Signed)
 Physician Discharge Summary   Patient: Bethany Ray MRN: [id number] DOB: [birth year]  Admit date:     05/15/2024  Discharge date: 05/17/24  Discharge Physician: Owen DELENA Lore   PCP: Sheryle Carwin, MD   Recommendations at discharge:    Needs Bmet to follow Renal function   Needs follow up with cardiology for Diastolic Dysfunction  And Hyperdynamic function.   Discharge Diagnoses: Principal Problem:   Chest pain Active Problems:   Acute pulmonary edema (HCC)   Uncontrolled type 2 diabetes mellitus with hyperglycemia, without long-term current use of insulin  (HCC)   Essential hypertension  Resolved Problems:   * No resolved hospital problems. *  Hospital Course: 78 year old with past medical history significant for rheumatoid arthritis on methotrexate, breast cancer status postsurgery, chemoradiation, osteoarthritis, diabetes type 2, hypertension, hyperlipidemia who presented to the ED for evaluation of chest pain right side worse in the morning of admission.  Pain also in the axillary area and back shoulder.  She also reports mild dyspnea on exertion and ankle with swelling.  Per daughter, patient lifted 6 packs of Coke cereal and Gatorade after they went to the grocery store, there is felt this was too heavy load for patient.   Patient was hypertensive systolic blood pressure 150, white blood cell 13, hemoglobin 11, D-dimer 1.5, troponin 4, BNP 45.  EKG normal sinus rhythm.  Chest x-ray pulmonary edema small pleural effusion.  CT chest PE negative, but showed small central pulmonary edema.    Assessment and Plan: 1-Chest pain Muscles skeletal component of pleuresis.  Report cough, leukocytosis. Added Azithromycin .  Tylenol  PRN, Lidocaine , Voltaren  gel.  ECHO; No wall motion abnormalities. Troponin negative.  CTA chest negative  for PE>   Acute Diastolic HF exacerbation.  Pulmonary edema: -Presents with Dyspnea, cough, CTA  with pulmonary edema. Oxygen 91 RA.  ECHO: Showed  diastolic dysfunction.  Change hydrochlorothiazide  to lasix .  Stable transition to oral lasix     Diabetes type 2: Hold Metformin  and Amaryl  while in patient. Resume at discharge  SSI   Hypertension: -Continue with Norvasc , spironolactone , ARB   Hyperlipidemia -Continue with Pravachol .    Rheumatoid arthritis Takes methotrexate twice weekly on Thursdays and Fridays           Consultants: None Procedures performed: ECHO Disposition: Home Diet recommendation:  Discharge Diet Orders (From admission, onward)     Start     Ordered   05/17/24 0000  Diet - low sodium heart healthy        05/17/24 0914           Cardiac diet DISCHARGE MEDICATION: Allergies as of 05/17/2024       Reactions   Penicillins Nausea And Vomiting   Childhood allergy Other Reaction(s): Not available Product containing penicillin and antibiotic (product)        Medication List     STOP taking these medications    hydrochlorothiazide  25 MG tablet Commonly known as: HYDRODIURIL    Jardiance 10 MG Tabs tablet Generic drug: empagliflozin   pioglitazone 15 MG tablet Commonly known as: ACTOS   traMADol  50 MG tablet Commonly known as: ULTRAM        TAKE these medications    acetaminophen  500 MG tablet Commonly known as: TYLENOL  Take 500 mg by mouth every 8 (eight) hours as needed for moderate pain.   amLODipine  10 MG tablet Commonly known as: NORVASC  Take 10 mg by mouth daily.   aspirin  EC 81 MG tablet Take 81 mg by mouth daily.  azithromycin  500 MG tablet Commonly known as: ZITHROMAX  Take 1 tablet (500 mg total) by mouth daily.   B-complex with vitamin C tablet Take 1 tablet by mouth daily.   cetirizine  10 MG tablet Commonly known as: ZyrTEC  Allergy Take 1 tablet (10 mg total) by mouth daily. What changed:  when to take this reasons to take this   diclofenac  Sodium 1 % Gel Commonly known as: VOLTAREN  Apply 2 g topically 4 (four) times daily.   fluticasone  50  MCG/ACT nasal spray Commonly known as: FLONASE  Place 2 sprays into both nostrils daily. What changed:  when to take this reasons to take this   folic acid  1 MG tablet Commonly known as: FOLVITE  Take 1 mg by mouth daily.   glimepiride  1 MG tablet Commonly known as: AMARYL  Take 1 mg by mouth daily with breakfast.   Humira (2 Pen) 40 MG/0.4ML pen Generic drug: adalimumab Inject 0.4 mLs into the skin every 14 (fourteen) days.   lidocaine  5 % Commonly known as: LIDODERM  Place 1 patch onto the skin daily. Remove & Discard patch within 12 hours or as directed by MD Start taking on: May 18, 2024   loperamide 2 MG tablet Commonly known as: IMODIUM A-D Take 2-4 mg by mouth 4 (four) times daily as needed for diarrhea or loose stools.   metFORMIN  500 MG 24 hr tablet Commonly known as: GLUCOPHAGE -XR Take 500 mg by mouth 2 (two) times daily.   methotrexate 2.5 MG tablet Commonly known as: RHEUMATREX Take 10 mg by mouth 2 (two) times a week. Thursdays and Fridays   multivitamin with minerals Tabs tablet Take 1 tablet by mouth daily.   olmesartan  40 MG tablet Commonly known as: BENICAR  Take 1 tablet (40 mg total) by mouth daily. Start taking on: May 18, 2024   pantoprazole  40 MG tablet Commonly known as: PROTONIX  Take 1 tablet (40 mg total) by mouth daily.   pravastatin  20 MG tablet Commonly known as: PRAVACHOL  Take 20 mg by mouth every evening.   prednisoLONE acetate 1 % ophthalmic suspension Commonly known as: PRED FORTE SMARTSIG:In Eye(s)   spironolactone  25 MG tablet Commonly known as: ALDACTONE  Take 25 mg by mouth daily.   Turmeric Curcumin 500 MG Caps Take 500 mg by mouth daily.   valACYclovir 1000 MG tablet Commonly known as: VALTREX Take 1,000 mg by mouth daily.        Follow-up Information     Sheryle Carwin, MD Follow up in 1 week(s).   Specialty: Internal Medicine Contact information: [address] [place]  [address] [phone]                Discharge Exam: Fredricka Weights   05/15/24 1840  Weight: 84.8 kg   General; NAD  Condition at discharge: stable  The results of significant diagnostics from this hospitalization (including imaging, microbiology, ancillary and laboratory) are listed below for reference.   Imaging Studies: ECHOCARDIOGRAM COMPLETE Result Date: 05/16/2024    ECHOCARDIOGRAM REPORT   Patient Name:   NITA WHITMIRE Date of Exam: 05/16/2024 Medical Rec #:  [id number]       Height:       60.0 in Accession #:    [id number]      Weight:       187.0 lb Date of Birth:  12-09-1945        BSA:          1.814 m Patient Age:    54 years  BP:           137/71 mmHg Patient Gender: F               HR:           60 bpm. Exam Location:  Inpatient Procedure: 2D Echo, Color Doppler and Cardiac Doppler (Both Spectral and Color            Flow Doppler were utilized during procedure). Indications:    Dyspnea R06.00  History:        Patient has prior history of Echocardiogram examinations, most                 recent 07/22/2019. Hx of breast cancer, Signs/Symptoms:Dyspnea                 and Shortness of Breath; Risk Factors:Hypertension and Diabetes.  Sonographer:    Koleen Popper RDCS Referring Phys: [id number] PROSPER M AMPONSAH IMPRESSIONS  1. Left ventricular ejection fraction, by estimation, is 70 to 75%. The left ventricle has hyperdynamic function. The left ventricle has no regional wall motion abnormalities. Left ventricular diastolic parameters are consistent with Grade I diastolic dysfunction (impaired relaxation).  2. Right ventricular systolic function was not well visualized. The right ventricular size is not well visualized. There is moderately elevated pulmonary artery systolic pressure.  3. The mitral valve is normal in structure. Trivial mitral valve regurgitation. No evidence of mitral stenosis.  4. The tricuspid valve is abnormal.  5. The aortic valve is tricuspid. Aortic valve  regurgitation is not visualized. No aortic stenosis is present.  6. The inferior vena cava is normal in size with greater than 50% respiratory variability, suggesting right atrial pressure of 3 mmHg. FINDINGS  Left Ventricle: Left ventricular ejection fraction, by estimation, is 70 to 75%. The left ventricle has hyperdynamic function. The left ventricle has no regional wall motion abnormalities. The left ventricular internal cavity size was normal in size. There is no left ventricular hypertrophy. Left ventricular diastolic parameters are consistent with Grade I diastolic dysfunction (impaired relaxation). Normal left ventricular filling pressure. Right Ventricle: RV not well visualized, grossly appears normal in size with normal function. The right ventricular size is not well visualized. Right vetricular wall thickness was not well visualized. Right ventricular systolic function was not well visualized. There is moderately elevated pulmonary artery systolic pressure. The tricuspid regurgitant velocity is 3.33 m/s, and with an assumed right atrial pressure of 3 mmHg, the estimated right ventricular systolic pressure is 47.4 mmHg. Left Atrium: Left atrial size was normal in size. Right Atrium: Right atrial size was normal in size. Pericardium: There is no evidence of pericardial effusion. Mitral Valve: The mitral valve is normal in structure. Trivial mitral valve regurgitation. No evidence of mitral valve stenosis. Tricuspid Valve: The tricuspid valve is abnormal. Tricuspid valve regurgitation is mild . No evidence of tricuspid stenosis. Aortic Valve: The aortic valve is tricuspid. Aortic valve regurgitation is not visualized. No aortic stenosis is present. Aortic valve mean gradient measures 9.0 mmHg. Aortic valve peak gradient measures 18.0 mmHg. Aortic valve area, by VTI measures 1.94  cm. Pulmonic Valve: The pulmonic valve was not well visualized. Pulmonic valve regurgitation is not visualized. No evidence of  pulmonic stenosis. Aorta: The aortic root and ascending aorta are structurally normal, with no evidence of dilitation. Venous: The inferior vena cava is normal in size with greater than 50% respiratory variability, suggesting right atrial pressure of 3 mmHg. IAS/Shunts: The interatrial septum was not well visualized.  LEFT VENTRICLE PLAX 2D LVIDd:         4.90 cm   Diastology LVIDs:         2.70 cm   LV e' medial:    6.74 cm/s LV PW:         1.00 cm   LV E/e' medial:  15.6 LV IVS:        0.90 cm   LV e' lateral:   10.90 cm/s LVOT diam:     1.80 cm   LV E/e' lateral: 9.6 LV SV:         85 LV SV Index:   47 LVOT Area:     2.54 cm  RIGHT VENTRICLE             IVC RV S prime:     15.10 cm/s  IVC diam: 1.60 cm TAPSE (M-mode): 2.8 cm LEFT ATRIUM             Index        RIGHT ATRIUM           Index LA diam:        4.20 cm 2.32 cm/m   RA Area:     17.10 cm LA Vol (A2C):   26.0 ml 14.33 ml/m  RA Volume:   49.40 ml  27.23 ml/m LA Vol (A4C):   45.4 ml 25.03 ml/m LA Biplane Vol: 37.2 ml 20.51 ml/m  AORTIC VALVE AV Area (Vmax):    1.78 cm AV Area (Vmean):   1.87 cm AV Area (VTI):     1.94 cm AV Vmax:           212.00 cm/s AV Vmean:          140.000 cm/s AV VTI:            0.437 m AV Peak Grad:      18.0 mmHg AV Mean Grad:      9.0 mmHg LVOT Vmax:         148.00 cm/s LVOT Vmean:        103.000 cm/s LVOT VTI:          0.333 m LVOT/AV VTI ratio: 0.76  AORTA Ao Root diam: 2.40 cm Ao Asc diam:  2.90 cm MITRAL VALVE                TRICUSPID VALVE MV Area (PHT): 2.76 cm     TR Peak grad:   44.4 mmHg MV Decel Time: 275 msec     TR Vmax:        333.00 cm/s MV E velocity: 105.00 cm/s MV A velocity: 109.00 cm/s  SHUNTS MV E/A ratio:  0.96         Systemic VTI:  0.33 m                             Systemic Diam: 1.80 cm Dorn Ross MD Electronically signed by Dorn Ross MD Signature Date/Time: 05/16/2024/12:15:11 PM    Final    CT Angio Chest PE W and/or Wo Contrast Result Date: 05/15/2024 CLINICAL DATA:  Positive  D-dimer and chest pain, initial encounter EXAM: CT ANGIOGRAPHY CHEST WITH CONTRAST TECHNIQUE: Multidetector CT imaging of the chest was performed using the standard protocol during bolus administration of intravenous contrast. Multiplanar CT image reconstructions and MIPs were obtained to evaluate the vascular anatomy. RADIATION DOSE REDUCTION: This exam was performed according to the departmental dose-optimization program which includes automated exposure  control, adjustment of the mA and/or kV according to patient size and/or use of iterative reconstruction technique. CONTRAST:  75mL OMNIPAQUE  IOHEXOL  350 MG/ML SOLN COMPARISON:  Chest x-ray from earlier in the same day, CT from 12/09/2017. FINDINGS: Cardiovascular: Atherosclerotic calcifications of the thoracic aorta are noted. No aneurysmal dilatation or dissection is seen. The heart is mildly enlarged in size. The pulmonary artery shows a normal branching pattern bilaterally. No filling defect to suggest pulmonary embolism is noted. Scattered coronary calcifications are seen. Mediastinum/Nodes: Thyroid  shows enlargement of the left lobe of the thyroid  without discrete nodule consistent with a goiter. This extends into the upper mediastinum stable in appearance from the prior exam. This has been previously evaluated with percutaneous biopsy on 05/14/2006. No specific follow-up is recommended. No hilar or mediastinal adenopathy is noted. The esophagus as visualized is within normal limits. Lungs/Pleura: Lungs are well aerated bilaterally with mild bibasilar atelectasis. Mild changes centrally are noted consistent with mild pulmonary edema. No sizable effusion is seen. A few subpleural nodules are noted in the right upper lobe stable from prior exam in 2019 and consistent with benign etiology. No specific follow-up is recommended. Upper Abdomen: Gallbladder has been surgically removed. No acute abnormality in the upper abdomen is noted. Musculoskeletal:  Degenerative changes of the thoracic spine are noted. No rib abnormality is seen. Review of the MIP images confirms the above findings. IMPRESSION: No evidence of pulmonary embolism. 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 This is stable from 2019 and has been previously biopsied in 2007. No specific follow-up is recommended. Mild central pulmonary edema. This is similar to the recent chest x-ray. Stable subpleural nodules in the right upper lobe dating back to 2019. No follow-up is recommended. Aortic Atherosclerosis (ICD10-I70.0). Electronically Signed   By: Oneil Devonshire M.D.   On: 05/15/2024 21:49   DG Chest 2 View Result Date: 05/15/2024 EXAM: 2 VIEW(S) XRAY OF THE CHEST 05/15/2024 02:18:31 PM COMPARISON: 08/30/2020 CLINICAL HISTORY: Right chest pain x 1 day. FINDINGS: LUNGS AND PLEURA: Mildly low lung volumes. Unchanged eventration of the lateral right hemidiaphragm. Increased diffuse interstitial opacities. Mild thickening of the right lateral pleura. HEART AND MEDIASTINUM: No acute abnormality of the cardiac and mediastinal silhouettes. BONES AND SOFT TISSUES: No acute osseous abnormality. IMPRESSION: 1. Increased diffuse interstitial opacities, which may represent pulmonary edema . 2. Mild thickening of the right lateral pleura, which may represent small pleural effusion. Electronically signed by: Manford Cummins MD 05/15/2024 02:30 PM EDT RP Workstation: [id number]    Microbiology: Results for orders placed or performed during the hospital encounter of 02/22/23  Culture, group A strep     Status: None   Collection Time: 02/22/23  8:40 AM   Specimen: Throat  Result Value Ref Range Status   Specimen Description   Final    THROAT Performed at [facility], [address], [place], [place] [address]    Special Requests   Final    NONE Performed at [facility], [address], [place], [place] [address]    Culture   Final    NO GROUP A STREP  (S.PYOGENES) ISOLATED Performed at [facility], [address] [place], [place] [address]    Report Status 02/25/2023 FINAL  Final  SARS CORONAVIRUS 2 (TAT 6-24 HRS) Anterior Nasal Swab     Status: None   Collection Time: 02/22/23  8:41 AM   Specimen: Anterior Nasal Swab  Result Value Ref Range Status  SARS Coronavirus 2 NEGATIVE NEGATIVE Final    Comment: (NOTE) SARS-CoV-2 target nucleic acids are NOT DETECTED.  The SARS-CoV-2 RNA is generally detectable in upper and lower respiratory specimens during the acute phase of infection. Negative results do not preclude SARS-CoV-2 infection, do not rule out co-infections with other pathogens, and should not be used as the sole basis for treatment or other patient management decisions. Negative results must be combined with clinical observations, patient history, and epidemiological information. The expected result is Negative.  Fact Sheet for Patients: HairSlick.no  Fact Sheet for Healthcare Providers: quierodirigir.com  This test is not yet approved or cleared by the [place]  FDA and  has been authorized for detection and/or diagnosis of SARS-CoV-2 by FDA under an Emergency Use Authorization (EUA). This EUA will remain  in effect (meaning this test can be used) for the duration of the COVID-19 declaration under Se ction 564(b)(1) of the Act, 21 U.S.C. section 360bbb-3(b)(1), unless the authorization is terminated or revoked sooner.  Performed at [facility], [address] [place], [place] [address]     Labs: CBC: Recent Labs  Lab 05/15/24 1853 05/16/24 0451  WBC 13.6* 12.6*  HGB 11.7* 10.9*  HCT 36.2 33.9*  MCV 98.1 97.4  PLT 283 261   Basic Metabolic Panel: Recent Labs  Lab 05/15/24 2033 05/16/24 0451 05/16/24 0959 05/17/24 0349  NA 136 136  --  138  K 4.0 3.8  --  3.8  CL 103 103  --  106  CO2 24 23  --  22  GLUCOSE 183* 204*  --   146*  BUN 19 19  --  26*  CREATININE 0.87 0.89  --  1.11*  CALCIUM 9.5 9.6  --  9.2  MG  --   --  1.9  --    Liver Function Tests: Recent Labs  Lab 05/16/24 0959  AST 24  ALT 22  ALKPHOS 67  BILITOT 0.7  PROT 7.5  ALBUMIN 3.5   CBG: Recent Labs  Lab 05/16/24 0737 05/16/24 1127 05/16/24 1612 05/16/24 2026 05/17/24 0743  GLUCAP 174* 184* 122* 170* 173*    Discharge time spent: greater than 30 minutes.  Signed: Owen DELENA Lore, MD [facility] 05/17/2024

## 2024-05-17 NOTE — Discharge Instructions (Signed)
 Follow low salt diet, less than 2 gram of sodium in 24 hours.  Follow  fluids restriction  Try to drink only 1.5 to less than  2 L.   Your Hydrochlorothiazide  was change to lasix , to help prevent fluid accumulation.  Resume benicar  8/11.   You need Lab work to check electrolytes and renal function next week.

## 2024-05-18 LAB — HEMOGLOBIN A1C
Hgb A1c MFr Bld: 7 % — ABNORMAL HIGH (ref 4.8–5.6)
Mean Plasma Glucose: 154 mg/dL

## 2024-05-22 DIAGNOSIS — R079 Chest pain, unspecified: Secondary | ICD-10-CM | POA: Diagnosis not present

## 2024-05-22 DIAGNOSIS — I5032 Chronic diastolic (congestive) heart failure: Secondary | ICD-10-CM | POA: Diagnosis not present

## 2024-05-22 DIAGNOSIS — E1122 Type 2 diabetes mellitus with diabetic chronic kidney disease: Secondary | ICD-10-CM | POA: Diagnosis not present

## 2024-06-10 DIAGNOSIS — H1789 Other corneal scars and opacities: Secondary | ICD-10-CM | POA: Diagnosis not present

## 2024-06-10 DIAGNOSIS — I5032 Chronic diastolic (congestive) heart failure: Secondary | ICD-10-CM | POA: Diagnosis not present

## 2024-06-10 DIAGNOSIS — H40051 Ocular hypertension, right eye: Secondary | ICD-10-CM | POA: Diagnosis not present

## 2024-06-10 DIAGNOSIS — R079 Chest pain, unspecified: Secondary | ICD-10-CM | POA: Diagnosis not present

## 2024-06-10 DIAGNOSIS — E119 Type 2 diabetes mellitus without complications: Secondary | ICD-10-CM | POA: Diagnosis not present

## 2024-06-10 DIAGNOSIS — E1122 Type 2 diabetes mellitus with diabetic chronic kidney disease: Secondary | ICD-10-CM | POA: Diagnosis not present

## 2024-06-10 DIAGNOSIS — Z961 Presence of intraocular lens: Secondary | ICD-10-CM | POA: Diagnosis not present

## 2024-06-11 ENCOUNTER — Ambulatory Visit: Admitting: Internal Medicine

## 2024-06-17 ENCOUNTER — Telehealth: Payer: Self-pay | Admitting: Internal Medicine

## 2024-06-17 DIAGNOSIS — M0579 Rheumatoid arthritis with rheumatoid factor of multiple sites without organ or systems involvement: Secondary | ICD-10-CM | POA: Diagnosis not present

## 2024-06-17 DIAGNOSIS — M170 Bilateral primary osteoarthritis of knee: Secondary | ICD-10-CM | POA: Diagnosis not present

## 2024-06-17 DIAGNOSIS — R609 Edema, unspecified: Secondary | ICD-10-CM

## 2024-06-17 DIAGNOSIS — M7989 Other specified soft tissue disorders: Secondary | ICD-10-CM | POA: Diagnosis not present

## 2024-06-17 DIAGNOSIS — E1169 Type 2 diabetes mellitus with other specified complication: Secondary | ICD-10-CM | POA: Diagnosis not present

## 2024-06-17 DIAGNOSIS — I5189 Other ill-defined heart diseases: Secondary | ICD-10-CM | POA: Diagnosis not present

## 2024-06-17 DIAGNOSIS — Z79899 Other long term (current) drug therapy: Secondary | ICD-10-CM | POA: Diagnosis not present

## 2024-06-17 DIAGNOSIS — M25569 Pain in unspecified knee: Secondary | ICD-10-CM | POA: Diagnosis not present

## 2024-06-17 DIAGNOSIS — J811 Chronic pulmonary edema: Secondary | ICD-10-CM | POA: Diagnosis not present

## 2024-06-17 DIAGNOSIS — E785 Hyperlipidemia, unspecified: Secondary | ICD-10-CM | POA: Diagnosis not present

## 2024-06-17 NOTE — Telephone Encounter (Signed)
 Patient stated that she was at her Rheumatologist today and notice the swelling in her legs and feet. Patient states that he has been taking the furosemide  when she was discharged from the hospital 20 mg once daily. She feels maybe he has gained 1-2 pounds. Reported no changes in her diet or salt intake. Also reported no changes since she has started taking the furosemide  either. She elevates her feet and when she gets back up at times they are hurting. Elevating them does help. Reports short of breath at times when moving around . BP today 124/68. Patient is wanting a sooner appointment advised her that we can place her on the wait list. If her symptoms worsen can go to ER. Patient verbalized understanding. Routed to provider

## 2024-06-17 NOTE — Telephone Encounter (Signed)
 Pt c/o swelling/edema: STAT if pt has developed SOB within 24 hours  If swelling, where is the swelling located? Both feet and legs  How much weight have you gained and in what time span? A few pounds heavier in the beginning of August  Have you gained 2 pounds in a day or 5 pounds in a week? No  Do you have a log of your daily weights (if so, list)? No  Are you currently taking a fluid pill? Yes, Lasix   Are you currently SOB? No, every once in a while  Have you traveled recently in a car or plane for an extended period of time? No  Pt's rheumatologist recommended pt speak with cardiologist regarding swelling in feet and legs

## 2024-06-18 NOTE — Telephone Encounter (Signed)
 Per Dr. Mallipeddi:  Echo normal. Obtain BNP. Continue to use compression socks.   Patient stated that she has been wearing compression stockings and swelling not as bad today. Will go over to [facility] tomorrow to have completed

## 2024-06-24 ENCOUNTER — Telehealth: Payer: Self-pay | Admitting: Cardiology

## 2024-06-24 DIAGNOSIS — R609 Edema, unspecified: Secondary | ICD-10-CM | POA: Diagnosis not present

## 2024-06-24 NOTE — Telephone Encounter (Signed)
 Spoke to patients daughter who stated that she took pt to have BNP drawn today and noticed a red patch with a blue bruise on patient's leg. Daughter stated that patients calf/ankles/feet are swollen, but left leg is worse than the right let. Legs look shiny from fluid build up, but no leaking was noted. Pt did not have compression stockings, but has compression socks- advised daughter that patient would benefit from stockings and daughter stated she would get stockings for patient. Daughter stated that patient took her lasix  this morning with her morning medications, but admitted that patient at beef sausage and drank 2 16 oz bottles of water. Pt weighed herself today and weight was 185.8 lb.   Please advise.

## 2024-06-24 NOTE — Telephone Encounter (Signed)
 Daughter Harlan) called to report patient had lab drawn today and a blue spot was noted on patient's left leg and patient ankle and leg was swollen.  Daughter stated she will take patient to Urgent Care or ED if patient gets a fever.

## 2024-06-25 LAB — BRAIN NATRIURETIC PEPTIDE: BNP: 27.6 pg/mL (ref 0.0–100.0)

## 2024-06-29 ENCOUNTER — Ambulatory Visit: Payer: Self-pay | Admitting: Internal Medicine

## 2024-06-29 NOTE — Telephone Encounter (Signed)
 Noted

## 2024-06-30 ENCOUNTER — Encounter: Payer: Self-pay | Admitting: Cardiology

## 2024-06-30 ENCOUNTER — Ambulatory Visit: Attending: Cardiology | Admitting: Cardiology

## 2024-06-30 VITALS — BP 144/72 | HR 60 | Ht 60.0 in | Wt 189.4 lb

## 2024-06-30 DIAGNOSIS — I5033 Acute on chronic diastolic (congestive) heart failure: Secondary | ICD-10-CM

## 2024-06-30 DIAGNOSIS — R609 Edema, unspecified: Secondary | ICD-10-CM | POA: Diagnosis not present

## 2024-06-30 DIAGNOSIS — I10 Essential (primary) hypertension: Secondary | ICD-10-CM

## 2024-06-30 MED ORDER — FUROSEMIDE 40 MG PO TABS: 40.0000 mg | ORAL_TABLET | Freq: Every day | ORAL | 3 refills | Status: DC

## 2024-06-30 NOTE — Patient Instructions (Signed)
 Medication Instructions:  Your physician has recommended you make the following change in your medication:   -Increase Lasix  to 40 mg once daily   *If you need a refill on your cardiac medications before your next appointment, please call your pharmacy*  Lab Work: In 1 week:  -BMET -MAG  If you have labs (blood work) drawn today and your tests are completely normal, you will receive your results only by: MyChart Message (if you have MyChart) OR A paper copy in the mail If you have any lab test that is abnormal or we need to change your treatment, we will call you to review the results.  Testing/Procedures: None  Follow-Up: At [facility], you and your health needs are our priority.  As part of our continuing mission to provide you with exceptional heart care, our providers are all part of one team.  This team includes your primary Cardiologist (physician) and Advanced Practice Providers or APPs (Physician Assistants and Nurse Practitioners) who all work together to provide you with the care you need, when you need it.  Your next appointment:   3-4 week(s)  Provider:   You may see Alvan Carrier, MD or one of the following Advanced Practice Providers on your designated Care Team:   Laymon Qua, PA-C  [place], [place] Olivia [place]avy, [place]     We recommend signing up for the patient portal called MyChart.  Sign up information is provided on this After Visit Summary.  MyChart is used to connect with patients for Virtual Visits (Telemedicine).  Patients are able to view lab/test results, encounter notes, upcoming appointments, etc.  Non-urgent messages can be sent to your provider as well.   To learn more about what you can do with MyChart, go to [email or web address].   Other Instructions

## 2024-06-30 NOTE — Progress Notes (Signed)
 Clinical Summary Bethany Ray is a 78 y.o.female former patient of Dr Charls, last seen 01/2020. Seen today as a new patient in my clinic for the following medical problems.   1.Chronic HFpEF -new diagnosis during 05/2024 admission - noted leg edema.CXR and CT suggestive of pulm edema.  BNP 44 - 05/2024 echo: LVEF 70-75%, no MWAs, grade I dd, normal RV function, mod pulm HTN - started on lasix  20mg  daily  - ongoing swelling in legs since discharge - occasional SOB/DOE, improved from August - compliant with meds. Reports has been on norvasc  10mg  for several years.   2. Chest pain - long history -05/2024 CT PE was negtive for PE - admit 05/2024 with chest pain, thought noncardiac. She also complated of cough, had elevated WBC. Treated with azithro. Also onset of symptoms after lifiting heavy box, area tender to palpation.  - chest pain has improved, still some positional pains at times.   3. Rheumatoid arthritis   4.HLD  5. HTN - compliant with meds.  Past Medical History:  Diagnosis Date   Arthritis    Breast cancer (HCC) 09/14/2013   Cancer (HCC) approx 2006   rt breast/lumpectomy/chemo/rad tx   Diabetes mellitus    High cholesterol    Hypertension    Shingles      Allergies  Allergen Reactions   Penicillins Nausea And Vomiting    Childhood allergy  Other Reaction(s): Not available  Product containing penicillin and antibiotic (product)     Current Outpatient Medications  Medication Sig Dispense Refill   acetaminophen  (TYLENOL ) 500 MG tablet Take 500 mg by mouth every 8 (eight) hours as needed for moderate pain.     amLODipine  (NORVASC ) 10 MG tablet Take 10 mg by mouth daily.     aspirin  EC 81 MG tablet Take 81 mg by mouth daily.     cyanocobalamin  1000 MCG tablet Take 1 tablet (1,000 mcg total) by mouth daily. 30 tablet 0   diclofenac  Sodium (VOLTAREN ) 1 % GEL Apply 2 g topically 4 (four) times daily. 20 g 0   fluticasone  (FLONASE ) 50 MCG/ACT nasal  spray Place 2 sprays into both nostrils daily. 16 g 0   folic acid  (FOLVITE ) 1 MG tablet Take 1 mg by mouth daily.     glimepiride  (AMARYL ) 1 MG tablet Take 1 mg by mouth daily with breakfast.      lidocaine  (LIDODERM ) 5 % Place 1 patch onto the skin daily. Remove & Discard patch within 12 hours or as directed by MD 30 patch 0   loperamide (IMODIUM A-D) 2 MG tablet Take 2-4 mg by mouth 4 (four) times daily as needed for diarrhea or loose stools.     metFORMIN  (GLUCOPHAGE -XR) 500 MG 24 hr tablet Take 500 mg by mouth 2 (two) times daily.      methotrexate (RHEUMATREX) 2.5 MG tablet Take 10 mg by mouth 2 (two) times a week. Thursdays and Fridays     Multiple Vitamin (MULITIVITAMIN WITH MINERALS) TABS Take 1 tablet by mouth daily.     olmesartan  (BENICAR ) 40 MG tablet Take 1 tablet (40 mg total) by mouth daily. 30 tablet 0   pantoprazole  (PROTONIX ) 40 MG tablet Take 1 tablet (40 mg total) by mouth daily. 30 tablet 0   pravastatin  (PRAVACHOL ) 20 MG tablet Take 20 mg by mouth every evening.      spironolactone  (ALDACTONE ) 25 MG tablet Take 25 mg by mouth daily.     Turmeric Curcumin 500 MG CAPS  Take 500 mg by mouth daily.     valACYclovir (VALTREX) 1000 MG tablet Take 1,000 mg by mouth daily.     adalimumab (HUMIRA, 2 PEN,) 40 MG/0.4ML pen Inject 0.4 mLs into the skin every 14 (fourteen) days. (Patient not taking: Reported on 06/30/2024)     cetirizine  (ZYRTEC  ALLERGY) 10 MG tablet Take 1 tablet (10 mg total) by mouth daily. (Patient not taking: Reported on 06/30/2024) 30 tablet 0   No current facility-administered medications for this visit.     Past Surgical History:  Procedure Laterality Date   ABDOMINAL HYSTERECTOMY     BREAST SURGERY     CHOLECYSTECTOMY     COLONOSCOPY WITH PROPOFOL  N/A 04/03/2022   Procedure: COLONOSCOPY WITH PROPOFOL ;  Surgeon: Eartha Angelia Sieving, MD;  Location: [facility];  Service: Gastroenterology;  Laterality: N/A;  1:00, per Anette Caldron pt knows to arrive at  9:00   IR RADIOLOGY PERIPHERAL GUIDED IV START  11/18/2019   IR US  GUIDE VASC ACCESS RIGHT  11/18/2019   POLYPECTOMY  04/03/2022   Procedure: POLYPECTOMY;  Surgeon: Eartha Angelia Sieving, MD;  Location: [facility];  Service: Gastroenterology;;     Allergies  Allergen Reactions   Penicillins Nausea And Vomiting    Childhood allergy  Other Reaction(s): Not available  Product containing penicillin and antibiotic (product)      No family history on file.   Social History Bethany Ray reports that she has never smoked. She has never used smokeless tobacco. Bethany Ray reports no history of alcohol use.     Physical Examination Vitals:   06/30/24 1032 06/30/24 1112  BP: (!) 144/70 (!) 144/72  Pulse: 60   SpO2: 94%    Filed Weights   06/30/24 1032  Weight: 189 lb 6.4 oz (85.9 kg)    Gen: resting comfortably, no acute distress HEENT: no scleral icterus, pupils equal round and reactive, no palptable cervical adenopathy,  CV: RRR, 3/6 systolic murmur rusb, no jvd Resp: Clear to auscultation bilaterally GI: abdomen is soft, non-tender, non-distended, normal bowel sounds, no hepatosplenomegaly MSK: extremities are warm, no edema.  Skin: warm, no rash Neuro:  no focal deficits Psych: appropriate affect   Diagnostic Studies  Echocardiogram 07/22/2019:    1. Left ventricular ejection fraction, by visual estimation, is 70 to 75%. The left ventricle has hyperdynamic function. Normal left ventricular size. There is mildly increased left ventricular hypertrophy.  2. Global right ventricle has normal systolic function.The right ventricular size is normal. No increase in right ventricular wall thickness.  3. Left atrial size was normal.  4. Right atrial size was normal.  5. Mild aortic valve annular calcification.  6. Mild mitral annular calcification.  7. The mitral valve is grossly normal. Trace mitral valve regurgitation.  8. The tricuspid valve is grossly normal.  Tricuspid valve regurgitation is trivial.  9. The aortic valve is tricuspid Aortic valve regurgitation was not visualized by color flow Doppler. 10. The pulmonic valve was grossly normal. Pulmonic valve regurgitation is trivial by color flow Doppler. 11. Mildly elevated pulmonary artery systolic pressure. 12. The tricuspid regurgitant velocity is 2.93 m/s, and with an assumed right atrial pressure of 3 mmHg, the estimated right ventricular systolic pressure is mildly elevated at 37.3 mmHg. 13. The inferior vena cava is normal in size with greater than 50% respiratory variability, suggesting right atrial pressure of 3 mmHg.     Coronary CT angiography 11/18/2019:   Pulmonary veins drain normally to the left atrium. No LA appendage  thrombus noted.   Calcium Score: 0 Agatston units.   Coronary Arteries: Right dominant with no anomalies   LM: No plaque or stenosis.   LAD system:  No plaque or stenosis.   Circumflex system: No plaque or stenosis.   RCA system: No plaque or stenosis.   IMPRESSION: 1. Coronary artery calcium score 0 Agatston units, suggesting low risk for future cardiac events.   2.  No significant coronary disease noted.   Assessment and Plan   1.Acute on chronic HFpEF - remains volume overloaded, increase lasix  to 40mg  daily - check bmet/mg in 1 week. Recent BNP likely not reflective of volume status given her HFpEF and obesity - given presciption for compression stockings.  - likely add SGLT2i at f/u  2. HTN - elevated today, follow with diuresis before making long term adjustements to her HTN regimen - of note on norvasc  10mg  several years, if LE edema does not improve with diuresis may consider changing norvasc     F/u 3-4 weeks reassess volume status    Dorn PHEBE Ross, M.D.

## 2024-06-30 NOTE — Telephone Encounter (Signed)
-----   Message from [facility] Boomer B sent at 06/29/2024  5:00 PM EDT -----  ----- Message ----- From: Stacia Diannah SQUIBB, MD Sent: 06/29/2024   4:58 PM EDT To: Lurena Hershal Bathe Triage  Normal BNP.  ----- Message ----- From: Rebecka Memos Lab Results In Sent: 06/25/2024   3:36 PM EDT To: Vishnu P Mallipeddi, MD

## 2024-06-30 NOTE — Telephone Encounter (Signed)
 The patient has been notified of the result and verbalized understanding.  All questions (if any) were answered. Littie CHRISTELLA Croak, CMA 06/30/2024 10:14 AM

## 2024-07-07 DIAGNOSIS — Z1231 Encounter for screening mammogram for malignant neoplasm of breast: Secondary | ICD-10-CM | POA: Diagnosis not present

## 2024-07-15 DIAGNOSIS — Z90710 Acquired absence of both cervix and uterus: Secondary | ICD-10-CM | POA: Diagnosis not present

## 2024-07-15 DIAGNOSIS — M6208 Separation of muscle (nontraumatic), other site: Secondary | ICD-10-CM | POA: Diagnosis not present

## 2024-07-15 DIAGNOSIS — Z01419 Encounter for gynecological examination (general) (routine) without abnormal findings: Secondary | ICD-10-CM | POA: Diagnosis not present

## 2024-07-15 DIAGNOSIS — Z853 Personal history of malignant neoplasm of breast: Secondary | ICD-10-CM | POA: Diagnosis not present

## 2024-07-22 DIAGNOSIS — E785 Hyperlipidemia, unspecified: Secondary | ICD-10-CM | POA: Diagnosis not present

## 2024-07-22 DIAGNOSIS — Z79899 Other long term (current) drug therapy: Secondary | ICD-10-CM | POA: Diagnosis not present

## 2024-07-22 DIAGNOSIS — E1129 Type 2 diabetes mellitus with other diabetic kidney complication: Secondary | ICD-10-CM | POA: Diagnosis not present

## 2024-07-22 DIAGNOSIS — I10 Essential (primary) hypertension: Secondary | ICD-10-CM | POA: Diagnosis not present

## 2024-07-28 DIAGNOSIS — I5032 Chronic diastolic (congestive) heart failure: Secondary | ICD-10-CM | POA: Diagnosis not present

## 2024-07-28 DIAGNOSIS — E1122 Type 2 diabetes mellitus with diabetic chronic kidney disease: Secondary | ICD-10-CM | POA: Diagnosis not present

## 2024-07-28 DIAGNOSIS — R051 Acute cough: Secondary | ICD-10-CM | POA: Diagnosis not present

## 2024-07-28 NOTE — Progress Notes (Unsigned)
 Cardiology Office Note    Date:  07/29/2024  ID:  Bethany Ray, Bethany Ray [birth year], MRN [id number] Cardiologist: Alvan Carrier, MD { :  History of Present Illness:    Bethany Ray is a 78 y.o. female with past medical history of chronic HFpEF, history of chest pain (coronary CTA in 11/2019 showing coronary calcium score of 0 and no significant CAD), HTN, HLD and rheumatoid arthritis who presents to the office today for 1 month follow-up.  She was last examined by Dr. Alvan on 06/30/2024 and reported worsening lower extremity edema over the past month with occasional shortness of breath and dyspnea on exertion. Recent echocardiogram had shown a hyperdynamic LVEF of 70 to 75% with grade 1 diastolic dysfunction and the RV was poorly visualized. She had been taking Lasix  20 mg daily since her hospitalization in 05/2024 and this was titrated to 40 mg daily. Was recommended to consider the addition of an SGLT2 inhibitor at follow-up. Was also recommended that if lower extremity edema did not improve with diuresis, would consider changing Amlodipine  as she was on 10 mg daily.  In talking with the patient today, she reports her lower extremity edema has significantly improved since her last office visit. She does report frequent urination with Lasix  40 mg daily and says she is going to the bathroom every 10 to 15 minutes after she takes her morning dose for a few hours. She denies any specific dyspnea on exertion, orthopnea or PND. Reports occasional episodes of left-sided chest pain which she describes as a pins-and-needles sensation which only lasts for a few seconds and goes away. No pressure, tightness or symptoms associated with exertion. She currently has a cold at this time and was evaluated by her PCP yesterday and prescribed Tessalon  given her frequent coughing. She does most of her own cooking but does add salt occasionally.  Studies Reviewed:   EKG: EKG is not ordered today.  Coronary  CTA: 11/2019 IMPRESSION: 1. Coronary artery calcium score 0 Agatston units, suggesting low risk for future cardiac events.   2.  No significant coronary disease noted.  Echocardiogram: 05/2024 IMPRESSIONS     1. Left ventricular ejection fraction, by estimation, is 70 to 75%. The  left ventricle has hyperdynamic function. The left ventricle has no  regional wall motion abnormalities. Left ventricular diastolic parameters  are consistent with Grade I diastolic  dysfunction (impaired relaxation).   2. Right ventricular systolic function was not well visualized. The right  ventricular size is not well visualized. There is moderately elevated  pulmonary artery systolic pressure.   3. The mitral valve is normal in structure. Trivial mitral valve  regurgitation. No evidence of mitral stenosis.   4. The tricuspid valve is abnormal.   5. The aortic valve is tricuspid. Aortic valve regurgitation is not  visualized. No aortic stenosis is present.   6. The inferior vena cava is normal in size with greater than 50%  respiratory variability, suggesting right atrial pressure of 3 mmHg.   Physical Exam:   VS:  BP 132/74   Pulse 60   Ht 5' (1.524 m)   Wt 189 lb 9.6 oz (86 kg)   SpO2 98%   BMI 37.03 kg/m    Wt Readings from Last 3 Encounters:  07/29/24 189 lb 9.6 oz (86 kg)  06/30/24 189 lb 6.4 oz (85.9 kg)  05/15/24 187 lb (84.8 kg)     GEN: Well nourished, well developed female appearing in no acute distress NECK: No  JVD; No carotid bruits CARDIAC: RRR, no murmurs, rubs, gallops RESPIRATORY:  Clear to auscultation without rales, wheezing or rhonchi  ABDOMEN: Appears non-distended. No obvious abdominal masses. EXTREMITIES: No clubbing or cyanosis. No pitting edema. Isolated swelling along ankle joints. Distal pedal pulses are 2+ bilaterally.   Assessment and Plan:   1. Chronic heart failure with preserved ejection fraction (HFpEF) (HCC) - Echocardiogram in 05/2024 showed a  hyperdynamic LVEF of 70 to 75% with grade 1 diastolic dysfunction. Her lower extremity edema has significantly improved with dose adjustment of Lasix  but she is experiencing very frequent urination with this and requests to reduce the dose if able  Recent labs on 07/22/2024 by review of [facility] DXA showed her creatinine was stable at 0.86 with K+ at 4.3. - Will try reducing Lasix  from 40 mg daily to 40 mg daily alternating with 20 mg daily. We reviewed that if her symptoms remain overall well-controlled, could reduce to 20 mg daily after a few weeks. She is also on Spironolactone  25 mg daily. As previously discussed, she is on Amlodipine  10 mg daily but will continue for now given that she has been on this for several years and due to improvement in symptoms.  2. Essential hypertension - Blood pressure was initially recorded at 150/82, rechecked and improved to 132/74. Continue current medical therapy with Amlodipine  10 mg daily, Olmesartan  40 mg daily and Spironolactone  25 mg daily.  3. Mixed hyperlipidemia - Followed by PCP. LDL was at 94 when checked in 12/2023. Continue current medical therapy with Pravastatin  20 mg daily.  4. History of chest pain - She denies any recent anginal symptoms and prior Coronary CTA in 11/2019 showed a coronary calcium score of 0 and no evidence of CAD. Continue with risk factor modification.  Signed, Laymon CHRISTELLA Qua, PA-C

## 2024-07-29 ENCOUNTER — Ambulatory Visit: Attending: Student | Admitting: Student

## 2024-07-29 ENCOUNTER — Encounter: Payer: Self-pay | Admitting: Student

## 2024-07-29 VITALS — BP 132/74 | HR 60 | Ht 60.0 in | Wt 189.6 lb

## 2024-07-29 DIAGNOSIS — E782 Mixed hyperlipidemia: Secondary | ICD-10-CM

## 2024-07-29 DIAGNOSIS — I10 Essential (primary) hypertension: Secondary | ICD-10-CM

## 2024-07-29 DIAGNOSIS — I5032 Chronic diastolic (congestive) heart failure: Secondary | ICD-10-CM | POA: Diagnosis not present

## 2024-07-29 DIAGNOSIS — I5033 Acute on chronic diastolic (congestive) heart failure: Secondary | ICD-10-CM

## 2024-07-29 DIAGNOSIS — Z87898 Personal history of other specified conditions: Secondary | ICD-10-CM

## 2024-07-29 MED ORDER — FUROSEMIDE 40 MG PO TABS: ORAL_TABLET | ORAL | 3 refills | Status: AC

## 2024-07-29 NOTE — Patient Instructions (Signed)
 Medication Instructions:  Your physician has recommended you make the following change in your medication:   Decrease Lasix  to 40 mg Daily alternating with 20 mg Daily   *If you need a refill on your cardiac medications before your next appointment, please call your pharmacy*  Lab Work: NONE   If you have labs (blood work) drawn today and your tests are completely normal, you will receive your results only by: MyChart Message (if you have MyChart) OR A paper copy in the mail If you have any lab test that is abnormal or we need to change your treatment, we will call you to review the results.  Testing/Procedures: NONE   Follow-Up: At [facility], you and your health needs are our priority.  As part of our continuing mission to provide you with exceptional heart care, our providers are all part of one team.  This team includes your primary Cardiologist (physician) and Advanced Practice Providers or APPs (Physician Assistants and Nurse Practitioners) who all work together to provide you with the care you need, when you need it.  Your next appointment:   2 -3 month(s)  Provider:   Dorn Ross, MD or Laymon Qua, PA-C    We recommend signing up for the patient portal called MyChart.  Sign up information is provided on this After Visit Summary.  MyChart is used to connect with patients for Virtual Visits (Telemedicine).  Patients are able to view lab/test results, encounter notes, upcoming appointments, etc.  Non-urgent messages can be sent to your provider as well.   To learn more about what you can do with MyChart, go to [email or web address].   Other Instructions Thank you for choosing [facility]!

## 2024-08-10 ENCOUNTER — Ambulatory Visit (HOSPITAL_COMMUNITY)
Admission: RE | Admit: 2024-08-10 | Discharge: 2024-08-10 | Disposition: A | Discharge status: Home / Self Care | Source: Ambulatory Visit | Attending: Internal Medicine | Admitting: Internal Medicine

## 2024-08-10 ENCOUNTER — Other Ambulatory Visit (HOSPITAL_COMMUNITY): Payer: Self-pay | Admitting: Internal Medicine

## 2024-08-10 DIAGNOSIS — J188 Other pneumonia, unspecified organism: Secondary | ICD-10-CM | POA: Diagnosis not present

## 2024-08-10 DIAGNOSIS — Z853 Personal history of malignant neoplasm of breast: Secondary | ICD-10-CM | POA: Diagnosis not present

## 2024-08-10 DIAGNOSIS — E049 Nontoxic goiter, unspecified: Secondary | ICD-10-CM | POA: Diagnosis not present

## 2024-08-10 DIAGNOSIS — R059 Cough, unspecified: Secondary | ICD-10-CM | POA: Diagnosis not present

## 2024-08-13 ENCOUNTER — Ambulatory Visit: Admitting: Internal Medicine

## 2024-08-14 DIAGNOSIS — J020 Streptococcal pharyngitis: Secondary | ICD-10-CM | POA: Diagnosis not present

## 2024-08-14 DIAGNOSIS — E1122 Type 2 diabetes mellitus with diabetic chronic kidney disease: Secondary | ICD-10-CM | POA: Diagnosis not present

## 2024-08-14 DIAGNOSIS — R051 Acute cough: Secondary | ICD-10-CM | POA: Diagnosis not present

## 2024-08-25 ENCOUNTER — Ambulatory Visit: Admitting: Podiatry

## 2024-08-25 ENCOUNTER — Encounter: Payer: Self-pay | Admitting: Podiatry

## 2024-08-25 DIAGNOSIS — Z0189 Encounter for other specified special examinations: Secondary | ICD-10-CM | POA: Diagnosis not present

## 2024-08-25 DIAGNOSIS — M79674 Pain in right toe(s): Secondary | ICD-10-CM | POA: Diagnosis not present

## 2024-08-25 DIAGNOSIS — B351 Tinea unguium: Secondary | ICD-10-CM | POA: Diagnosis not present

## 2024-08-25 DIAGNOSIS — M79675 Pain in left toe(s): Secondary | ICD-10-CM | POA: Diagnosis not present

## 2024-08-25 DIAGNOSIS — E119 Type 2 diabetes mellitus without complications: Secondary | ICD-10-CM

## 2024-08-25 DIAGNOSIS — M2142 Flat foot [pes planus] (acquired), left foot: Secondary | ICD-10-CM

## 2024-08-25 DIAGNOSIS — M2141 Flat foot [pes planus] (acquired), right foot: Secondary | ICD-10-CM

## 2024-08-25 DIAGNOSIS — M25472 Effusion, left ankle: Secondary | ICD-10-CM

## 2024-08-25 DIAGNOSIS — M25471 Effusion, right ankle: Secondary | ICD-10-CM | POA: Diagnosis not present

## 2024-08-27 DIAGNOSIS — B0053 Herpesviral conjunctivitis: Secondary | ICD-10-CM | POA: Diagnosis not present

## 2024-08-27 DIAGNOSIS — H1789 Other corneal scars and opacities: Secondary | ICD-10-CM | POA: Diagnosis not present

## 2024-08-27 DIAGNOSIS — H16201 Unspecified keratoconjunctivitis, right eye: Secondary | ICD-10-CM | POA: Diagnosis not present

## 2024-08-31 NOTE — Progress Notes (Signed)
  Subjective:  Patient ID: Bethany Ray, female    DOB: [birth year],  MRN: [id number]  Bethany Ray presents to clinic today for for annual diabetic foot examination and preventative diabetic foot care for painful elongated mycotic toenails 1-5 bilaterally which are tender when wearing enclosed shoe gear. Pain is relieved with periodic professional debridement.  Chief Complaint  Patient presents with   Toe Pain    She saw Dr. Sheryle  last month. A1c 7 per report   New problem(s): None.   PCP is Sheryle Carwin, MD.  Allergies  Allergen Reactions   Penicillins Nausea And Vomiting    Childhood allergy  Other Reaction(s): Not available  Product containing penicillin and antibiotic (product)    Review of Systems: Negative except as noted in the HPI.  Objective: No changes noted in today's physical examination. There were no vitals filed for this visit. Bethany Ray is a pleasant 78 y.o. female in NAD. AAO x 3.   Diabetic foot exam was performed with the following findings:   Vascular Examination: CFT less than 3 seconds. DP pulses palpable b/l. PT pulses nonpalpable b/l secondary to ankle edema. Digital hair absent. Skin temperature gradient warm to warn b/l. No ischemia or gangrene. No cyanosis or clubbing noted b/l.    Neurological Examination: Sensation grossly intact b/l with 10 gram monofilament. Vibratory sensation intact b/l.   Dermatological Examination: No open wounds. No interdigital macerations.   Toenails 1-5 b/l thick, discolored, elongated with subungual debris and pain on dorsal palpation.   No hyperkeratotic nor porokeratotic lesions.  Pedal skin is warm and supple b/l LE.  Musculoskeletal Examination: Muscle strength 5/5 to all lower extremity muscle groups bilaterally. No pain, crepitus or joint limitation noted with ROM bilateral LE. Pes planus deformity noted bilateral LE.  Radiographs: None    Assessment/Plan: 1. Pain due to onychomycosis of  toenails of both feet   2. Pes planus of both feet   3. Edema of both ankles   4. Type 2 diabetes mellitus without complication, without long-term current use of insulin  (HCC)   5. Encounter for diabetic foot exam [facility])   -Patient was evaluated today. All questions/concerns addressed on today's visit. -Diabetic foot examination performed today. -Continue diabetic foot care principles: inspect feet daily, monitor glucose as recommended by PCP and/or Endocrinologist, and follow prescribed diet per PCP, Endocrinologist and/or dietician. -Patient to continue soft, supportive shoe gear daily. -Mycotic toenails 1-5 bilaterally were debrided in length and girth with sterile nail nippers and dremel without incident. -Shoe recommendations given for [employer] sneakers. -Patient/POA to call should there be question/concern in the interim.   Return in about 3 months (around 11/25/2024).  Delon LITTIE Merlin, DPM      [facility] LOCATION: [address], [place] [address]                   Office [phone]   [facility] LOCATION: [address] [place], [place] [address] Office [phone]

## 2024-09-07 DIAGNOSIS — B0052 Herpesviral keratitis: Secondary | ICD-10-CM | POA: Diagnosis not present

## 2024-09-07 DIAGNOSIS — H1789 Other corneal scars and opacities: Secondary | ICD-10-CM | POA: Diagnosis not present

## 2024-09-11 ENCOUNTER — Encounter: Payer: Self-pay | Admitting: Emergency Medicine

## 2024-09-11 ENCOUNTER — Ambulatory Visit
Admission: EM | Admit: 2024-09-11 | Discharge: 2024-09-11 | Disposition: A | Discharge status: Home / Self Care | Attending: Student | Admitting: Student

## 2024-09-11 ENCOUNTER — Other Ambulatory Visit: Payer: Self-pay

## 2024-09-11 ENCOUNTER — Observation Stay (HOSPITAL_COMMUNITY)
Admission: EM | Admit: 2024-09-11 | Discharge: 2024-09-14 | Disposition: A | Attending: Internal Medicine | Admitting: Internal Medicine

## 2024-09-11 ENCOUNTER — Encounter (HOSPITAL_COMMUNITY): Payer: Self-pay

## 2024-09-11 ENCOUNTER — Emergency Department (HOSPITAL_COMMUNITY)

## 2024-09-11 DIAGNOSIS — E119 Type 2 diabetes mellitus without complications: Secondary | ICD-10-CM | POA: Insufficient documentation

## 2024-09-11 DIAGNOSIS — I4581 Long QT syndrome: Secondary | ICD-10-CM | POA: Diagnosis not present

## 2024-09-11 DIAGNOSIS — E785 Hyperlipidemia, unspecified: Secondary | ICD-10-CM | POA: Diagnosis not present

## 2024-09-11 DIAGNOSIS — Z79899 Other long term (current) drug therapy: Secondary | ICD-10-CM | POA: Insufficient documentation

## 2024-09-11 DIAGNOSIS — Z23 Encounter for immunization: Secondary | ICD-10-CM | POA: Insufficient documentation

## 2024-09-11 DIAGNOSIS — E042 Nontoxic multinodular goiter: Secondary | ICD-10-CM | POA: Diagnosis not present

## 2024-09-11 DIAGNOSIS — I110 Hypertensive heart disease with heart failure: Secondary | ICD-10-CM | POA: Insufficient documentation

## 2024-09-11 DIAGNOSIS — K219 Gastro-esophageal reflux disease without esophagitis: Secondary | ICD-10-CM | POA: Diagnosis not present

## 2024-09-11 DIAGNOSIS — R0789 Other chest pain: Principal | ICD-10-CM | POA: Insufficient documentation

## 2024-09-11 DIAGNOSIS — J189 Pneumonia, unspecified organism: Secondary | ICD-10-CM | POA: Diagnosis not present

## 2024-09-11 DIAGNOSIS — R079 Chest pain, unspecified: Principal | ICD-10-CM | POA: Diagnosis present

## 2024-09-11 DIAGNOSIS — R091 Pleurisy: Secondary | ICD-10-CM | POA: Insufficient documentation

## 2024-09-11 DIAGNOSIS — R071 Chest pain on breathing: Secondary | ICD-10-CM

## 2024-09-11 DIAGNOSIS — I160 Hypertensive urgency: Secondary | ICD-10-CM | POA: Insufficient documentation

## 2024-09-11 DIAGNOSIS — Z7982 Long term (current) use of aspirin: Secondary | ICD-10-CM | POA: Insufficient documentation

## 2024-09-11 DIAGNOSIS — I10 Essential (primary) hypertension: Secondary | ICD-10-CM | POA: Diagnosis present

## 2024-09-11 DIAGNOSIS — I5032 Chronic diastolic (congestive) heart failure: Secondary | ICD-10-CM | POA: Insufficient documentation

## 2024-09-11 DIAGNOSIS — D72829 Elevated white blood cell count, unspecified: Secondary | ICD-10-CM | POA: Insufficient documentation

## 2024-09-11 DIAGNOSIS — M069 Rheumatoid arthritis, unspecified: Secondary | ICD-10-CM | POA: Insufficient documentation

## 2024-09-11 DIAGNOSIS — E559 Vitamin D deficiency, unspecified: Secondary | ICD-10-CM | POA: Insufficient documentation

## 2024-09-11 DIAGNOSIS — R9431 Abnormal electrocardiogram [ECG] [EKG]: Secondary | ICD-10-CM | POA: Diagnosis present

## 2024-09-11 HISTORY — DX: Chronic pulmonary edema: J81.1

## 2024-09-11 HISTORY — DX: Other ill-defined heart diseases: I51.89

## 2024-09-11 LAB — CBC
HCT: 37.7 % (ref 36.0–46.0)
Hemoglobin: 12.5 g/dL (ref 12.0–15.0)
MCH: 30.8 pg (ref 26.0–34.0)
MCHC: 33.2 g/dL (ref 30.0–36.0)
MCV: 92.9 fL (ref 80.0–100.0)
Platelets: 354 K/uL (ref 150–400)
RBC: 4.06 MIL/uL (ref 3.87–5.11)
RDW: 17.2 % — ABNORMAL HIGH (ref 11.5–15.5)
WBC: 17 K/uL — ABNORMAL HIGH (ref 4.0–10.5)
nRBC: 0 % (ref 0.0–0.2)

## 2024-09-11 LAB — BASIC METABOLIC PANEL WITH GFR
Anion gap: 14 (ref 5–15)
BUN: 8 mg/dL (ref 8–23)
CO2: 24 mmol/L (ref 22–32)
Calcium: 9.7 mg/dL (ref 8.9–10.3)
Chloride: 104 mmol/L (ref 98–111)
Creatinine, Ser: 0.74 mg/dL (ref 0.44–1.00)
GFR, Estimated: >60 mL/min (ref >60)
Glucose, Bld: 105 mg/dL — ABNORMAL HIGH (ref 70–99)
Potassium: 3.5 mmol/L (ref 3.5–5.1)
Sodium: 141 mmol/L (ref 135–145)

## 2024-09-11 LAB — PRO BRAIN NATRIURETIC PEPTIDE: Pro Brain Natriuretic Peptide: 265 pg/mL (ref <300.0)

## 2024-09-11 LAB — TROPONIN T, HIGH SENSITIVITY
Troponin T High Sensitivity: 21 ng/L — ABNORMAL HIGH (ref 0–19)
Troponin T High Sensitivity: 20 ng/L — ABNORMAL HIGH (ref 0–19)

## 2024-09-11 MED ORDER — AMLODIPINE BESYLATE 5 MG PO TABS
10.0000 mg | ORAL_TABLET | Freq: Every day | ORAL | Status: DC
  Administered 2024-09-12 – 2024-09-13 (×3): 10 mg via ORAL
  Filled 2024-09-11 (×2): qty 1
  Filled 2024-09-11: qty 2

## 2024-09-11 MED ORDER — SODIUM CHLORIDE 0.9 % IV SOLN
2.0000 g | INTRAVENOUS | Status: DC
  Administered 2024-09-12 – 2024-09-13 (×3): 2 g via INTRAVENOUS
  Filled 2024-09-11 (×3): qty 20

## 2024-09-11 MED ORDER — VITAMIN B-12 1000 MCG PO TABS
1000.0000 ug | ORAL_TABLET | Freq: Every day | ORAL | Status: DC
  Administered 2024-09-12 – 2024-09-14 (×3): 1000 ug via ORAL
  Filled 2024-09-11 (×3): qty 1

## 2024-09-11 MED ORDER — ACETAMINOPHEN 325 MG PO TABS: 650.0000 mg | ORAL_TABLET | Freq: Four times a day (QID) | ORAL | Status: DC | PRN

## 2024-09-11 MED ORDER — ACETAMINOPHEN 650 MG RE SUPP: 650.0000 mg | Freq: Four times a day (QID) | RECTAL | Status: DC | PRN

## 2024-09-11 MED ORDER — PRAVASTATIN SODIUM 20 MG PO TABS
20.0000 mg | ORAL_TABLET | Freq: Every evening | ORAL | Status: DC
  Administered 2024-09-12 – 2024-09-13 (×2): 20 mg via ORAL
  Filled 2024-09-11 (×2): qty 1

## 2024-09-11 MED ORDER — ASPIRIN 81 MG PO TBEC: 81.0000 mg | DELAYED_RELEASE_TABLET | Freq: Every evening | ORAL | Status: DC

## 2024-09-11 MED ORDER — ONDANSETRON HCL 4 MG/2ML IJ SOLN: 4.0000 mg | Freq: Four times a day (QID) | INTRAMUSCULAR | Status: DC | PRN

## 2024-09-11 MED ORDER — BUTALBITAL-APAP-CAFFEINE 50-325-40 MG PO TABS: 1.0000 | ORAL_TABLET | Freq: Four times a day (QID) | ORAL | Status: DC | PRN

## 2024-09-11 MED ORDER — HYDROCODONE-ACETAMINOPHEN 5-325 MG PO TABS
1.0000 | ORAL_TABLET | ORAL | Status: DC | PRN
  Administered 2024-09-12: 1 via ORAL
  Administered 2024-09-14: 2 via ORAL
  Filled 2024-09-11: qty 2
  Filled 2024-09-11: qty 1

## 2024-09-11 MED ORDER — ENOXAPARIN SODIUM 40 MG/0.4ML IJ SOSY: 40.0000 mg | PREFILLED_SYRINGE | INTRAMUSCULAR | Status: DC

## 2024-09-11 MED ORDER — MORPHINE SULFATE (PF) 2 MG/ML IV SOLN
2.0000 mg | INTRAVENOUS | Status: DC | PRN
  Administered 2024-09-12 – 2024-09-13 (×2): 2 mg via INTRAVENOUS
  Filled 2024-09-11 (×2): qty 1

## 2024-09-11 MED ORDER — FOLIC ACID 1 MG PO TABS
1.0000 mg | ORAL_TABLET | Freq: Every day | ORAL | Status: DC
  Administered 2024-09-12 – 2024-09-14 (×3): 1 mg via ORAL
  Filled 2024-09-11 (×3): qty 1

## 2024-09-11 MED ORDER — SODIUM CHLORIDE 0.9 % IV SOLN
100.0000 mg | Freq: Two times a day (BID) | INTRAVENOUS | Status: DC
  Administered 2024-09-12 – 2024-09-13 (×5): 100 mg via INTRAVENOUS
  Filled 2024-09-11 (×6): qty 100

## 2024-09-11 MED ORDER — ONDANSETRON HCL 4 MG PO TABS: 4.0000 mg | ORAL_TABLET | Freq: Four times a day (QID) | ORAL | Status: DC | PRN

## 2024-09-11 MED ORDER — ACETAMINOPHEN 500 MG PO TABS
1000.0000 mg | ORAL_TABLET | Freq: Once | ORAL | Status: AC
  Administered 2024-09-12: 1000 mg via ORAL
  Filled 2024-09-11: qty 2

## 2024-09-11 MED ORDER — SODIUM CHLORIDE 0.9 % IV SOLN: 2.0000 g | INTRAVENOUS | Status: DC

## 2024-09-11 MED ORDER — PANTOPRAZOLE SODIUM 40 MG PO TBEC
40.0000 mg | DELAYED_RELEASE_TABLET | Freq: Every day | ORAL | Status: DC
  Administered 2024-09-12 – 2024-09-14 (×3): 40 mg via ORAL
  Filled 2024-09-11 (×3): qty 1

## 2024-09-11 NOTE — Discharge Instructions (Signed)
 Sent to emergency department in POV driven by daughter.

## 2024-09-11 NOTE — ED Triage Notes (Signed)
 Pt reports left sided chest pain, left arm discomfort since last night. BLE swelling since this am, has taken lasix  today. Reports elevated bp readings at home.

## 2024-09-11 NOTE — ED Notes (Addendum)
 Patient is being discharged from the Urgent Care and sent to the Emergency Department via POV . Per PA, patient is in need of higher level of care due to hypertension/chest pain. Patient is aware and verbalizes understanding of plan of care.  Vitals:   09/11/24 1645  BP: (!) 168/73  Pulse: 81  Resp: 20  Temp: 98.2 F (36.8 C)  SpO2: 96%  Pt/pt family declined local ED during provider assessment. Pt/pt family reported would take pt to ED in [place].

## 2024-09-11 NOTE — ED Provider Notes (Signed)
 [facility]    CSN: [id number] Arrival date & time: 09/11/24  1637      History   Chief Complaint Chief Complaint  Patient presents with   Chest Pain    HPI Bethany Ray is a 78 y.o. female presenting with chest pain and elevated blood pressure.  History of hypertension, diabetes.  We last saw this patient 05/2024, when she was having chest pain, and she was ultimately hospitalized for pulmonary edema at that time; states that her symptoms feel similar today. Pt reports left sided chest pain, left arm discomfort since last night. BLE swelling and discomfort since this am, has taken lasix  today. Reports elevated bp readings at home.  Also notes transient lightheadedness and headaches.    HPI  Past Medical History:  Diagnosis Date   Arthritis    Breast cancer (HCC) 09/14/2013   Cancer (HCC) approx 2006   rt breast/lumpectomy/chemo/rad tx   Diabetes mellitus    Diastolic dysfunction    High cholesterol    Hypertension    Pulmonary edema    Shingles     Patient Active Problem List   Diagnosis Date Noted   Acute pulmonary edema (HCC) 05/16/2024   Uncontrolled type 2 diabetes mellitus with hyperglycemia, without long-term current use of insulin  (HCC) 05/16/2024   Essential hypertension 05/16/2024   Type 2 diabetes mellitus (HCC) 02/05/2023   Loose stools 11/30/2021   Encounter for colonoscopy in patient with family history of colon cancer 05/25/2019   Chest pain 05/25/2019   Unilateral primary osteoarthritis, right knee 02/27/2017   Unilateral primary osteoarthritis, left knee 02/27/2017   Breast cancer of lower-inner quadrant of right female breast (HCC) 09/14/2013   STRESS FRACTURE-SITE 11/19/2007   Sprain of ankle 11/19/2007    Past Surgical History:  Procedure Laterality Date   ABDOMINAL HYSTERECTOMY     BREAST SURGERY     CHOLECYSTECTOMY     COLONOSCOPY WITH PROPOFOL  N/A 04/03/2022   Procedure: COLONOSCOPY WITH PROPOFOL ;  Surgeon: Eartha Angelia Sieving, MD;  Location: [facility];  Service: Gastroenterology;  Laterality: N/A;  1:00, per Anette Caldron pt knows to arrive at 9:00   IR RADIOLOGY PERIPHERAL GUIDED IV START  11/18/2019   IR US  GUIDE VASC ACCESS RIGHT  11/18/2019   POLYPECTOMY  04/03/2022   Procedure: POLYPECTOMY;  Surgeon: Eartha Angelia Sieving, MD;  Location: [facility];  Service: Gastroenterology;;    OB History     Gravida      Para      Term      Preterm      AB      Living  2      SAB      IAB      Ectopic      Multiple      Live Births               Home Medications    Prior to Admission medications   Medication Sig Start Date End Date Taking? Authorizing Provider  acetaminophen  (TYLENOL ) 500 MG tablet Take 500 mg by mouth every 8 (eight) hours as needed for moderate pain.    [provider]  adalimumab (HUMIRA, 2 PEN,) 40 MG/0.4ML pen Inject 0.4 mLs into the skin every 14 (fourteen) days.    [provider]  amLODipine  (NORVASC ) 10 MG tablet Take 10 mg by mouth daily.    [provider]  aspirin  EC 81 MG tablet Take 81 mg by mouth daily.  [provider]  cetirizine  (ZYRTEC  ALLERGY) 10 MG tablet Take 1 tablet (10 mg total) by mouth daily. 01/15/23   Christopher Savannah, PA-C  cyanocobalamin  1000 MCG tablet Take 1 tablet (1,000 mcg total) by mouth daily. 05/17/24   Regalado, Belkys A, MD  diclofenac  Sodium (VOLTAREN ) 1 % GEL Apply 2 g topically 4 (four) times daily. 05/17/24   Regalado, Belkys A, MD  fluticasone  (FLONASE ) 50 MCG/ACT nasal spray Place 2 sprays into both nostrils daily. 02/22/23   Rodriguez-Southworth, Sylvia, PA-C  folic acid  (FOLVITE ) 1 MG tablet Take 1 mg by mouth daily.    [provider]  furosemide  (LASIX ) 40 MG tablet Take 40 mg Daily  alternating with 20 mg Daily 07/29/24   Strader, Brittany M, PA-C  glimepiride  (AMARYL ) 1 MG tablet Take 1 mg by mouth daily with breakfast.  06/16/16   [provider]  lidocaine   (LIDODERM ) 5 % Place 1 patch onto the skin daily. Remove & Discard patch within 12 hours or as directed by MD 05/18/24   Regalado, Belkys A, MD  loperamide (IMODIUM A-D) 2 MG tablet Take 2-4 mg by mouth 4 (four) times daily as needed for diarrhea or loose stools.    [provider]  metFORMIN  (GLUCOPHAGE -XR) 500 MG 24 hr tablet Take 500 mg by mouth 2 (two) times daily.  08/29/16   [provider]  methotrexate (RHEUMATREX) 2.5 MG tablet Take 10 mg by mouth 2 (two) times a week. Thursdays and Fridays 08/15/16   [provider]  Multiple Vitamin (MULITIVITAMIN WITH MINERALS) TABS Take 1 tablet by mouth daily.    [provider]  olmesartan  (BENICAR ) 40 MG tablet Take 1 tablet (40 mg total) by mouth daily. 05/18/24   Regalado, Belkys A, MD  pantoprazole  (PROTONIX ) 40 MG tablet Take 1 tablet (40 mg total) by mouth daily. 05/17/24   Regalado, Belkys A, MD  pravastatin  (PRAVACHOL ) 20 MG tablet Take 20 mg by mouth every evening.     [provider]  spironolactone  (ALDACTONE ) 25 MG tablet Take 25 mg by mouth daily.    [provider]  Turmeric Curcumin 500 MG CAPS Take 500 mg by mouth daily.    [provider]  valACYclovir (VALTREX) 1000 MG tablet Take 1,000 mg by mouth daily.    [provider]    Family History History reviewed. No pertinent family history.  Social History Social History   Tobacco Use   Smoking status: Never   Smokeless tobacco: Never  Vaping Use   Vaping status: Never Used  Substance Use Topics   Alcohol use: No   Drug use: No     Allergies   Penicillins   Review of Systems Review of Systems  Cardiovascular:  Positive for chest pain and leg swelling.  Neurological:  Positive for light-headedness and headaches.     Physical Exam Triage Vital Signs ED Triage Vitals  Encounter Vitals Group     BP      Girls Systolic BP Percentile      Girls Diastolic BP Percentile      Boys Systolic BP  Percentile      Boys Diastolic BP Percentile      Pulse      Resp      Temp      Temp src      SpO2      Weight      Height      Head Circumference      Peak Flow  Pain Score      Pain Loc      Pain Education      Exclude from Growth Chart    No data found.  Updated Vital Signs BP (!) 168/73 (BP Location: Right Arm)   Pulse 81   Temp 98.2 F (36.8 C) (Oral)   Resp 20   SpO2 96%   Visual Acuity Right Eye Distance:   Left Eye Distance:   Bilateral Distance:    Right Eye Near:   Left Eye Near:    Bilateral Near:     Physical Exam Vitals reviewed.  Constitutional:      General: She is not in acute distress.    Appearance: Normal appearance. She is not ill-appearing or diaphoretic.  HENT:     Head: Normocephalic and atraumatic.  Cardiovascular:     Rate and Rhythm: Normal rate and regular rhythm.     Pulses:          Radial pulses are 2+ on the right side and 2+ on the left side.     Heart sounds: Normal heart sounds.     Comments: Radial pulses equal and regular Pulmonary:     Effort: Pulmonary effort is normal.     Breath sounds: Normal breath sounds.  Chest:     Comments: There is reproducible left anterior chest wall tenderness to palpation Musculoskeletal:     Comments: 1+ pedal edema bilaterally, with bilateral calf tenderness.  Skin:    General: Skin is warm.  Neurological:     General: No focal deficit present.     Mental Status: She is alert and oriented to person, place, and time.     Comments: PERRLA, EOMI. CN 2-12 grossly intact  Psychiatric:        Mood and Affect: Mood normal.        Behavior: Behavior normal.        Thought Content: Thought content normal.        Judgment: Judgment normal.      UC Treatments / Results  Labs (all labs ordered are listed, but only abnormal results are displayed) Labs Reviewed - No data to display  EKG   Radiology No results found.  Procedures Procedures (including critical care  time)  Medications Ordered in UC Medications - No data to display  Initial Impression / Assessment and Plan / UC Course  I have reviewed the triage vital signs and the nursing notes.  Pertinent labs & imaging results that were available during my care of the patient were reviewed by me and considered in my medical decision making (see chart for details).     Patient is a pleasant 78 year old female presenting with chest pain and elevated blood pressure.  At time of visit, her blood pressure is 168/73, with transient lightheadedness and headaches.  She is also having left-sided chest pain with radiation down the left arm.  She was hospitalized for pulmonary edema 05/2024, and states her symptoms feel the same today.  EKG unchanged compared with 05/2024 EKG.  DDx includes pulmonary edema, CHF, ACS, or other.  Declines EMS, but daughter verbalizes if her symptoms change or worsen on the way to the ER, they will pull over and call 911.  Final Clinical Impressions(s) / UC Diagnoses   Final diagnoses:  Atypical chest pain     Discharge Instructions      Sent to emergency department in POV driven by daughter.     ED Prescriptions   None  PDMP not reviewed this encounter.   Arlyss Leita BRAVO, PA-C 09/11/24 [phone]

## 2024-09-11 NOTE — ED Notes (Signed)
 Artifact noted on initial EKG, repeat EKG's completed per provider request.

## 2024-09-11 NOTE — ED Provider Notes (Signed)
 [place] [facility] AT [facility] Provider Note   CSN: [id number] Arrival date & time: 09/11/24  1750     Patient presents with: Chest Pain   Bethany Ray is a 78 y.o. female.   78 year old female with prior medical history as detailed below presents for evaluation.  Patient complains of midsternal chest discomfort and soreness.  Symptoms began Wednesday evening.  They have been constant since.  She denies fever.  She denies cough or congestion.  She reports some mild shortness of breath.  She reports some mild edema to the lower extremities bilaterally.  She reports compliance with previously prescribed diuretic.  She has a history of heart failure.  She denies back pain.  She appears to be in no distress on exam.  The history is provided by the patient and medical records.       Prior to Admission medications   Medication Sig Start Date End Date Taking? Authorizing Provider  acetaminophen  (TYLENOL ) 500 MG tablet Take 500 mg by mouth every 8 (eight) hours as needed for moderate pain.    [provider]  adalimumab (HUMIRA, 2 PEN,) 40 MG/0.4ML pen Inject 0.4 mLs into the skin every 14 (fourteen) days.    [provider]  amLODipine  (NORVASC ) 10 MG tablet Take 10 mg by mouth daily.    [provider]  aspirin  EC 81 MG tablet Take 81 mg by mouth daily.    [provider]  cetirizine  (ZYRTEC  ALLERGY) 10 MG tablet Take 1 tablet (10 mg total) by mouth daily. 01/15/23   Christopher Savannah, PA-C  cyanocobalamin  1000 MCG tablet Take 1 tablet (1,000 mcg total) by mouth daily. 05/17/24   Regalado, Belkys A, MD  diclofenac  Sodium (VOLTAREN ) 1 % GEL Apply 2 g topically 4 (four) times daily. 05/17/24   Regalado, Belkys A, MD  fluticasone  (FLONASE ) 50 MCG/ACT nasal spray Place 2 sprays into both nostrils daily. 02/22/23   Rodriguez-Southworth, Sylvia, PA-C  folic acid  (FOLVITE ) 1 MG tablet Take 1 mg by mouth daily.    [provider]   furosemide  (LASIX ) 40 MG tablet Take 40 mg Daily  alternating with 20 mg Daily 07/29/24   Strader, Brittany M, PA-C  glimepiride  (AMARYL ) 1 MG tablet Take 1 mg by mouth daily with breakfast.  06/16/16   [provider]  lidocaine  (LIDODERM ) 5 % Place 1 patch onto the skin daily. Remove & Discard patch within 12 hours or as directed by MD 05/18/24   Regalado, Belkys A, MD  loperamide (IMODIUM A-D) 2 MG tablet Take 2-4 mg by mouth 4 (four) times daily as needed for diarrhea or loose stools.    [provider]  metFORMIN  (GLUCOPHAGE -XR) 500 MG 24 hr tablet Take 500 mg by mouth 2 (two) times daily.  08/29/16   [provider]  methotrexate (RHEUMATREX) 2.5 MG tablet Take 10 mg by mouth 2 (two) times a week. Thursdays and Fridays 08/15/16   [provider]  Multiple Vitamin (MULITIVITAMIN WITH MINERALS) TABS Take 1 tablet by mouth daily.    [provider]  olmesartan  (BENICAR ) 40 MG tablet Take 1 tablet (40 mg total) by mouth daily. 05/18/24   Regalado, Belkys A, MD  pantoprazole  (PROTONIX ) 40 MG tablet Take 1 tablet (40 mg total) by mouth daily. 05/17/24   Regalado, Belkys A, MD  pravastatin  (PRAVACHOL ) 20 MG tablet Take 20 mg by mouth every evening.     [provider]  spironolactone  (ALDACTONE ) 25 MG tablet Take  25 mg by mouth daily.    [provider]  Turmeric Curcumin 500 MG CAPS Take 500 mg by mouth daily.    [provider]  valACYclovir (VALTREX) 1000 MG tablet Take 1,000 mg by mouth daily.    [provider]    Allergies: Penicillins    Review of Systems  All other systems reviewed and are negative.   Updated Vital Signs BP (!) 180/78 (BP Location: Right Arm)   Pulse 78   Temp 98.4 F (36.9 C) (Oral)   Resp 20   Ht 5' (1.524 m)   Wt 83.9 kg   SpO2 95%   BMI 36.13 kg/m   Physical Exam Vitals and nursing note reviewed.  Constitutional:      General: She is not in acute distress.    Appearance:  Normal appearance. She is well-developed.  HENT:     Head: Normocephalic and atraumatic.     Mouth/Throat:     Mouth: Mucous membranes are moist.  Eyes:     Extraocular Movements: Extraocular movements intact.     Conjunctiva/sclera: Conjunctivae normal.     Pupils: Pupils are equal, round, and reactive to light.  Cardiovascular:     Rate and Rhythm: Normal rate and regular rhythm.     Heart sounds: Normal heart sounds.  Pulmonary:     Effort: Pulmonary effort is normal. No respiratory distress.     Breath sounds: Normal breath sounds.  Abdominal:     General: Abdomen is flat. There is no distension.     Palpations: Abdomen is soft.     Tenderness: There is no abdominal tenderness.  Musculoskeletal:        General: No deformity. Normal range of motion.     Cervical back: Normal range of motion and neck supple.  Skin:    General: Skin is warm and dry.  Neurological:     General: No focal deficit present.     Mental Status: She is alert and oriented to person, place, and time. Mental status is at baseline.     (all labs ordered are listed, but only abnormal results are displayed) Labs Reviewed  BASIC METABOLIC PANEL WITH GFR  CBC  PRO BRAIN NATRIURETIC PEPTIDE  TROPONIN T, HIGH SENSITIVITY    EKG: EKG Interpretation Date/Time:  Friday September 11 2024 18:02:45 EST Ventricular Rate:  66 PR Interval:  137 QRS Duration:  82 QT Interval:  374 QTC Calculation: 392 R Axis:   -23  Text Interpretation: Sinus rhythm Low voltage, precordial leads Left ventricular hypertrophy Confirmed by Laurice Coy [phone]) on 09/11/2024 6:39:18 PM  Radiology: No results found.   Procedures   Medications Ordered in the ED - No data to display                                  Medical Decision Making Patient presents with atypical chest discomfort.  Symptoms have persisted for the last 48 hours.  Has complaints of mild shortness of breath and mild increased edema to the lower  extremities.  Troponins are 20 and 21.  EKG does not suggest significant acute ischemia.  BNP is 265.  Chest x-ray suggests mild pulmonary edema.  White count is elevated at 17.  Case discussed with cardiology.  They agree with plan to admit and will consult on the patient in the morning.  Hospitalist service is aware of case and will evaluate for admission.  Amount and/or Complexity of Data Reviewed Labs: ordered. Radiology: ordered.  Risk OTC drugs. Decision regarding hospitalization.        Final diagnoses:  Chest pain, unspecified type    ED Discharge Orders     None          Laurice Maude BROCKS, MD 09/11/24 [id number]

## 2024-09-11 NOTE — H&P (Signed)
 History and Physical    Bethany Ray FMW:[id number] DOB: Jan 17, 1946 DOA: 09/11/2024  PCP: Sheryle Carwin, MD Patient coming from: Home  Chief Complaint:  soreness of the chest and pain with deep breathing  HPI: Bethany Ray is a 78 y.o. female with medical history significant of breast cancer, diabetes, diastolic dysfunction, hyperlipidemia ,hypertension and pulmonary edema who presented to the hospital with complaint of increasing chest discomfort which has been present for about 4 days. According to the daughter, the patient was doing well around Thanksgiving. They went to the beach and she did not have a cough or fevers or any chest discomfort. She states that after returning home the patient started to have more of a nonproductive cough and kept complaining of chest discomfort. She states that it was exacerbated by deep breathing and did not appear to be worse with exertion. However, when patient was at rest or lying down she appeared to have complain more. They tried taking acetaminophen  to help with the pain however there was no improvement. Therefore, daughter brought patient to the hospital for further evaluation.    ED Course:  In the ER, BP 177/63, HR 91, RR 17, O2 saturation 92% on room air,  and Tmax 99. Cbc demonstrated wbc 17,  hb/hct 12.5/37.7, and platelet 354. Chemistry demonstrated Na 141, K 3.5, Cl 104, bicarb 24, Bun/Cr 8/0.74 and glucose 105.Initial troponin was 21 and repeat was 20.  CXR demonstrated low lung volumes with vascular crowding and streaky bibasilar atelectasis.  Suspect mild interstitial edema.. Urinalysis was not performed. EKG normal sinus rhythm with prolonged QTc of 597  Review of Systems:  All systems reviewed and apart from history of presenting illness, are negative.  Past Medical History:  Diagnosis Date   Arthritis    Breast cancer (HCC) 09/14/2013   Cancer (HCC) approx 2006   rt breast/lumpectomy/chemo/rad tx   Diabetes mellitus    Diastolic  dysfunction    High cholesterol    Hypertension    Pulmonary edema    Shingles     Past Surgical History:  Procedure Laterality Date   ABDOMINAL HYSTERECTOMY     BREAST SURGERY     CHOLECYSTECTOMY     COLONOSCOPY WITH PROPOFOL  N/A 04/03/2022   Procedure: COLONOSCOPY WITH PROPOFOL ;  Surgeon: Eartha Angelia Sieving, MD;  Location: [facility];  Service: Gastroenterology;  Laterality: N/A;  1:00, per Anette Caldron pt knows to arrive at 9:00   IR RADIOLOGY PERIPHERAL GUIDED IV START  11/18/2019   IR US  GUIDE VASC ACCESS RIGHT  11/18/2019   POLYPECTOMY  04/03/2022   Procedure: POLYPECTOMY;  Surgeon: Eartha Angelia Sieving, MD;  Location: [facility];  Service: Gastroenterology;;     reports that she has never smoked. She has never used smokeless tobacco. She reports that she does not drink alcohol and does not use drugs.  Allergies  Allergen Reactions   Penicillins Nausea And Vomiting    Childhood allergy  Other Reaction(s): Not available  Product containing penicillin and antibiotic (product)    History reviewed. No pertinent family history.  Prior to Admission medications   Medication Sig Start Date End Date Taking? Authorizing Provider  acetaminophen  (TYLENOL ) 500 MG tablet Take 500 mg by mouth every 8 (eight) hours as needed for moderate pain.   Yes [provider]  amLODipine  (NORVASC ) 10 MG tablet Take 10 mg by mouth daily.   Yes [provider]  aspirin  EC 81 MG tablet Take 81 mg by mouth daily.  Yes [provider]  cyanocobalamin  1000 MCG tablet Take 1 tablet (1,000 mcg total) by mouth daily. 05/17/24  Yes Regalado, Belkys A, MD  diclofenac  Sodium (VOLTAREN ) 1 % GEL Apply 2 g topically 4 (four) times daily. Patient taking differently: Apply 2 g topically 4 (four) times daily as needed. 05/17/24  Yes Regalado, Belkys A, MD  fluticasone  (FLONASE ) 50 MCG/ACT nasal spray Place 2 sprays into both nostrils daily. Patient taking differently: Place 2  sprays into both nostrils daily as needed. 02/22/23  Yes Rodriguez-Southworth, Sylvia, PA-C  folic acid  (FOLVITE ) 1 MG tablet Take 1 mg by mouth daily.   Yes [provider]  furosemide  (LASIX ) 40 MG tablet Take 40 mg Daily  alternating with 20 mg Daily 07/29/24  Yes Strader, Brittany M, PA-C  glimepiride  (AMARYL ) 1 MG tablet Take 1 mg by mouth daily with breakfast.  06/16/16  Yes [provider]  lidocaine  (LIDODERM ) 5 % Place 1 patch onto the skin daily. Remove & Discard patch within 12 hours or as directed by MD Patient taking differently: Place 1 patch onto the skin daily as needed. Remove & Discard patch within 12 hours or as directed by MD 05/18/24  Yes Regalado, Belkys A, MD  loperamide (IMODIUM A-D) 2 MG tablet Take 2-4 mg by mouth 4 (four) times daily as needed for diarrhea or loose stools.   Yes [provider]  metFORMIN  (GLUCOPHAGE -XR) 500 MG 24 hr tablet Take 500 mg by mouth 2 (two) times daily.  08/29/16  Yes [provider]  methotrexate (RHEUMATREX) 2.5 MG tablet Take 7.5 mg by mouth 2 (two) times a week. Thursdays and Fridays 08/15/16  Yes [provider]  olmesartan  (BENICAR ) 40 MG tablet Take 1 tablet (40 mg total) by mouth daily. 05/18/24  Yes Regalado, Belkys A, MD  pantoprazole  (PROTONIX ) 40 MG tablet Take 1 tablet (40 mg total) by mouth daily. 05/17/24  Yes Regalado, Belkys A, MD  pravastatin  (PRAVACHOL ) 20 MG tablet Take 20 mg by mouth every evening.    Yes [provider]  spironolactone  (ALDACTONE ) 25 MG tablet Take 25 mg by mouth daily.   Yes [provider]  Turmeric Curcumin 500 MG CAPS Take 500 mg by mouth daily.   Yes [provider]  valACYclovir (VALTREX) 1000 MG tablet Take 1,000 mg by mouth 3 (three) times daily.   Yes [provider]  cetirizine  (ZYRTEC  ALLERGY) 10 MG tablet Take 1 tablet (10 mg total) by mouth daily. Patient not taking: Reported on 09/11/2024 01/15/23   Christopher Savannah, PA-C     Physical Exam: Vitals:   09/11/24 1758 09/11/24 2123  BP: (!) 180/78 (!) 175/69  Pulse: 78 73  Resp: 20 11  Temp: 98.4 F (36.9 C) 99 F (37.2 C)  TempSrc: Oral Oral  SpO2: 95% 94%  Weight: 83.9 kg   Height: 5' (1.524 m)     Physical Exam Constitutional:      General: He is not in acute distress.    Appearance: Normal appearance.  HENT:     Head: Normocephalic and atraumatic.  Eyes:     Extraocular Movements: Extraocular movements intact.     Conjunctiva/sclera: Conjunctivae normal.     Pupils: Pupils are equal, round, and reactive to light.  Cardiovascular:     Rate and Rhythm: Normal rate and regular rhythm.     Pulses: Normal pulses.     Heart sounds: Normal heart sounds.  Pulmonary:     Effort: Pulmonary effort is normal.  No respiratory distress.     Breath sounds: Normal breath sounds. No wheezing, rhonchi or rales.  Abdominal:     General: Abdomen is flat. Bowel sounds are normal. There is no distension.     Palpations: Abdomen is soft.     Tenderness: There is no abdominal tenderness.  Musculoskeletal:        General: No deformity. Normal range of motion.  Skin:    General: Skin is warm and dry.     Coloration: Skin is not jaundiced.  Neurological:     General: No focal deficit present.     Mental Status: He is alert and oriented to person, place, and time. Mental status is at baseline.   Labs on Admission: I have personally reviewed following labs and imaging studies  CBC: Recent Labs  Lab 09/11/24 1931  WBC 17.0*  HGB 12.5  HCT 37.7  MCV 92.9  PLT 354   Basic Metabolic Panel: Recent Labs  Lab 09/11/24 1931  NA 141  K 3.5  CL 104  CO2 24  GLUCOSE 105*  BUN 8  CREATININE 0.74  CALCIUM 9.7   GFR: Estimated Creatinine Clearance: 54.8 mL/min (by C-G formula based on SCr of 0.74 mg/dL). Liver Function Tests: No results for input(s): AST, ALT, ALKPHOS, BILITOT, PROT, ALBUMIN in the last 168 hours. No results for input(s):  LIPASE, AMYLASE in the last 168 hours. No results for input(s): AMMONIA in the last 168 hours. Coagulation Profile: No results for input(s): INR, PROTIME in the last 168 hours. Cardiac Enzymes: No results for input(s): CKTOTAL, CKMB, CKMBINDEX, TROPONINI in the last 168 hours. BNP (last 3 results) Recent Labs    09/11/24 1931  PROBNP 265.0   HbA1C: No results for input(s): HGBA1C in the last 72 hours. CBG: No results for input(s): GLUCAP in the last 168 hours. Lipid Profile: No results for input(s): CHOL, HDL, LDLCALC, TRIG, CHOLHDL, LDLDIRECT in the last 72 hours. Thyroid  Function Tests: No results for input(s): TSH, T4TOTAL, FREET4, T3FREE, THYROIDAB in the last 72 hours. Anemia Panel: No results for input(s): VITAMINB12, FOLATE, FERRITIN, TIBC, IRON, RETICCTPCT in the last 72 hours. Urine analysis:    Component Value Date/Time   COLORURINE YELLOW 12/19/2013 2240   APPEARANCEUR CLOUDY (A) 12/19/2013 2240   LABSPEC 1.020 12/19/2013 2240   PHURINE 8.0 12/19/2013 2240   GLUCOSEU NEGATIVE 12/19/2013 2240   HGBUR NEGATIVE 12/19/2013 2240   BILIRUBINUR NEGATIVE 12/19/2013 2240   KETONESUR NEGATIVE 12/19/2013 2240   PROTEINUR NEGATIVE 12/19/2013 2240   UROBILINOGEN 0.2 12/19/2013 2240   NITRITE NEGATIVE 12/19/2013 2240   LEUKOCYTESUR NEGATIVE 12/19/2013 2240    Radiological Exams on Admission: DG Chest 2 View Result Date: 09/11/2024 CLINICAL DATA:  Chest pain EXAM: CHEST - 2 VIEW COMPARISON:  08/10/2024 chest x-ray and 05/15/2024 chest CT FINDINGS: The cardiac silhouette, mediastinal and hilar contours are within normal limits and stable. Stable tortuosity and calcification of the thoracic aorta. Stable left thyroid  goiter with deviation of the trachea rightward. Low lung volumes with vascular crowding and streaky bibasilar atelectasis. Subtle Kerley B-lines suggest interstitial edema. No pleural effusions or focal  infiltrates. IMPRESSION: 1. Low lung volumes with vascular crowding and streaky bibasilar atelectasis. 2. Suspect mild interstitial edema. Electronically Signed   By: MYRTIS Stammer M.D.   On: 09/11/2024 19:00    EKG: Independently reviewed.   Assessment/Plan Principal Problem:   Chest pain Active Problems:   QT prolongation   Type 2 diabetes mellitus (HCC)   Essential hypertension  Chest pain Will admit and trend troponins Provide with nitroglycerin  sublingual Started on metoprolol  12.5 mg twice daily Aspirin  81 mg  Will provide morphine  prn as needed for pain and symptom control Cta chest has been ordered for further evaluation D-dimer has also been ordered  Respiratory pcr to ruleout possible viral etiology  Patient has been started empirically on IV antibiotics  Diabetes type 2 Patient will be started on low dose sliding scale insulin  Will add basal dose insulin  as needed  QT prolongation Qtc demonstrated interval of 597 Repeat EKG demonstrated decrease Will continue to monitor Avoid Qtc prolonging medications at this time  Essential hypertension Continue Norvasc  10 mg daily Continue Benicar  40 mg Spironolactone  at this time  GERD Protonix  40 mg daily  Rheumatoid Arthritis Patient is on methotrexate 7.5 mg twice per week  DVT prophylaxis:  lovenox  Code Status: full  Family Communication: daughter   Disposition Plan:  Patient class is not currently inpatient or observation. Update patient class prior to completing documentation    Consults called: cardiology Admission status:  Level of care: Level of care:  The medical decision making on this patient was of high complexity and the patient is at high risk for clinical deterioration, therefore this is a level 3 visit.  The medical decision making is of moderate complexity, therefore this is a level 2 visit.  Bradly MARLA Drones MD [facility]  If 7PM-7AM, please contact  night-coverage [email or web address]  09/11/2024, 11:22 PM

## 2024-09-11 NOTE — ED Triage Notes (Signed)
 Pt arrives to triage from urgent care with complaints of soreness in my chest that began Wednesday evening. Pt states that the soreness is on the LEFT side, and is exacerbated with breathing. Pt also reports that her ankles are more swollen than baseline.

## 2024-09-12 ENCOUNTER — Inpatient Hospital Stay (HOSPITAL_COMMUNITY)

## 2024-09-12 DIAGNOSIS — E042 Nontoxic multinodular goiter: Secondary | ICD-10-CM | POA: Diagnosis not present

## 2024-09-12 DIAGNOSIS — R9431 Abnormal electrocardiogram [ECG] [EKG]: Secondary | ICD-10-CM | POA: Diagnosis present

## 2024-09-12 DIAGNOSIS — R079 Chest pain, unspecified: Secondary | ICD-10-CM | POA: Diagnosis not present

## 2024-09-12 LAB — URINALYSIS, ROUTINE W REFLEX MICROSCOPIC
Bacteria, UA: NONE SEEN
Bilirubin Urine: NEGATIVE
Glucose, UA: NEGATIVE mg/dL
Hgb urine dipstick: NEGATIVE
Ketones, ur: 5 mg/dL — AB
Leukocytes,Ua: NEGATIVE
Nitrite: NEGATIVE
Protein, ur: 100 mg/dL — AB
Specific Gravity, Urine: >1.046 — ABNORMAL HIGH (ref 1.005–1.030)
pH: 6 (ref 5.0–8.0)

## 2024-09-12 LAB — TSH: TSH: 1.37 u[IU]/mL (ref 0.350–4.500)

## 2024-09-12 LAB — CREATININE, SERUM
Creatinine, Ser: 0.75 mg/dL (ref 0.44–1.00)
GFR, Estimated: >60 mL/min (ref >60)

## 2024-09-12 LAB — ECHOCARDIOGRAM COMPLETE
Area-P 1/2: 2.93 cm2
Calc EF: 61.9 %
Height: 60 in
S' Lateral: 2.9 cm
Single Plane A2C EF: 67.7 %
Single Plane A4C EF: 54.6 %
Weight: 2894.4 [oz_av]

## 2024-09-12 LAB — RESPIRATORY PANEL BY PCR

## 2024-09-12 LAB — PROCALCITONIN: Procalcitonin: 0.11 ng/mL

## 2024-09-12 LAB — CBC
HCT: 33.5 % — ABNORMAL LOW (ref 36.0–46.0)
Hemoglobin: 11.2 g/dL — ABNORMAL LOW (ref 12.0–15.0)
MCH: 30.9 pg (ref 26.0–34.0)
MCHC: 33.4 g/dL (ref 30.0–36.0)
MCV: 92.3 fL (ref 80.0–100.0)
Platelets: 315 K/uL (ref 150–400)
RBC: 3.63 MIL/uL — ABNORMAL LOW (ref 3.87–5.11)
RDW: 17 % — ABNORMAL HIGH (ref 11.5–15.5)
WBC: 16 K/uL — ABNORMAL HIGH (ref 4.0–10.5)
nRBC: 0 % (ref 0.0–0.2)

## 2024-09-12 LAB — GLUCOSE, CAPILLARY
Glucose-Capillary: 130 mg/dL — ABNORMAL HIGH (ref 70–99)
Glucose-Capillary: 171 mg/dL — ABNORMAL HIGH (ref 70–99)

## 2024-09-12 LAB — TROPONIN T, HIGH SENSITIVITY: Troponin T High Sensitivity: 20 ng/L — ABNORMAL HIGH (ref 0–19)

## 2024-09-12 LAB — D-DIMER, QUANTITATIVE: D-Dimer, Quant: 3.71 ug{FEU}/mL — ABNORMAL HIGH (ref 0.00–0.50)

## 2024-09-12 MED ORDER — IRBESARTAN 300 MG PO TABS
300.0000 mg | ORAL_TABLET | Freq: Every day | ORAL | Status: DC
  Administered 2024-09-12 – 2024-09-14 (×3): 300 mg via ORAL
  Filled 2024-09-12 (×3): qty 1

## 2024-09-12 MED ORDER — FUROSEMIDE 20 MG PO TABS
20.0000 mg | ORAL_TABLET | ORAL | Status: DC
  Administered 2024-09-13: 20 mg via ORAL
  Filled 2024-09-12: qty 1

## 2024-09-12 MED ORDER — METOPROLOL TARTRATE 25 MG PO TABS
12.5000 mg | ORAL_TABLET | Freq: Two times a day (BID) | ORAL | Status: DC
  Administered 2024-09-12 (×2): 12.5 mg via ORAL
  Filled 2024-09-12 (×2): qty 1

## 2024-09-12 MED ORDER — IOHEXOL 300 MG/ML  SOLN
75.0000 mL | Freq: Once | INTRAMUSCULAR | Status: AC | PRN
  Administered 2024-09-12: 75 mL via INTRAVENOUS

## 2024-09-12 MED ORDER — NITROGLYCERIN 0.4 MG SL SUBL
0.4000 mg | SUBLINGUAL_TABLET | SUBLINGUAL | Status: DC | PRN
  Administered 2024-09-13: 0.4 mg via SUBLINGUAL
  Filled 2024-09-12: qty 1

## 2024-09-12 MED ORDER — INSULIN ASPART 100 UNIT/ML IJ SOLN: 0.0000 [IU] | Freq: Every day | INTRAMUSCULAR | Status: DC

## 2024-09-12 MED ORDER — FUROSEMIDE 40 MG PO TABS
40.0000 mg | ORAL_TABLET | ORAL | Status: DC
  Administered 2024-09-12 – 2024-09-14 (×2): 40 mg via ORAL
  Filled 2024-09-12 (×2): qty 1

## 2024-09-12 MED ORDER — ENOXAPARIN SODIUM 80 MG/0.8ML IJ SOSY
80.0000 mg | PREFILLED_SYRINGE | Freq: Two times a day (BID) | INTRAMUSCULAR | Status: DC
  Administered 2024-09-12 – 2024-09-13 (×3): 80 mg via SUBCUTANEOUS
  Filled 2024-09-12 (×3): qty 0.8

## 2024-09-12 MED ORDER — PERFLUTREN LIPID MICROSPHERE
1.0000 mL | INTRAVENOUS | Status: AC | PRN
  Administered 2024-09-12: 2 mL via INTRAVENOUS

## 2024-09-12 MED ORDER — INSULIN ASPART 100 UNIT/ML IJ SOLN
0.0000 [IU] | Freq: Three times a day (TID) | INTRAMUSCULAR | Status: DC
  Administered 2024-09-12 – 2024-09-13 (×2): 1 [IU] via SUBCUTANEOUS
  Administered 2024-09-13 (×2): 2 [IU] via SUBCUTANEOUS
  Administered 2024-09-14: 1 [IU] via SUBCUTANEOUS
  Filled 2024-09-12 (×2): qty 1
  Filled 2024-09-12 (×2): qty 2

## 2024-09-12 MED ORDER — SPIRONOLACTONE 25 MG PO TABS
25.0000 mg | ORAL_TABLET | Freq: Every day | ORAL | Status: DC
  Administered 2024-09-12 – 2024-09-14 (×3): 25 mg via ORAL
  Filled 2024-09-12 (×3): qty 1

## 2024-09-12 MED ORDER — INFLUENZA VAC SPLIT HIGH-DOSE 0.5 ML IM SUSY
0.5000 mL | PREFILLED_SYRINGE | INTRAMUSCULAR | Status: AC
  Administered 2024-09-13: 0.5 mL via INTRAMUSCULAR
  Filled 2024-09-12: qty 0.5

## 2024-09-12 NOTE — Plan of Care (Signed)

## 2024-09-12 NOTE — Progress Notes (Signed)
  Echocardiogram 2D Echocardiogram has been performed.  Tinnie FORBES Gosling RDCS 09/12/2024, 10:45 AM

## 2024-09-12 NOTE — Progress Notes (Signed)
 PROGRESS NOTE    Bethany Ray  FMW:[id number] DOB: 07/04/1946 DOA: 09/11/2024 PCP: Sheryle Carwin, MD    Brief Narrative:   Bethany Ray is a 78 y.o. female with past medical history significant for HTN, chronic diastolic congestive heart failure, HLD, DM2, breast cancer, rheumatoid arthritis who presented to [facility] ED on via POV by direction of [place] urgent [facility] on 09/11/2024 (declined ambulance transport) with complaints of chest pain, lower extremity edema and dyspnea.  Ongoing for the last 4 days.  Patient reports pressure-like pain localized to left chest with radiation to her left arm, worse with deep inspiration.  Also endorses increased lower extremity edema.  Patient took some acetaminophen  without relief of symptoms.  Patient then was brought to urgent care for evaluation with recommendation of proceeding to the ED.  In the ED, temperature 98.4 F, HR 78, RR 20, BP 180/78, SpO2 95% on room air.WBC 16.0, hemoglobin 11.2, platelet count 315.  Sodium 141, potassium 3.5, chloride 104, CO2 24, glucose 105, BUN 8, creatinine 0.75.  High sensitivity troponin 21>20>20; flat.  Urinalysis negative.  CT chest with contrast with no acute findings, mild coronary artery calcification, multinodular thyroid  goiter, small hiatal hernia.  TRH was consulted for admission for further evaluation and management of chest pain, concern for pneumonia with elevated WBC count.  Assessment & Plan:   Chest pain suspect musculoskeletal Patient initially presenting to urgent care with left-sided chest pain associated with dyspnea with radiation to left upper extremity, worse with deep inspiration.  Ongoing for the last 4 days.  Attempted use of acetaminophen  unsuccessful.   Respiratory viral panel negative.  EKG with normal sinus rhythm, no concerning ST elevation/depressions or T wave inversions.  Chest x-ray with low lung volumes with vascular crowding and streaky bibasilar atelectasis, suspect mild interstitial  edema.  D-dimer elevated at 3.71.  CT chest with contrast with no acute findings, mild coronary artery calcification, multinodular thyroid  goiter, small hiatal hernia (unfortunately CT chest angiogram was not performed for evaluation of PE).  TTE with LVEF 55 to 60%, LV with no regional wall motion normalities, LV diastolic parameters were normal, biatrial enlargement, trivial MR, IVC normal in size, no aortic stenosis. -- DDx includes PE, musculoskeletal; less likely ACS given flat troponins; no regional wall motion aromas on TTE and reproducible symptoms on physical exam with palpation of anterior chest -- Check vascular duplex ultrasound bilateral lower extremities -- CT angiogram chest ordered, timed for 12/7 at 2 AM given CT chest with contrast performed earlier this morning -- Start treatment dose Lovenox ; hold home aspirin  -- Continue to monitor on telemetry  Hypertensive urgency Hx HTN Chronic diastolic congestive heart failure, not decompensated Follows with cardiology, Dr. Alvan outpatient.  Blood pressure elevated 180/78 on admission.  BNP 265.0. TTE with LVEF 55 to 60%, LV with no regional wall motion normalities, LV diastolic parameters were normal, biatrial enlargement, trivial MR, IVC normal in size, no aortic stenosis. -- Resume home amlodipine  10 mg p.o. daily, furosemide  alternating 20 mg / 40 mg every other day -- Irbesartan  3 m p.o. daily (substituted for home olmesartan  40 mg p.o. daily) -- Spironolactone  25 mg p.o. daily -- Strict I's and O's and daily weights  Leukocytosis WBC count elevated 17.0 on admission.  Patient complained of chest pain, nonproductive cough. -- WBC 17.0>16.0 -- Doxycycline  100 mg IV every 12 hours -- Ceftriaxone  2 g IV every 24 hours -- CBC in a.m.  Multinodular thyroid  goiter CT chest with findings of multinodular  thyroid  goiter.  TSH 1.370, within normal limits. -- Outpatient follow-up with PCP for consideration of dedicated thyroid   ultrasound  Hyperlipidemia -- Pravastatin  20 mg p.o. daily  DM2 Hemoglobin A1c 7.0 on 05/17/2024, well-controlled.  At baseline on glimepiride  1 mg p.o. daily, metformin  5 5 mg p.o. twice daily. -- Hold oral hypoglycemics while inpatient -- Sensitive SSI for coverage -- CBG before every meal/at bedtime  Rheumatoid arthritis Follows with rheumatology outpatient, currently on methotrexate.  GERD -- Protonix  40 mg p.o. daily   DVT prophylaxis: Lovenox , currently on treatment dose    Code Status: Full Code Family Communication: No family present at bedside this morning  Disposition Plan:  Level of care: Telemetry Status is: Inpatient Remains inpatient appropriate because: Pending further imaging with vascular duplex ultrasound bilateral lower extremities, CT angiogram chest, remains on IV antibiotics    Consultants:  None  Procedures:  TTE Vascular duplex ultrasound bilateral extremities  Antimicrobials:  Doxycycline  12/5>> Ceftriaxone  12/5>>   Subjective: Patient seen examined bedside, lying in bed.  No family present.  Updated patient's daughter via telephone in room.  Continues with intermittent chest discomfort to the left which is reproducible on physical exam on palpation.  Discussed with patient obtaining further imaging with lower extremity ultrasound to rule out DVT and will need repeat CT chest imaging with angiogram to rule out PE given elevated D-dimer and her symptomatology.  Continues to endorse worse chest discomfort with deep inspiration.  Patient is afebrile, nontachycardic and on room air.  Suspect etiology likely musculoskeletal in nature.  No other questions or concerns at this time.  Continues on IV antibiotics for concern of pneumonia, although no signs on imaging to suggest but elevated WBC count.  Restarting home Antivert tensive's.  No other questions or concerns at this time.  Denies headache, no dizziness, no shortness of breath at rest, no  fever/chills/night sweats, no palpitations, no nausea/vomiting/diarrhea, no focal weakness, no fatigue, no paresthesias.  No acute events overnight per nurse staff.  Objective: Vitals:   09/12/24 0201 09/12/24 0437 09/12/24 0628 09/12/24 1035  BP: (!) 177/79 136/60 (!) 167/77 (!) 155/55  Pulse: 81 (!) 56 64 72  Resp:  18 20 14   Temp:  98.5 F (36.9 C) 98.4 F (36.9 C) 98.4 F (36.9 C)  TempSrc:   Oral Oral  SpO2:  95% 96% 97%  Weight:   82.1 kg   Height:   5' (1.524 m)     Intake/Output Summary (Last 24 hours) at 09/12/2024 1248 Last data filed at 09/12/2024 1046 Gross per 24 hour  Intake 590 ml  Output --  Net 590 ml   Filed Weights   09/11/24 1758 09/12/24 0628  Weight: 83.9 kg 82.1 kg    Examination:  Physical Exam: GEN: NAD, alert and oriented x 3, obese HEENT: NCAT, PERRL, EOMI, sclera clear, MMM PULM: CTAB w/o wheezes/crackles, normal respiratory effort, on room air CV: RRR w/o M/G/R, + TTP left chest wall with palpation GI: abd soft, NTND, + BS MSK: 1+ bilateral lower extremity peripheral edema, muscle strength globally intact 5/5 bilateral upper/lower extremities NEURO: CN II-XII intact, no focal deficits, sensation to light touch intact PSYCH: normal mood/affect Integumentary: dry/intact, no rashes or wounds    Data Reviewed: I have personally reviewed following labs and imaging studies  CBC: Recent Labs  Lab 09/11/24 1931 09/12/24 0557  WBC 17.0* 16.0*  HGB 12.5 11.2*  HCT 37.7 33.5*  MCV 92.9 92.3  PLT 354 315   Basic Metabolic  Panel: Recent Labs  Lab 09/11/24 1931 09/12/24 0557  NA 141  --   K 3.5  --   CL 104  --   CO2 24  --   GLUCOSE 105*  --   BUN 8  --   CREATININE 0.74 0.75  CALCIUM 9.7  --    GFR: Estimated Creatinine Clearance: 54.1 mL/min (by C-G formula based on SCr of 0.75 mg/dL). Liver Function Tests: No results for input(s): AST, ALT, ALKPHOS, BILITOT, PROT, ALBUMIN in the last 168 hours. No results for  input(s): LIPASE, AMYLASE in the last 168 hours. No results for input(s): AMMONIA in the last 168 hours. Coagulation Profile: No results for input(s): INR, PROTIME in the last 168 hours. Cardiac Enzymes: No results for input(s): CKTOTAL, CKMB, CKMBINDEX, TROPONINI in the last 168 hours. BNP (last 3 results) Recent Labs    09/11/24 1931  PROBNP 265.0   HbA1C: No results for input(s): HGBA1C in the last 72 hours. CBG: No results for input(s): GLUCAP in the last 168 hours. Lipid Profile: No results for input(s): CHOL, HDL, LDLCALC, TRIG, CHOLHDL, LDLDIRECT in the last 72 hours. Thyroid  Function Tests: Recent Labs    09/12/24 0907  TSH 1.370   Anemia Panel: No results for input(s): VITAMINB12, FOLATE, FERRITIN, TIBC, IRON, RETICCTPCT in the last 72 hours. Sepsis Labs: Recent Labs  Lab 09/12/24 0557  PROCALCITON 0.11    Recent Results (from the past 240 hours)  Respiratory (~20 pathogens) panel by PCR     Status: None   Collection Time: 09/12/24  4:36 AM   Specimen: Nasopharyngeal Swab; Respiratory  Result Value Ref Range Status   Adenovirus NOT DETECTED NOT DETECTED Final   Coronavirus 229E NOT DETECTED NOT DETECTED Final    Comment: (NOTE) The Coronavirus on the Respiratory Panel, DOES NOT test for the novel  Coronavirus (2019 nCoV)    Coronavirus HKU1 NOT DETECTED NOT DETECTED Final   Coronavirus NL63 NOT DETECTED NOT DETECTED Final   Coronavirus OC43 NOT DETECTED NOT DETECTED Final   Metapneumovirus NOT DETECTED NOT DETECTED Final   Rhinovirus / Enterovirus NOT DETECTED NOT DETECTED Final   Influenza A NOT DETECTED NOT DETECTED Final   Influenza B NOT DETECTED NOT DETECTED Final   Parainfluenza Virus 1 NOT DETECTED NOT DETECTED Final   Parainfluenza Virus 2 NOT DETECTED NOT DETECTED Final   Parainfluenza Virus 3 NOT DETECTED NOT DETECTED Final   Parainfluenza Virus 4 NOT DETECTED NOT DETECTED Final   Respiratory  Syncytial Virus NOT DETECTED NOT DETECTED Final   Bordetella pertussis NOT DETECTED NOT DETECTED Final   Bordetella Parapertussis NOT DETECTED NOT DETECTED Final   Chlamydophila pneumoniae NOT DETECTED NOT DETECTED Final   Mycoplasma pneumoniae NOT DETECTED NOT DETECTED Final    Comment: Performed at [facility], [address], [place], [place] [address]         Radiology Studies: ECHOCARDIOGRAM COMPLETE Result Date: 09/12/2024    ECHOCARDIOGRAM REPORT   Patient Name:   Bethany Ray Date of Exam: 09/12/2024 Medical Rec #:  [id number]       Height:       60.0 in Accession #:    [id number]      Weight:       180.9 lb Date of Birth:  17-May-1946        BSA:          1.789 m Patient Age:    78 years        BP:  167/77 mmHg Patient Gender: F               HR:           55 bpm. Exam Location:  Inpatient Procedure: 2D Echo, Color Doppler, Cardiac Doppler and Intracardiac            Opacification Agent (Both Spectral and Color Flow Doppler were            utilized during procedure). Indications:    Chest Pain R07.9  History:        Patient has prior history of Echocardiogram examinations, most                 recent 05/16/2024. Risk Factors:Diabetes and Hypertension.  Sonographer:    Tinnie Gosling RDCS Referring Phys: [id number] BRADLY POUR STEPHENS IMPRESSIONS  1. Left ventricular ejection fraction, by estimation, is 55 to 60%. The left ventricle has normal function. The left ventricle has no regional wall motion abnormalities. Left ventricular diastolic parameters were normal.  2. Right ventricular systolic function is normal. The right ventricular size is normal.  3. Left atrial size was mildly dilated.  4. Right atrial size was mildly dilated.  5. The mitral valve is abnormal. Trivial mitral valve regurgitation. No evidence of mitral stenosis. Moderate mitral annular calcification.  6. The aortic valve is tricuspid. There is mild calcification of the aortic valve. Aortic valve regurgitation is not  visualized. Aortic valve sclerosis is present, with no evidence of aortic valve stenosis.  7. The inferior vena cava is normal in size with greater than 50% respiratory variability, suggesting right atrial pressure of 3 mmHg. FINDINGS  Left Ventricle: Left ventricular ejection fraction, by estimation, is 55 to 60%. The left ventricle has normal function. The left ventricle has no regional wall motion abnormalities. Definity  contrast agent was given IV to delineate the left ventricular  endocardial borders. Strain was performed and the global longitudinal strain is indeterminate. The left ventricular internal cavity size was normal in size. There is no left ventricular hypertrophy. Left ventricular diastolic parameters were normal. Right Ventricle: The right ventricular size is normal. No increase in right ventricular wall thickness. Right ventricular systolic function is normal. Left Atrium: Left atrial size was mildly dilated. Right Atrium: Right atrial size was mildly dilated. Pericardium: There is no evidence of pericardial effusion. Mitral Valve: The mitral valve is abnormal. There is mild thickening of the mitral valve leaflet(s). There is mild calcification of the mitral valve leaflet(s). Moderate mitral annular calcification. Trivial mitral valve regurgitation. No evidence of mitral valve stenosis. Tricuspid Valve: The tricuspid valve is normal in structure. Tricuspid valve regurgitation is mild . No evidence of tricuspid stenosis. Aortic Valve: The aortic valve is tricuspid. There is mild calcification of the aortic valve. Aortic valve regurgitation is not visualized. Aortic valve sclerosis is present, with no evidence of aortic valve stenosis. Pulmonic Valve: The pulmonic valve was normal in structure. Pulmonic valve regurgitation is trivial. No evidence of pulmonic stenosis. Aorta: The aortic root is normal in size and structure. Venous: The inferior vena cava is normal in size with greater than 50%  respiratory variability, suggesting right atrial pressure of 3 mmHg. IAS/Shunts: No atrial level shunt detected by color flow Doppler. Additional Comments: 3D was performed not requiring image post processing on an independent workstation and was indeterminate.  LEFT VENTRICLE PLAX 2D LVIDd:         4.40 cm      Diastology LVIDs:  2.90 cm      LV e' medial:    8.38 cm/s LV PW:         1.20 cm      LV E/e' medial:  12.6 LV IVS:        1.10 cm      LV e' lateral:   8.49 cm/s LVOT diam:     1.80 cm      LV E/e' lateral: 12.5 LV SV:         84 LV SV Index:   47 LVOT Area:     2.54 cm LV IVRT:       95 msec  LV Volumes (MOD) LV vol d, MOD A2C: 105.0 ml LV vol d, MOD A4C: 112.0 ml LV vol s, MOD A2C: 33.9 ml LV vol s, MOD A4C: 50.8 ml LV SV MOD A2C:     71.1 ml LV SV MOD A4C:     112.0 ml LV SV MOD BP:      68.2 ml RIGHT VENTRICLE             IVC RV S prime:     10.70 cm/s  IVC diam: 1.50 cm TAPSE (M-mode): 2.4 cm LEFT ATRIUM             Index        RIGHT ATRIUM           Index LA diam:        4.00 cm 2.24 cm/m   RA Area:     19.90 cm LA Vol (A2C):   60.2 ml 33.66 ml/m  RA Volume:   67.30 ml  37.63 ml/m LA Vol (A4C):   57.6 ml 32.20 ml/m LA Biplane Vol: 62.0 ml 34.66 ml/m  AORTIC VALVE LVOT Vmax:   143.00 cm/s LVOT Vmean:  94.500 cm/s LVOT VTI:    0.329 m  AORTA Ao Root diam: 2.50 cm Ao Asc diam:  2.70 cm MITRAL VALVE                TRICUSPID VALVE MV Area (PHT): 2.93 cm     TR Peak grad:   20.2 mmHg MV E velocity: 106.00 cm/s  TR Vmax:        225.00 cm/s MV A velocity: 110.00 cm/s MV E/A ratio:  0.96         SHUNTS                             Systemic VTI:  0.33 m                             Systemic Diam: 1.80 cm Maude Emmer MD Electronically signed by Maude Emmer MD Signature Date/Time: 09/12/2024/11:20:27 AM    Final    CT CHEST W CONTRAST Result Date: 09/12/2024 EXAM: CT CHEST WITH CONTRAST 09/12/2024 01:04:26 AM TECHNIQUE: CT of the chest was performed with the administration of 75 mL of iohexol   (OMNIPAQUE ) 300 MG/ML solution. Multiplanar reformatted images are provided for review. Automated exposure control, iterative reconstruction, and/or weight based adjustment of the mA/kV was utilized to reduce the radiation dose to as low as reasonably achievable. COMPARISON: None available. CLINICAL HISTORY: Chest pain, nonspecific. FINDINGS: MEDIASTINUM: Heart: Mild coronary artery calcification. Global cardiac size within normal limits. Trace pericardial effusion. The central airways are clear. Central pulmonary arteries are of normal caliber. Mild atherosclerotic calcification within the thoracic  aorta. No aortic aneurysm. The esophagus is unremarkable. Multinodular thyroid  goiter noted with asymmetric enlargement of the left thyroid  gland. Recommended dedicated thyroid  sonography for further evaluation. LYMPH NODES: No pathologic thoracic adenopathy. LUNGS AND PLEURA: Bibasilar parenchymal scarring. No confluent pulmonary infiltrate. No pneumothorax or pleural effusion. SOFT TISSUES/BONES: Osseous structures are age-appropriate. No acute bone abnormality. No lytic or blastic bone lesion. No acute abnormality of the soft tissues. UPPER ABDOMEN: Limited images of the upper abdomen demonstrates moderate hepatic steatosis. Status post cholecystectomy. Small hiatal hernia. IMPRESSION: 1. No acute findings. 2. Mild coronary artery calcification. 3. Multinodular thyroid  goiter. If indicated, this appeared to be better assessed with dedicated thyroid  sonography. 4. Small hiatal hernia. Electronically signed by: Dorethia Molt MD 09/12/2024 01:16 AM EST RP Workstation: [id number]   DG Chest 2 View Result Date: 09/11/2024 CLINICAL DATA:  Chest pain EXAM: CHEST - 2 VIEW COMPARISON:  08/10/2024 chest x-ray and 05/15/2024 chest CT FINDINGS: The cardiac silhouette, mediastinal and hilar contours are within normal limits and stable. Stable tortuosity and calcification of the thoracic aorta. Stable left thyroid  goiter with  deviation of the trachea rightward. Low lung volumes with vascular crowding and streaky bibasilar atelectasis. Subtle Kerley B-lines suggest interstitial edema. No pleural effusions or focal infiltrates. IMPRESSION: 1. Low lung volumes with vascular crowding and streaky bibasilar atelectasis. 2. Suspect mild interstitial edema. Electronically Signed   By: MYRTIS Stammer M.D.   On: 09/11/2024 19:00        Scheduled Meds:  amLODipine   10 mg Oral QHS   aspirin  EC  81 mg Oral QPM   cyanocobalamin   1,000 mcg Oral Daily   enoxaparin  (LOVENOX ) injection  80 mg Subcutaneous BID   folic acid   1 mg Oral Daily   metoprolol  tartrate  12.5 mg Oral BID   pantoprazole   40 mg Oral Daily   pravastatin   20 mg Oral QPM   Continuous Infusions:  cefTRIAXone  (ROCEPHIN )  IV Stopped (09/12/24 0037)   doxycycline  (VIBRAMYCIN ) IV 100 mg (09/12/24 1156)     LOS: 1 day    Time spent: 53 minutes spent on 09/12/2024 caring for this patient face-to-face including chart review, ordering labs/tests, documenting, discussion with nursing staff, consultants, updating family and interview/physical exam    Camellia PARAS Emma-Lee Oddo, DO [facility] Available via Epic secure chat 7am-7pm After these hours, please refer to coverage provider listed on [email or web address] 09/12/2024, 12:48 PM

## 2024-09-12 NOTE — Progress Notes (Signed)
 Progress Note  Patient Name: Bethany Ray Date of Encounter: 09/12/2024  Primary Cardiologist: Alvan Carrier, MD   Subjective   Chest pain improved. Still present. Hurts to take a deep breath.   Inpatient Medications    Scheduled Meds:  amLODipine   10 mg Oral QHS   aspirin  EC  81 mg Oral QPM   cyanocobalamin   1,000 mcg Oral Daily   enoxaparin  (LOVENOX ) injection  80 mg Subcutaneous BID   folic acid   1 mg Oral Daily   metoprolol  tartrate  12.5 mg Oral BID   pantoprazole   40 mg Oral Daily   pravastatin   20 mg Oral QPM   Continuous Infusions:  cefTRIAXone  (ROCEPHIN )  IV Stopped (09/12/24 0037)   doxycycline  (VIBRAMYCIN ) IV 100 mg (09/12/24 1156)   PRN Meds: acetaminophen  **OR** acetaminophen , butalbital -acetaminophen -caffeine , HYDROcodone -acetaminophen , morphine  injection, nitroGLYCERIN , ondansetron  **OR** ondansetron  (ZOFRAN ) IV, perflutren  lipid microspheres (DEFINITY ) IV suspension   Vital Signs    Vitals:   09/12/24 0201 09/12/24 0437 09/12/24 0628 09/12/24 1035  BP: (!) 177/79 136/60 (!) 167/77 (!) 155/55  Pulse: 81 (!) 56 64 72  Resp:  18 20 14   Temp:  98.5 F (36.9 C) 98.4 F (36.9 C) 98.4 F (36.9 C)  TempSrc:   Oral Oral  SpO2:  95% 96% 97%  Weight:   82.1 kg   Height:   5' (1.524 m)     Intake/Output Summary (Last 24 hours) at 09/12/2024 1202 Last data filed at 09/12/2024 1046 Gross per 24 hour  Intake 590 ml  Output --  Net 590 ml   Filed Weights   09/11/24 1758 09/12/24 0628  Weight: 83.9 kg 82.1 kg    Telemetry    NSR at 66/min - Personally Reviewed  ECG    NSR with no ischemic changes - Personally Reviewed  Physical Exam   GEN: No acute distress.   Neck: No JVD Cardiac: RRR, no murmurs, rubs, or gallops.  Respiratory: Clear to auscultation bilaterally. GI: Soft, nontender, non-distended  MS: No edema; No deformity. Neuro:  Nonfocal  Psych: Normal affect   Labs    Chemistry Recent Labs  Lab 09/11/24 1931  09/12/24 0557  NA 141  --   K 3.5  --   CL 104  --   CO2 24  --   GLUCOSE 105*  --   BUN 8  --   CREATININE 0.74 0.75  CALCIUM 9.7  --   GFRNONAA >60 >60  ANIONGAP 14  --      Hematology Recent Labs  Lab 09/11/24 1931 09/12/24 0557  WBC 17.0* 16.0*  RBC 4.06 3.63*  HGB 12.5 11.2*  HCT 37.7 33.5*  MCV 92.9 92.3  MCH 30.8 30.9  MCHC 33.2 33.4  RDW 17.2* 17.0*  PLT 354 315    Cardiac EnzymesNo results for input(s): TROPONINI in the last 168 hours. No results for input(s): TROPIPOC in the last 168 hours.   BNP Recent Labs  Lab 09/11/24 1931  PROBNP 265.0     DDimer  Recent Labs  Lab 09/12/24 0557  DDIMER 3.71*     Radiology    ECHOCARDIOGRAM COMPLETE Result Date: 09/12/2024    ECHOCARDIOGRAM REPORT   Patient Name:   Bethany Ray Date of Exam: 09/12/2024 Medical Rec #:  [id number]       Height:       60.0 in Accession #:    [id number]      Weight:       180.9 lb Date  of Birth:  Sep 15, 1946        BSA:          1.789 m Patient Age:    78 years        BP:           167/77 mmHg Patient Gender: F               HR:           55 bpm. Exam Location:  Inpatient Procedure: 2D Echo, Color Doppler, Cardiac Doppler and Intracardiac            Opacification Agent (Both Spectral and Color Flow Doppler were            utilized during procedure). Indications:    Chest Pain R07.9  History:        Patient has prior history of Echocardiogram examinations, most                 recent 05/16/2024. Risk Factors:Diabetes and Hypertension.  Sonographer:    Tinnie Gosling RDCS Referring Phys: [id number] BRADLY POUR STEPHENS IMPRESSIONS  1. Left ventricular ejection fraction, by estimation, is 55 to 60%. The left ventricle has normal function. The left ventricle has no regional wall motion abnormalities. Left ventricular diastolic parameters were normal.  2. Right ventricular systolic function is normal. The right ventricular size is normal.  3. Left atrial size was mildly dilated.  4. Right atrial size  was mildly dilated.  5. The mitral valve is abnormal. Trivial mitral valve regurgitation. No evidence of mitral stenosis. Moderate mitral annular calcification.  6. The aortic valve is tricuspid. There is mild calcification of the aortic valve. Aortic valve regurgitation is not visualized. Aortic valve sclerosis is present, with no evidence of aortic valve stenosis.  7. The inferior vena cava is normal in size with greater than 50% respiratory variability, suggesting right atrial pressure of 3 mmHg. FINDINGS  Left Ventricle: Left ventricular ejection fraction, by estimation, is 55 to 60%. The left ventricle has normal function. The left ventricle has no regional wall motion abnormalities. Definity  contrast agent was given IV to delineate the left ventricular  endocardial borders. Strain was performed and the global longitudinal strain is indeterminate. The left ventricular internal cavity size was normal in size. There is no left ventricular hypertrophy. Left ventricular diastolic parameters were normal. Right Ventricle: The right ventricular size is normal. No increase in right ventricular wall thickness. Right ventricular systolic function is normal. Left Atrium: Left atrial size was mildly dilated. Right Atrium: Right atrial size was mildly dilated. Pericardium: There is no evidence of pericardial effusion. Mitral Valve: The mitral valve is abnormal. There is mild thickening of the mitral valve leaflet(s). There is mild calcification of the mitral valve leaflet(s). Moderate mitral annular calcification. Trivial mitral valve regurgitation. No evidence of mitral valve stenosis. Tricuspid Valve: The tricuspid valve is normal in structure. Tricuspid valve regurgitation is mild . No evidence of tricuspid stenosis. Aortic Valve: The aortic valve is tricuspid. There is mild calcification of the aortic valve. Aortic valve regurgitation is not visualized. Aortic valve sclerosis is present, with no evidence of aortic valve  stenosis. Pulmonic Valve: The pulmonic valve was normal in structure. Pulmonic valve regurgitation is trivial. No evidence of pulmonic stenosis. Aorta: The aortic root is normal in size and structure. Venous: The inferior vena cava is normal in size with greater than 50% respiratory variability, suggesting right atrial pressure of 3 mmHg. IAS/Shunts: No atrial level shunt detected by color  flow Doppler. Additional Comments: 3D was performed not requiring image post processing on an independent workstation and was indeterminate.  LEFT VENTRICLE PLAX 2D LVIDd:         4.40 cm      Diastology LVIDs:         2.90 cm      LV e' medial:    8.38 cm/s LV PW:         1.20 cm      LV E/e' medial:  12.6 LV IVS:        1.10 cm      LV e' lateral:   8.49 cm/s LVOT diam:     1.80 cm      LV E/e' lateral: 12.5 LV SV:         84 LV SV Index:   47 LVOT Area:     2.54 cm LV IVRT:       95 msec  LV Volumes (MOD) LV vol d, MOD A2C: 105.0 ml LV vol d, MOD A4C: 112.0 ml LV vol s, MOD A2C: 33.9 ml LV vol s, MOD A4C: 50.8 ml LV SV MOD A2C:     71.1 ml LV SV MOD A4C:     112.0 ml LV SV MOD BP:      68.2 ml RIGHT VENTRICLE             IVC RV S prime:     10.70 cm/s  IVC diam: 1.50 cm TAPSE (M-mode): 2.4 cm LEFT ATRIUM             Index        RIGHT ATRIUM           Index LA diam:        4.00 cm 2.24 cm/m   RA Area:     19.90 cm LA Vol (A2C):   60.2 ml 33.66 ml/m  RA Volume:   67.30 ml  37.63 ml/m LA Vol (A4C):   57.6 ml 32.20 ml/m LA Biplane Vol: 62.0 ml 34.66 ml/m  AORTIC VALVE LVOT Vmax:   143.00 cm/s LVOT Vmean:  94.500 cm/s LVOT VTI:    0.329 m  AORTA Ao Root diam: 2.50 cm Ao Asc diam:  2.70 cm MITRAL VALVE                TRICUSPID VALVE MV Area (PHT): 2.93 cm     TR Peak grad:   20.2 mmHg MV E velocity: 106.00 cm/s  TR Vmax:        225.00 cm/s MV A velocity: 110.00 cm/s MV E/A ratio:  0.96         SHUNTS                             Systemic VTI:  0.33 m                             Systemic Diam: 1.80 cm Maude Emmer MD  Electronically signed by Maude Emmer MD Signature Date/Time: 09/12/2024/11:20:27 AM    Final    CT CHEST W CONTRAST Result Date: 09/12/2024 EXAM: CT CHEST WITH CONTRAST 09/12/2024 01:04:26 AM TECHNIQUE: CT of the chest was performed with the administration of 75 mL of iohexol  (OMNIPAQUE ) 300 MG/ML solution. Multiplanar reformatted images are provided for review. Automated exposure control, iterative reconstruction, and/or weight based adjustment of the mA/kV was utilized to reduce the  radiation dose to as low as reasonably achievable. COMPARISON: None available. CLINICAL HISTORY: Chest pain, nonspecific. FINDINGS: MEDIASTINUM: Heart: Mild coronary artery calcification. Global cardiac size within normal limits. Trace pericardial effusion. The central airways are clear. Central pulmonary arteries are of normal caliber. Mild atherosclerotic calcification within the thoracic aorta. No aortic aneurysm. The esophagus is unremarkable. Multinodular thyroid  goiter noted with asymmetric enlargement of the left thyroid  gland. Recommended dedicated thyroid  sonography for further evaluation. LYMPH NODES: No pathologic thoracic adenopathy. LUNGS AND PLEURA: Bibasilar parenchymal scarring. No confluent pulmonary infiltrate. No pneumothorax or pleural effusion. SOFT TISSUES/BONES: Osseous structures are age-appropriate. No acute bone abnormality. No lytic or blastic bone lesion. No acute abnormality of the soft tissues. UPPER ABDOMEN: Limited images of the upper abdomen demonstrates moderate hepatic steatosis. Status post cholecystectomy. Small hiatal hernia. IMPRESSION: 1. No acute findings. 2. Mild coronary artery calcification. 3. Multinodular thyroid  goiter. If indicated, this appeared to be better assessed with dedicated thyroid  sonography. 4. Small hiatal hernia. Electronically signed by: Dorethia Molt MD 09/12/2024 01:16 AM EST RP Workstation: [id number]   DG Chest 2 View Result Date: 09/11/2024 CLINICAL DATA:  Chest  pain EXAM: CHEST - 2 VIEW COMPARISON:  08/10/2024 chest x-ray and 05/15/2024 chest CT FINDINGS: The cardiac silhouette, mediastinal and hilar contours are within normal limits and stable. Stable tortuosity and calcification of the thoracic aorta. Stable left thyroid  goiter with deviation of the trachea rightward. Low lung volumes with vascular crowding and streaky bibasilar atelectasis. Subtle Kerley B-lines suggest interstitial edema. No pleural effusions or focal infiltrates. IMPRESSION: 1. Low lung volumes with vascular crowding and streaky bibasilar atelectasis. 2. Suspect mild interstitial edema. Electronically Signed   By: MYRTIS Stammer M.D.   On: 09/11/2024 19:00    Cardiac Studies   See above  Patient Profile     78 y.o. female admitted with plueritic chest pain and an elevated D -dimer.  Assessment & Plan    Pleuritic chest pain - note elevated D-dimer and spiral CT pending. No additional cardiac workup for now. She should have a PE. Await spiral CT.     For questions or updates, please contact [facility] Please consult [email or web address] for contact info under Cardiology/STEMI.      Signed, Danelle Birmingham, MD  09/12/2024, 12:02 PM

## 2024-09-12 NOTE — ED Notes (Signed)
 Gave the PT a sandwich and a drink.

## 2024-09-12 NOTE — Progress Notes (Signed)
 PHARMACY - ANTICOAGULATION CONSULT NOTE  Pharmacy Consult for Enoxaparin  Indication: r/o PE/DVT  Allergies  Allergen Reactions   Penicillins Nausea And Vomiting    Childhood allergy  Other Reaction(s): Not available  Product containing penicillin and antibiotic (product)    Patient Measurements: Height: 5' (152.4 cm) Weight: 82.1 kg (180 lb 14.4 oz) IBW/kg (Calculated) : 45.5 HEPARIN DW (KG): 64.4  Vital Signs: Temp: 98.4 F (36.9 C) (12/06 0628) Temp Source: Oral (12/06 0628) BP: 167/77 (12/06 0628) Pulse Rate: 64 (12/06 0628)  Labs: Recent Labs    09/11/24 1931 09/12/24 0557  HGB 12.5 11.2*  HCT 37.7 33.5*  PLT 354 315  CREATININE 0.74 0.75    Estimated Creatinine Clearance: 54.1 mL/min (by C-G formula based on SCr of 0.75 mg/dL).   Medical History: Past Medical History:  Diagnosis Date   Arthritis    Breast cancer (HCC) 09/14/2013   Cancer (HCC) approx 2006   rt breast/lumpectomy/chemo/rad tx   Diabetes mellitus    Diastolic dysfunction    High cholesterol    Hypertension    Pulmonary edema    Shingles     Medications:  Medications Prior to Admission  Medication Sig Dispense Refill Last Dose/Taking   acetaminophen  (TYLENOL ) 500 MG tablet Take 500 mg by mouth every 8 (eight) hours as needed for moderate pain.   09/11/2024   amLODipine  (NORVASC ) 10 MG tablet Take 10 mg by mouth daily.   09/10/2024   aspirin  EC 81 MG tablet Take 81 mg by mouth daily.   09/10/2024 Evening   cyanocobalamin  1000 MCG tablet Take 1 tablet (1,000 mcg total) by mouth daily. 30 tablet 0 09/11/2024 Morning   diclofenac  Sodium (VOLTAREN ) 1 % GEL Apply 2 g topically 4 (four) times daily. (Patient taking differently: Apply 2 g topically 4 (four) times daily as needed.) 20 g 0 Unknown   fluticasone  (FLONASE ) 50 MCG/ACT nasal spray Place 2 sprays into both nostrils daily. (Patient taking differently: Place 2 sprays into both nostrils daily as needed.) 16 g 0 Unknown   folic acid   (FOLVITE ) 1 MG tablet Take 1 mg by mouth daily.   09/11/2024   furosemide  (LASIX ) 40 MG tablet Take 40 mg Daily  alternating with 20 mg Daily 90 tablet 3 09/11/2024   glimepiride  (AMARYL ) 1 MG tablet Take 1 mg by mouth daily with breakfast.    09/11/2024   lidocaine  (LIDODERM ) 5 % Place 1 patch onto the skin daily. Remove & Discard patch within 12 hours or as directed by MD (Patient taking differently: Place 1 patch onto the skin daily as needed. Remove & Discard patch within 12 hours or as directed by MD) 30 patch 0 Unknown   loperamide (IMODIUM A-D) 2 MG tablet Take 2-4 mg by mouth 4 (four) times daily as needed for diarrhea or loose stools.   Unknown   metFORMIN  (GLUCOPHAGE -XR) 500 MG 24 hr tablet Take 500 mg by mouth 2 (two) times daily.    09/11/2024   methotrexate (RHEUMATREX) 2.5 MG tablet Take 7.5 mg by mouth 2 (two) times a week. Thursdays and Fridays   09/10/2024   olmesartan  (BENICAR ) 40 MG tablet Take 1 tablet (40 mg total) by mouth daily. 30 tablet 0 09/10/2024   pantoprazole  (PROTONIX ) 40 MG tablet Take 1 tablet (40 mg total) by mouth daily. 30 tablet 0 09/10/2024   pravastatin  (PRAVACHOL ) 20 MG tablet Take 20 mg by mouth every evening.    09/10/2024 Evening   spironolactone  (ALDACTONE ) 25 MG tablet Take 25  mg by mouth daily.   09/10/2024 Evening   Turmeric Curcumin 500 MG CAPS Take 500 mg by mouth daily.   09/11/2024 Morning   valACYclovir (VALTREX) 1000 MG tablet Take 1,000 mg by mouth 3 (three) times daily.   09/11/2024   cetirizine  (ZYRTEC  ALLERGY) 10 MG tablet Take 1 tablet (10 mg total) by mouth daily. (Patient not taking: Reported on 09/11/2024) 30 tablet 0 Not Taking   Scheduled:   amLODipine   10 mg Oral QHS   aspirin  EC  81 mg Oral QPM   cyanocobalamin   1,000 mcg Oral Daily   enoxaparin  (LOVENOX ) injection  80 mg Subcutaneous BID   folic acid   1 mg Oral Daily   metoprolol  tartrate  12.5 mg Oral BID   pantoprazole   40 mg Oral Daily   pravastatin   20 mg Oral QPM   PRN: acetaminophen   **OR** acetaminophen , butalbital -acetaminophen -caffeine , HYDROcodone -acetaminophen , morphine  injection, nitroGLYCERIN , ondansetron  **OR** ondansetron  (ZOFRAN ) IV  Assessment: 78 y.o. female admitted 09/11/2024 for chest pain and nonproductive cough following trip back from the beach last week. D-dimer returned elevated this AM, and CTA has been ordered. Pharmacy to dose Lovenox  for VTE r/o.  CBC: Hgb WNL on admit, but slightly low this AM (likely dilutional); Plt stable WNL SCr: WNL (baseline < 1) Previous anticoagulation: none PTA, currently on ASA as part of chest pain workup   Goal of Therapy: Anti-Xa level 0.6-1 units/ml 4hrs after LMWH dose given  Plan: Lovenox  80 mg SQ q12 hr Per d/w MD, hold ASA for now pending study results (below), as Tn1 borderline low and flat F/u CTA & LE US  results Monitor for signs of bleeding or worsening thrombosis Pharmacy will follow peripherally for renal adjustments or changes in clinical status   Bard Jeans, PharmD, BCPS [phone] 09/12/2024, 10:07 AM

## 2024-09-13 ENCOUNTER — Inpatient Hospital Stay (HOSPITAL_COMMUNITY)

## 2024-09-13 DIAGNOSIS — R079 Chest pain, unspecified: Secondary | ICD-10-CM

## 2024-09-13 DIAGNOSIS — R091 Pleurisy: Secondary | ICD-10-CM

## 2024-09-13 LAB — CBC
HCT: 34.7 % — ABNORMAL LOW (ref 36.0–46.0)
Hemoglobin: 11.2 g/dL — ABNORMAL LOW (ref 12.0–15.0)
MCH: 30.4 pg (ref 26.0–34.0)
MCHC: 32.3 g/dL (ref 30.0–36.0)
MCV: 94 fL (ref 80.0–100.0)
Platelets: 305 K/uL (ref 150–400)
RBC: 3.69 MIL/uL — ABNORMAL LOW (ref 3.87–5.11)
RDW: 17.2 % — ABNORMAL HIGH (ref 11.5–15.5)
WBC: 15.1 K/uL — ABNORMAL HIGH (ref 4.0–10.5)
nRBC: 0 % (ref 0.0–0.2)

## 2024-09-13 LAB — BASIC METABOLIC PANEL WITH GFR
Anion gap: 12 (ref 5–15)
BUN: 10 mg/dL (ref 8–23)
CO2: 21 mmol/L — ABNORMAL LOW (ref 22–32)
Calcium: 9.1 mg/dL (ref 8.9–10.3)
Chloride: 103 mmol/L (ref 98–111)
Creatinine, Ser: 0.77 mg/dL (ref 0.44–1.00)
GFR, Estimated: >60 mL/min (ref >60)
Glucose, Bld: 143 mg/dL — ABNORMAL HIGH (ref 70–99)
Potassium: 4.1 mmol/L (ref 3.5–5.1)
Sodium: 136 mmol/L (ref 135–145)

## 2024-09-13 LAB — GLUCOSE, CAPILLARY
Glucose-Capillary: 148 mg/dL — ABNORMAL HIGH (ref 70–99)
Glucose-Capillary: 165 mg/dL — ABNORMAL HIGH (ref 70–99)
Glucose-Capillary: 166 mg/dL — ABNORMAL HIGH (ref 70–99)
Glucose-Capillary: 166 mg/dL — ABNORMAL HIGH (ref 70–99)

## 2024-09-13 LAB — MAGNESIUM: Magnesium: 1.7 mg/dL (ref 1.7–2.4)

## 2024-09-13 MED ORDER — MAGNESIUM SULFATE 2 GM/50ML IV SOLN
2.0000 g | Freq: Once | INTRAVENOUS | Status: AC
  Administered 2024-09-13: 2 g via INTRAVENOUS
  Filled 2024-09-13: qty 50

## 2024-09-13 MED ORDER — TRAMADOL HCL 50 MG PO TABS: 25.0000 mg | ORAL_TABLET | Freq: Four times a day (QID) | ORAL | Status: DC | PRN

## 2024-09-13 MED ORDER — IOHEXOL 350 MG/ML SOLN
75.0000 mL | Freq: Once | INTRAVENOUS | Status: AC | PRN
  Administered 2024-09-13: 75 mL via INTRAVENOUS

## 2024-09-13 MED ORDER — ASPIRIN 81 MG PO TBEC
81.0000 mg | DELAYED_RELEASE_TABLET | Freq: Every day | ORAL | Status: DC
  Administered 2024-09-13 – 2024-09-14 (×2): 81 mg via ORAL
  Filled 2024-09-13 (×2): qty 1

## 2024-09-13 MED ORDER — LIDOCAINE 5 % EX PTCH
1.0000 | MEDICATED_PATCH | CUTANEOUS | Status: DC
  Administered 2024-09-13 – 2024-09-14 (×2): 1 via TRANSDERMAL
  Filled 2024-09-13 (×2): qty 1

## 2024-09-13 MED ORDER — ACETAMINOPHEN 500 MG PO TABS
500.0000 mg | ORAL_TABLET | Freq: Three times a day (TID) | ORAL | Status: DC
  Administered 2024-09-13 – 2024-09-14 (×3): 500 mg via ORAL
  Filled 2024-09-13 (×3): qty 1

## 2024-09-13 MED ORDER — HYDRALAZINE HCL 25 MG PO TABS
25.0000 mg | ORAL_TABLET | Freq: Two times a day (BID) | ORAL | Status: DC
  Administered 2024-09-13 – 2024-09-14 (×2): 25 mg via ORAL
  Filled 2024-09-13 (×2): qty 1

## 2024-09-13 MED ORDER — ENOXAPARIN SODIUM 40 MG/0.4ML IJ SOSY
40.0000 mg | PREFILLED_SYRINGE | INTRAMUSCULAR | Status: DC
  Administered 2024-09-14: 40 mg via SUBCUTANEOUS
  Filled 2024-09-13: qty 0.4

## 2024-09-13 NOTE — Plan of Care (Signed)
  Problem: Clinical Measurements: Goal: Diagnostic test results will improve Outcome: Progressing Goal: Cardiovascular complication will be avoided Outcome: Progressing   Problem: Activity: Goal: Risk for activity intolerance will decrease Outcome: Progressing   Problem: Pain Managment: Goal: General experience of comfort will improve and/or be controlled Outcome: Progressing   Problem: Safety: Goal: Ability to remain free from injury will improve Outcome: Progressing   Problem: Nutrition: Goal: Adequate nutrition will be maintained Outcome: Adequate for Discharge

## 2024-09-13 NOTE — Care Management CC44 (Signed)
 Condition Code 44 Documentation Completed  Patient Details  Name: Bethany Ray MRN: [id number] Date of Birth: April 03, 1946   Condition Code 44 given:  Yes Patient signature on Condition Code 44 notice:  Yes Documentation of 2 MD's agreement:  Yes Code 44 added to claim:  Yes    Sonda Manuella Quill, RN 09/13/2024, 9:27 AM

## 2024-09-13 NOTE — Care Management Obs Status (Signed)
 MEDICARE OBSERVATION STATUS NOTIFICATION   Patient Details  Name: Bethany Ray MRN: [id number] Date of Birth: 20-Feb-1946   Medicare Observation Status Notification Given:  Yes    Sonda Manuella Quill, RN 09/13/2024, 9:27 AM

## 2024-09-13 NOTE — TOC Initial Note (Signed)
 Transition of Care [facility]) - Initial/Assessment Note    Patient Details  Name: Bethany Ray MRN: [id number] Date of Birth: [birth year]  [facility]) CM/SW Contact:    Sonda Manuella Quill, RN Phone Number: 09/13/2024, 9:32 AM  Clinical Narrative:                 Spoke w/ pt in room; pt said she lives at home w her husband; she plans to return at d/c w/ family support; she identified POCs Jadwiga Faidley (spouse) [phone] / Rexene Louder (dtr) [phone]; her dtr will provide transportation; pt insurance/PCP verified; she denied SDOH risks; she has cane and walker; pt said she does not have HH services or home oxygen; IP CM will follow.  Expected Discharge Plan: Home/Self Care Barriers to Discharge: Continued Medical Work up   Patient Goals and CMS Choice Patient states their goals for this hospitalization and ongoing recovery are:: home          Expected Discharge Plan and Services   Discharge Planning Services: CM Consult   Living arrangements for the past 2 months: Single Family Home                 DME Arranged: N/A DME Agency: NA       HH Arranged: NA HH Agency: NA        Prior Living Arrangements/Services Living arrangements for the past 2 months: Single Family Home Lives with:: Spouse Patient language and need for interpreter reviewed:: Yes Do you feel safe going back to the place where you live?: Yes      Need for Family Participation in Patient Care: Yes (Comment) Care giver support system in place?: Yes (comment) Current home services: DME (cane, walker) Criminal Activity/Legal Involvement Pertinent to Current Situation/Hospitalization: No - Comment as needed  Activities of Daily Living   ADL Screening (condition at time of admission) Independently performs ADLs?: Yes (appropriate for developmental age) Is the patient deaf or have difficulty hearing?: No Does the patient have difficulty seeing, even when wearing glasses/contacts?:  No Does the patient have difficulty concentrating, remembering, or making decisions?: No  Permission Sought/Granted Permission sought to share information with : Case Manager Permission granted to share information with : Yes, Verbal Permission Granted  Share Information with NAME: Case Manager     Permission granted to share info w Relationship: Adina Puzzo (spouse) [phone] / Rexene Louder (dtr) [phone]     Emotional Assessment Appearance:: Appears stated age Attitude/Demeanor/Rapport: Gracious Affect (typically observed): Accepting Orientation: : Oriented to Self, Oriented to Place, Oriented to  Time, Oriented to Situation Alcohol / Substance Use: Not Applicable Psych Involvement: No (comment)  Admission diagnosis:  Chest pain [R07.9] Chest pain, unspecified type [R07.9] Patient Active Problem List   Diagnosis Date Noted   QT prolongation 09/12/2024   Acute pulmonary edema (HCC) 05/16/2024   Uncontrolled type 2 diabetes mellitus with hyperglycemia, without long-term current use of insulin  (HCC) 05/16/2024   Essential hypertension 05/16/2024   Type 2 diabetes mellitus (HCC) 02/05/2023   Loose stools 11/30/2021   Encounter for colonoscopy in patient with family history of colon cancer 05/25/2019   Chest pain 05/25/2019   Unilateral primary osteoarthritis, right knee 02/27/2017   Unilateral primary osteoarthritis, left knee 02/27/2017   Breast cancer of lower-inner quadrant of right female breast (HCC) 09/14/2013   STRESS FRACTURE-SITE 11/19/2007   Sprain of ankle 11/19/2007   PCP:  Sheryle Carwin, MD Pharmacy:   [facility] - [place], [place] - [address] [place] [address] Phone: [phone] Fax: [phone]     Social Drivers of Health (SDOH) Social History: SDOH Screenings   Food Insecurity: No Food Insecurity (09/13/2024)  Housing: Low Risk  (09/13/2024)  Transportation Needs: No Transportation Needs (09/13/2024)  Utilities:  Not At Risk (09/13/2024)  Social Connections: Socially Integrated (09/12/2024)  Tobacco Use: Low Risk  (09/11/2024)   SDOH Interventions: Food Insecurity Interventions: Intervention Not Indicated, Inpatient TOC Housing Interventions: Intervention Not Indicated, Inpatient TOC Transportation Interventions: Intervention Not Indicated, Inpatient TOC Utilities Interventions: Intervention Not Indicated, Inpatient TOC   Readmission Risk Interventions    09/13/2024    9:29 AM  Readmission Risk Prevention Plan  Transportation Screening Complete  PCP or Specialist Appt within 5-7 Days Complete  Home Care Screening Complete  Medication Review (RN CM) Complete

## 2024-09-13 NOTE — Progress Notes (Signed)
 PROGRESS NOTE    Bethany Ray  FMW:[id number] DOB: Jan 23, 1946 DOA: 09/11/2024 PCP: Sheryle Carwin, MD   Brief Narrative: 78 year old with past medical history significant for hypertension, chronic diastolic congestive heart failure, hyperlipidemia, diabetes type 2, breast cancer, rheumatoid arthritis who presented to [facility] by direction of [place] urgent care 09/11/2024 patient declined ambulance transport complaining of chest pain, lower extremity edema and dyspnea.  Symptoms ongoing for the last 4 days.  Chest pain radiates to the left arm worse with deep inspiration.  Patient was found to have troponin at 21 and flat, CT chest without contrast no acute finding, mild coronary artery calcification, multinodular thyroid  goiter.  There was some concern for pneumonia due to leukocytosis.   Assessment & Plan:   Principal Problem:   Chest pain Active Problems:   QT prolongation   Type 2 diabetes mellitus (HCC)   Essential hypertension   1-Chest pain suspect musculoskeletal pain - Patient presented with chest pain on the left side associated with dyspnea radiated to the left upper extremity worse with deep inspiration. - EKG normal sinus rhythm - Chest x-ray low lung volume, vascular crowding and streaky bibasilar atelectasis, suspect mild interstitial edema.  D-dimer elevated.  CT chest with contrast no acute finding mild coronary artery calcification, multinodular thyroid  goiter. -TTE: Left ventricular ejection fraction 55 to 60%, left ventricular with no wall motion abnormality,LV diastolic parameters were normal, biatrial enlargement, trivial MR, IVC normal in size, no aortic stenosis.  - CT angio chest negative for PE, atelectasis at the bases of the lower Chest pain could be related to costochondritis, muscular skeletal Will schedule Tylenol , lidocaine  patch.  As needed tramadol    Hypertensive urgency History of hypertension Chronic diastolic heart failure - Continue amlodipine ,  Lasix , Avapro , spironolactone   Pneumonia Leukocytosis - Patient with leukocytosis, bilateral lower lobe atelectasis versus infiltrate. Due to pleuritic chest pain component will treat for pneumonia continue ceftriaxone  and doxycycline   Multinodular thyroid  goiter - Needs outpatient thyroid  ultrasound  Hyperlipidemia - Continue with Pravachol   Diabetes type 2 - Continue with sliding scale insulin    Rheumatoid arthritis - Continue follow-up with rheumatology  GERD - Continue PPI  Sensitivity to touch;  TSH normal 1.3 Check vitamin D , B 12  Magnesium  1.7 replete IV Estimated body mass index is 35.52 kg/m as calculated from the following:   Height as of this encounter: 5' (1.524 m).   Weight as of this encounter: 82.5 kg.   DVT prophylaxis: Lovenox  Code Status: Full code Family Communication: Daughter who was at bedside Disposition Plan:  Status is: Inpatient Remains inpatient appropriate because: Management of pneumonia chest pain    Consultants:  Cardiology  Procedures:  Echo  Antimicrobials:    Subjective: Patient is still having severe chest right side.  She is not able to take it due to pain.  She has been very sensitive to touch for some type.  Objective: Vitals:   09/12/24 1834 09/12/24 2058 09/13/24 0417 09/13/24 0427  BP: (!) 179/61 (!) 156/59  (!) 162/65  Pulse: 76 65  72  Resp: 14 20  20   Temp: 99.3 F (37.4 C) 98.1 F (36.7 C)  98.2 F (36.8 C)  TempSrc: Oral Oral  Oral  SpO2: 95% 94%  95%  Weight:   82.5 kg   Height:        Intake/Output Summary (Last 24 hours) at 09/13/2024 0835 Last data filed at 09/13/2024 0430 Gross per 24 hour  Intake 454.32 ml  Output 1100 ml  Net -645.68  ml   Filed Weights   09/11/24 1758 09/12/24 0628 09/13/24 0417  Weight: 83.9 kg 82.1 kg 82.5 kg    Examination:  General exam: Appears calm and comfortable  Respiratory system: Clear to auscultation. Respiratory effort normal. Cardiovascular system:  S1 & S2 heard, RRR. No JVD, murmurs, rubs, gallops or clicks. No pedal edema. Gastrointestinal system: Abdomen is nondistended, soft and nontender. No organomegaly or masses felt. Normal bowel sounds heard. Central nervous system: Alert and oriented Extremities: Symmetric 5 x 5 power.   Data Reviewed: I have personally reviewed following labs and imaging studies  CBC: Recent Labs  Lab 09/11/24 1931 09/12/24 0557 09/13/24 0439  WBC 17.0* 16.0* 15.1*  HGB 12.5 11.2* 11.2*  HCT 37.7 33.5* 34.7*  MCV 92.9 92.3 94.0  PLT 354 315 305   Basic Metabolic Panel: Recent Labs  Lab 09/11/24 1931 09/12/24 0557 09/13/24 0439  NA 141  --  136  K 3.5  --  4.1  CL 104  --  103  CO2 24  --  21*  GLUCOSE 105*  --  143*  BUN 8  --  10  CREATININE 0.74 0.75 0.77  CALCIUM 9.7  --  9.1  MG  --   --  1.7   GFR: Estimated Creatinine Clearance: 54.3 mL/min (by C-G formula based on SCr of 0.77 mg/dL). Liver Function Tests: No results for input(s): AST, ALT, ALKPHOS, BILITOT, PROT, ALBUMIN in the last 168 hours. No results for input(s): LIPASE, AMYLASE in the last 168 hours. No results for input(s): AMMONIA in the last 168 hours. Coagulation Profile: No results for input(s): INR, PROTIME in the last 168 hours. Cardiac Enzymes: No results for input(s): CKTOTAL, CKMB, CKMBINDEX, TROPONINI in the last 168 hours. BNP (last 3 results) Recent Labs    09/11/24 1931  PROBNP 265.0   HbA1C: No results for input(s): HGBA1C in the last 72 hours. CBG: Recent Labs  Lab 09/12/24 1715 09/12/24 2058  GLUCAP 130* 171*   Lipid Profile: No results for input(s): CHOL, HDL, LDLCALC, TRIG, CHOLHDL, LDLDIRECT in the last 72 hours. Thyroid  Function Tests: Recent Labs    09/12/24 0907  TSH 1.370   Anemia Panel: No results for input(s): VITAMINB12, FOLATE, FERRITIN, TIBC, IRON, RETICCTPCT in the last 72 hours. Sepsis Labs: Recent Labs  Lab  09/12/24 0557  PROCALCITON 0.11    Recent Results (from the past 240 hours)  Respiratory (~20 pathogens) panel by PCR     Status: None   Collection Time: 09/12/24  4:36 AM   Specimen: Nasopharyngeal Swab; Respiratory  Result Value Ref Range Status   Adenovirus NOT DETECTED NOT DETECTED Final   Coronavirus 229E NOT DETECTED NOT DETECTED Final    Comment: (NOTE) The Coronavirus on the Respiratory Panel, DOES NOT test for the novel  Coronavirus (2019 nCoV)    Coronavirus HKU1 NOT DETECTED NOT DETECTED Final   Coronavirus NL63 NOT DETECTED NOT DETECTED Final   Coronavirus OC43 NOT DETECTED NOT DETECTED Final   Metapneumovirus NOT DETECTED NOT DETECTED Final   Rhinovirus / Enterovirus NOT DETECTED NOT DETECTED Final   Influenza A NOT DETECTED NOT DETECTED Final   Influenza B NOT DETECTED NOT DETECTED Final   Parainfluenza Virus 1 NOT DETECTED NOT DETECTED Final   Parainfluenza Virus 2 NOT DETECTED NOT DETECTED Final   Parainfluenza Virus 3 NOT DETECTED NOT DETECTED Final   Parainfluenza Virus 4 NOT DETECTED NOT DETECTED Final   Respiratory Syncytial Virus NOT DETECTED NOT DETECTED Final  Bordetella pertussis NOT DETECTED NOT DETECTED Final   Bordetella Parapertussis NOT DETECTED NOT DETECTED Final   Chlamydophila pneumoniae NOT DETECTED NOT DETECTED Final   Mycoplasma pneumoniae NOT DETECTED NOT DETECTED Final    Comment: Performed at [facility], [address], [place], [place] [address]         Radiology Studies: CT Angio Chest Pulmonary Embolism (PE) W or WO Contrast Result Date: 09/13/2024 EXAM: CTA of the Chest with contrast for PE 09/13/2024 04:12:52 AM TECHNIQUE: CTA of the chest was performed after the administration of intravenous contrast. Multiplanar reformatted images are provided for review. MIP images are provided for review. Automated exposure control, iterative reconstruction, and/or weight based adjustment of the mA/kV was utilized to reduce the radiation  dose to as low as reasonably achievable. COMPARISON: CT of the chest dated 09/12/2024. CLINICAL HISTORY: Pulmonary embolism (PE) suspected, high prob; Elevated D-dimer. FINDINGS: PULMONARY ARTERIES: Pulmonary arteries are adequately opacified for evaluation. No pulmonary embolism. Main pulmonary artery is normal in caliber. MEDIASTINUM: The heart is enlarged and there is a small pericardial effusion. There is mild calcific atheromatous disease within the thoracic aorta. There is intrathoracic goiter again demonstrated on the left with displacement of the trachea laterally to the right. LYMPH NODES: No mediastinal, hilar or axillary lymphadenopathy. LUNGS AND PLEURA: There are hazy and streaky opacities present independently within the lower lobes bilaterally. No pleural effusion or pneumothorax. UPPER ABDOMEN: There is a small sliding hiatus hernia. SOFT TISSUES AND BONES: No acute bone or soft tissue abnormality. IMPRESSION: 1. No evidence of pulmonary embolus. 2. Hazy and streaky opacities within the lower lobes bilaterally. 3. Enlarged heart with small pericardial effusion. 4. 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 5. Small sliding hiatus hernia. Electronically signed by: Evalene Coho MD 09/13/2024 04:41 AM EST RP Workstation: [id number]   VAS US  LOWER EXTREMITY VENOUS (DVT) Result Date: 09/12/2024  Lower Venous DVT Study Patient Name:  Bethany Ray  Date of Exam:   09/12/2024 Medical Rec #: [id number]        Accession #:    [id number] Date of Birth: Feb 02, 1946         Patient Gender: F Patient Age:   78 years Exam Location:  [facility] Procedure:      VAS US  LOWER EXTREMITY VENOUS (DVT) Referring Phys: ERIC AUSTRIA --------------------------------------------------------------------------------  Indications: Edema, and + D-dimer, chest discomfort.  Comparison Study: Previous study  of the left lower extremity on 7.25.2021 Performing Technologist: Edilia Elden Appl  Examination Guidelines: A complete evaluation includes B-mode imaging, spectral Doppler, color Doppler, and power Doppler as needed of all accessible portions of each vessel. Bilateral testing is considered an integral part of a complete examination. Limited examinations for reoccurring indications may be performed as noted. The reflux portion of the exam is performed with the patient in reverse Trendelenburg.  +---------+---------------+---------+-----------+----------+--------------+ RIGHT    CompressibilityPhasicitySpontaneityPropertiesThrombus Aging +---------+---------------+---------+-----------+----------+--------------+ CFV      Full           Yes      Yes                                 +---------+---------------+---------+-----------+----------+--------------+ SFJ      Full           Yes      Yes                                 +---------+---------------+---------+-----------+----------+--------------+  FV Prox  Full                                                        +---------+---------------+---------+-----------+----------+--------------+ FV Mid   Full                                                        +---------+---------------+---------+-----------+----------+--------------+ FV DistalFull                                                        +---------+---------------+---------+-----------+----------+--------------+ PFV      Full                                                        +---------+---------------+---------+-----------+----------+--------------+ POP      Full           Yes      Yes                                 +---------+---------------+---------+-----------+----------+--------------+ PTV      Full                                                        +---------+---------------+---------+-----------+----------+--------------+  PERO     Full                                                        +---------+---------------+---------+-----------+----------+--------------+   +---------+---------------+---------+-----------+----------+----------------+ LEFT     CompressibilityPhasicitySpontaneityPropertiesThrombus Aging   +---------+---------------+---------+-----------+----------+----------------+ CFV      Full           Yes      Yes                                   +---------+---------------+---------+-----------+----------+----------------+ SFJ      Full           Yes      Yes                                   +---------+---------------+---------+-----------+----------+----------------+ FV Prox  Full                                                          +---------+---------------+---------+-----------+----------+----------------+  FV Mid   Full                                                          +---------+---------------+---------+-----------+----------+----------------+ FV DistalFull                                                          +---------+---------------+---------+-----------+----------+----------------+ PFV      Full                                                          +---------+---------------+---------+-----------+----------+----------------+ POP      Full           Yes      Yes                                   +---------+---------------+---------+-----------+----------+----------------+ PTV      Full                                                          +---------+---------------+---------+-----------+----------+----------------+ PERO                                                  Patent by color. +---------+---------------+---------+-----------+----------+----------------+     Summary: RIGHT: - There is no evidence of deep vein thrombosis in the lower extremity.  - No cystic structure found in the popliteal fossa.  LEFT: -  There is no evidence of deep vein thrombosis in the lower extremity. However, portions of this examination were limited- see technologist comments above.  - No cystic structure found in the popliteal fossa.  *See table(s) above for measurements and observations. Electronically signed by Debby Robertson on 09/12/2024 at 8:13:42 PM.    Final    ECHOCARDIOGRAM COMPLETE Result Date: 09/12/2024    ECHOCARDIOGRAM REPORT   Patient Name:   Bethany Ray Date of Exam: 09/12/2024 Medical Rec #:  [id number]       Height:       60.0 in Accession #:    [id number]      Weight:       180.9 lb Date of Birth:  Feb 14, 1946        BSA:          1.789 m Patient Age:    78 years        BP:           167/77 mmHg Patient Gender: F               HR:           55  bpm. Exam Location:  Inpatient Procedure: 2D Echo, Color Doppler, Cardiac Doppler and Intracardiac            Opacification Agent (Both Spectral and Color Flow Doppler were            utilized during procedure). Indications:    Chest Pain R07.9  History:        Patient has prior history of Echocardiogram examinations, most                 recent 05/16/2024. Risk Factors:Diabetes and Hypertension.  Sonographer:    Tinnie Gosling RDCS Referring Phys: [id number] BRADLY POUR STEPHENS IMPRESSIONS  1. Left ventricular ejection fraction, by estimation, is 55 to 60%. The left ventricle has normal function. The left ventricle has no regional wall motion abnormalities. Left ventricular diastolic parameters were normal.  2. Right ventricular systolic function is normal. The right ventricular size is normal.  3. Left atrial size was mildly dilated.  4. Right atrial size was mildly dilated.  5. The mitral valve is abnormal. Trivial mitral valve regurgitation. No evidence of mitral stenosis. Moderate mitral annular calcification.  6. The aortic valve is tricuspid. There is mild calcification of the aortic valve. Aortic valve regurgitation is not visualized. Aortic valve sclerosis is present, with no  evidence of aortic valve stenosis.  7. The inferior vena cava is normal in size with greater than 50% respiratory variability, suggesting right atrial pressure of 3 mmHg. FINDINGS  Left Ventricle: Left ventricular ejection fraction, by estimation, is 55 to 60%. The left ventricle has normal function. The left ventricle has no regional wall motion abnormalities. Definity  contrast agent was given IV to delineate the left ventricular  endocardial borders. Strain was performed and the global longitudinal strain is indeterminate. The left ventricular internal cavity size was normal in size. There is no left ventricular hypertrophy. Left ventricular diastolic parameters were normal. Right Ventricle: The right ventricular size is normal. No increase in right ventricular wall thickness. Right ventricular systolic function is normal. Left Atrium: Left atrial size was mildly dilated. Right Atrium: Right atrial size was mildly dilated. Pericardium: There is no evidence of pericardial effusion. Mitral Valve: The mitral valve is abnormal. There is mild thickening of the mitral valve leaflet(s). There is mild calcification of the mitral valve leaflet(s). Moderate mitral annular calcification. Trivial mitral valve regurgitation. No evidence of mitral valve stenosis. Tricuspid Valve: The tricuspid valve is normal in structure. Tricuspid valve regurgitation is mild . No evidence of tricuspid stenosis. Aortic Valve: The aortic valve is tricuspid. There is mild calcification of the aortic valve. Aortic valve regurgitation is not visualized. Aortic valve sclerosis is present, with no evidence of aortic valve stenosis. Pulmonic Valve: The pulmonic valve was normal in structure. Pulmonic valve regurgitation is trivial. No evidence of pulmonic stenosis. Aorta: The aortic root is normal in size and structure. Venous: The inferior vena cava is normal in size with greater than 50% respiratory variability, suggesting right atrial pressure of  3 mmHg. IAS/Shunts: No atrial level shunt detected by color flow Doppler. Additional Comments: 3D was performed not requiring image post processing on an independent workstation and was indeterminate.  LEFT VENTRICLE PLAX 2D LVIDd:         4.40 cm      Diastology LVIDs:         2.90 cm      LV e' medial:    8.38 cm/s LV PW:         1.20 cm  LV E/e' medial:  12.6 LV IVS:        1.10 cm      LV e' lateral:   8.49 cm/s LVOT diam:     1.80 cm      LV E/e' lateral: 12.5 LV SV:         84 LV SV Index:   47 LVOT Area:     2.54 cm LV IVRT:       95 msec  LV Volumes (MOD) LV vol d, MOD A2C: 105.0 ml LV vol d, MOD A4C: 112.0 ml LV vol s, MOD A2C: 33.9 ml LV vol s, MOD A4C: 50.8 ml LV SV MOD A2C:     71.1 ml LV SV MOD A4C:     112.0 ml LV SV MOD BP:      68.2 ml RIGHT VENTRICLE             IVC RV S prime:     10.70 cm/s  IVC diam: 1.50 cm TAPSE (M-mode): 2.4 cm LEFT ATRIUM             Index        RIGHT ATRIUM           Index LA diam:        4.00 cm 2.24 cm/m   RA Area:     19.90 cm LA Vol (A2C):   60.2 ml 33.66 ml/m  RA Volume:   67.30 ml  37.63 ml/m LA Vol (A4C):   57.6 ml 32.20 ml/m LA Biplane Vol: 62.0 ml 34.66 ml/m  AORTIC VALVE LVOT Vmax:   143.00 cm/s LVOT Vmean:  94.500 cm/s LVOT VTI:    0.329 m  AORTA Ao Root diam: 2.50 cm Ao Asc diam:  2.70 cm MITRAL VALVE                TRICUSPID VALVE MV Area (PHT): 2.93 cm     TR Peak grad:   20.2 mmHg MV E velocity: 106.00 cm/s  TR Vmax:        225.00 cm/s MV A velocity: 110.00 cm/s MV E/A ratio:  0.96         SHUNTS                             Systemic VTI:  0.33 m                             Systemic Diam: 1.80 cm Maude Emmer MD Electronically signed by Maude Emmer MD Signature Date/Time: 09/12/2024/11:20:27 AM    Final    CT CHEST W CONTRAST Result Date: 09/12/2024 EXAM: CT CHEST WITH CONTRAST 09/12/2024 01:04:26 AM TECHNIQUE: CT of the chest was performed with the administration of 75 mL of iohexol  (OMNIPAQUE ) 300 MG/ML solution. Multiplanar reformatted images  are provided for review. Automated exposure control, iterative reconstruction, and/or weight based adjustment of the mA/kV was utilized to reduce the radiation dose to as low as reasonably achievable. COMPARISON: None available. CLINICAL HISTORY: Chest pain, nonspecific. FINDINGS: MEDIASTINUM: Heart: Mild coronary artery calcification. Global cardiac size within normal limits. Trace pericardial effusion. The central airways are clear. Central pulmonary arteries are of normal caliber. Mild atherosclerotic calcification within the thoracic aorta. No aortic aneurysm. The esophagus is unremarkable. Multinodular thyroid  goiter noted with asymmetric enlargement of the left thyroid  gland. Recommended dedicated thyroid  sonography for further evaluation. LYMPH NODES: No pathologic thoracic  adenopathy. LUNGS AND PLEURA: Bibasilar parenchymal scarring. No confluent pulmonary infiltrate. No pneumothorax or pleural effusion. SOFT TISSUES/BONES: Osseous structures are age-appropriate. No acute bone abnormality. No lytic or blastic bone lesion. No acute abnormality of the soft tissues. UPPER ABDOMEN: Limited images of the upper abdomen demonstrates moderate hepatic steatosis. Status post cholecystectomy. Small hiatal hernia. IMPRESSION: 1. No acute findings. 2. Mild coronary artery calcification. 3. Multinodular thyroid  goiter. If indicated, this appeared to be better assessed with dedicated thyroid  sonography. 4. Small hiatal hernia. Electronically signed by: Dorethia Molt MD 09/12/2024 01:16 AM EST RP Workstation: [id number]   DG Chest 2 View Result Date: 09/11/2024 CLINICAL DATA:  Chest pain EXAM: CHEST - 2 VIEW COMPARISON:  08/10/2024 chest x-ray and 05/15/2024 chest CT FINDINGS: The cardiac silhouette, mediastinal and hilar contours are within normal limits and stable. Stable tortuosity and calcification of the thoracic aorta. Stable left thyroid  goiter with deviation of the trachea rightward. Low lung volumes with  vascular crowding and streaky bibasilar atelectasis. Subtle Kerley B-lines suggest interstitial edema. No pleural effusions or focal infiltrates. IMPRESSION: 1. Low lung volumes with vascular crowding and streaky bibasilar atelectasis. 2. Suspect mild interstitial edema. Electronically Signed   By: MYRTIS Stammer M.D.   On: 09/11/2024 19:00        Scheduled Meds:  amLODipine   10 mg Oral QHS   cyanocobalamin   1,000 mcg Oral Daily   enoxaparin  (LOVENOX ) injection  80 mg Subcutaneous BID   folic acid   1 mg Oral Daily   furosemide   20 mg Oral QODAY   furosemide   40 mg Oral QODAY   Influenza vac split trivalent PF  0.5 mL Intramuscular Tomorrow-1000   insulin  aspart  0-5 Units Subcutaneous QHS   insulin  aspart  0-9 Units Subcutaneous TID WC   irbesartan   300 mg Oral Daily   pantoprazole   40 mg Oral Daily   pravastatin   20 mg Oral QPM   spironolactone   25 mg Oral Daily   Continuous Infusions:  cefTRIAXone  (ROCEPHIN )  IV 2 g (09/12/24 2337)   doxycycline  (VIBRAMYCIN ) IV 100 mg (09/13/24 0028)     LOS: 2 days    Time spent: 35 Minutes    Edu On A Collier Monica, MD [facility]   If 7PM-7AM, please contact night-coverage [email or web address]  09/13/2024, 8:35 AM

## 2024-09-14 ENCOUNTER — Other Ambulatory Visit (HOSPITAL_COMMUNITY): Payer: Self-pay

## 2024-09-14 DIAGNOSIS — R079 Chest pain, unspecified: Secondary | ICD-10-CM | POA: Diagnosis not present

## 2024-09-14 LAB — CBC
HCT: 31.8 % — ABNORMAL LOW (ref 36.0–46.0)
Hemoglobin: 10.5 g/dL — ABNORMAL LOW (ref 12.0–15.0)
MCH: 30.2 pg (ref 26.0–34.0)
MCHC: 33 g/dL (ref 30.0–36.0)
MCV: 91.4 fL (ref 80.0–100.0)
Platelets: 325 K/uL (ref 150–400)
RBC: 3.48 MIL/uL — ABNORMAL LOW (ref 3.87–5.11)
RDW: 17 % — ABNORMAL HIGH (ref 11.5–15.5)
WBC: 13.5 K/uL — ABNORMAL HIGH (ref 4.0–10.5)
nRBC: 0 % (ref 0.0–0.2)

## 2024-09-14 LAB — GLUCOSE, CAPILLARY
Glucose-Capillary: 143 mg/dL — ABNORMAL HIGH (ref 70–99)
Glucose-Capillary: 174 mg/dL — ABNORMAL HIGH (ref 70–99)

## 2024-09-14 LAB — MAGNESIUM: Magnesium: 1.9 mg/dL (ref 1.7–2.4)

## 2024-09-14 LAB — VITAMIN D 25 HYDROXY (VIT D DEFICIENCY, FRACTURES): Vit D, 25-Hydroxy: 18.59 ng/mL — ABNORMAL LOW (ref 30–100)

## 2024-09-14 LAB — VITAMIN B12: Vitamin B-12: 1241 pg/mL — ABNORMAL HIGH (ref 180–914)

## 2024-09-14 MED ORDER — VITAMIN D (ERGOCALCIFEROL) 1.25 MG (50000 UNIT) PO CAPS
50000.0000 [IU] | ORAL_CAPSULE | ORAL | 0 refills | Status: AC
  Filled 2024-09-14: qty 5, 35d supply, fill #0

## 2024-09-14 MED ORDER — TRAMADOL HCL 50 MG PO TABS
25.0000 mg | ORAL_TABLET | Freq: Four times a day (QID) | ORAL | 0 refills | Status: AC | PRN
  Filled 2024-09-14: qty 30, 15d supply, fill #0

## 2024-09-14 MED ORDER — DOXYCYCLINE HYCLATE 100 MG PO TABS
100.0000 mg | ORAL_TABLET | Freq: Two times a day (BID) | ORAL | 0 refills | Status: AC
  Filled 2024-09-14: qty 6, 3d supply, fill #0

## 2024-09-14 MED ORDER — LIDOCAINE 5 % EX PTCH
1.0000 | MEDICATED_PATCH | CUTANEOUS | 0 refills | Status: AC
  Filled 2024-09-14: qty 30, 30d supply, fill #0

## 2024-09-14 MED ORDER — HYDRALAZINE HCL 25 MG PO TABS
25.0000 mg | ORAL_TABLET | Freq: Two times a day (BID) | ORAL | 0 refills | Status: AC
  Filled 2024-09-14: qty 60, 30d supply, fill #0

## 2024-09-14 MED ORDER — VITAMIN D (ERGOCALCIFEROL) 1.25 MG (50000 UNIT) PO CAPS
50000.0000 [IU] | ORAL_CAPSULE | ORAL | Status: DC
  Administered 2024-09-14: 50000 [IU] via ORAL
  Filled 2024-09-14: qty 1

## 2024-09-14 MED ORDER — CEFDINIR 300 MG PO CAPS
300.0000 mg | ORAL_CAPSULE | Freq: Two times a day (BID) | ORAL | 0 refills | Status: AC
  Filled 2024-09-14: qty 6, 3d supply, fill #0

## 2024-09-14 MED ORDER — DOXYCYCLINE HYCLATE 100 MG PO TABS
100.0000 mg | ORAL_TABLET | Freq: Two times a day (BID) | ORAL | Status: DC
  Administered 2024-09-14: 100 mg via ORAL
  Filled 2024-09-14: qty 1

## 2024-09-14 NOTE — Discharge Summary (Signed)
 Physician Discharge Summary   Patient: Bethany Ray MRN: [id number] DOB: 1946/01/12  Admit date:     09/11/2024  Discharge date: 09/14/24  Discharge Physician: Owen DELENA Lore   PCP: Sheryle Carwin, MD   Recommendations at discharge:    Needs follow up with cardiology for further care  Needs further adjustment BP medication.  Follow up resolution of PNA> and pleuritic, costochondritis chest pain  Needs Repeat Vitamin D  level.   Discharge Diagnoses: Principal Problem:   Acute chest pain Active Problems:   QT prolongation   Type 2 diabetes mellitus (HCC)   Essential hypertension   Pleurisy  Resolved Problems:   * No resolved hospital problems. [facility] Course: 78 year old with past medical history significant for hypertension, chronic diastolic congestive heart failure, hyperlipidemia, diabetes type 2, breast cancer, rheumatoid arthritis who presented to [facility] by direction of [place] urgent care 09/11/2024 patient declined ambulance transport complaining of chest pain, lower extremity edema and dyspnea. Symptoms ongoing for the last 4 days. Chest pain radiates to the left arm worse with deep inspiration. Patient was found to have troponin at 21 and flat, CT chest without contrast no acute finding, mild coronary artery calcification, multinodular thyroid  goiter. There was some concern for pneumonia due to leukocytosis   Assessment and Plan: 1-Chest pain suspect musculoskeletal pain - Patient presented with chest pain on the left side associated with dyspnea radiated to the left upper extremity worse with deep inspiration. - EKG normal sinus rhythm - Chest x-ray low lung volume, vascular crowding and streaky bibasilar atelectasis, suspect mild interstitial edema.  D-dimer elevated.  CT chest with contrast no acute finding mild coronary artery calcification, multinodular thyroid  goiter. -TTE: Left ventricular ejection fraction 55 to 60%, left ventricular with no wall motion  abnormality,LV diastolic parameters were normal, biatrial enlargement, trivial MR, IVC normal in size, no aortic stenosis.  - CT angio chest negative for PE, atelectasis at the bases of the lower Chest pain could be related to costochondritis, muscular skeletal Will schedule Tylenol , lidocaine  patch.  As needed tramadol      Hypertensive urgency History of hypertension Chronic diastolic heart failure - Continue amlodipine , Lasix , Avapro , spironolactone    Pneumonia Leukocytosis - Patient with leukocytosis, bilateral lower lobe atelectasis versus infiltrate. Due to pleuritic chest pain component will treat for pneumonia continue ceftriaxone  and doxycycline  Discharge on Cefdinir  and doxy to complete 5 days treatment.   Multinodular thyroid  goiter - Needs outpatient thyroid  ultrasound   Hyperlipidemia - Continue with Pravachol    Diabetes type 2 - Continue with sliding scale insulin   Resume Metformin .     Rheumatoid arthritis - Continue follow-up with rheumatology   GERD - Continue PPI   Sensitivity to touch;  TSH normal 1.3  Vitamin D , B 12 Vitamin D  deficiency; started supplement.   Magnesium  1.7: Replaced.   Estimated body mass index is 35.52 kg/m as calculated from the following:   Height as of this encounter: 5' (1.524 m).   Weight as of this encounter: 82.5 kg.          Consultants: Cardiology  Procedures performed: ECHO Disposition: Home Diet recommendation:  Discharge Diet Orders (From admission, onward)     Start     Ordered   09/14/24 0000  Diet - low sodium heart healthy        09/14/24 1210           Cardiac diet DISCHARGE MEDICATION: Allergies as of 09/14/2024       Reactions   Penicillins Nausea  And Vomiting   Childhood allergy Other Reaction(s): Not available Product containing penicillin and antibiotic (product)        Medication List     STOP taking these medications    cetirizine  10 MG tablet Commonly known as: ZyrTEC   Allergy       TAKE these medications    acetaminophen  500 MG tablet Commonly known as: TYLENOL  Take 500 mg by mouth every 8 (eight) hours as needed for moderate pain.   amLODipine  10 MG tablet Commonly known as: NORVASC  Take 10 mg by mouth daily.   aspirin  EC 81 MG tablet Take 81 mg by mouth daily.   cefdinir  300 MG capsule Commonly known as: OMNICEF  Take 1 capsule (300 mg total) by mouth 2 (two) times daily for 3 days.   cyanocobalamin  1000 MCG tablet Take 1 tablet (1,000 mcg total) by mouth daily.   diclofenac  Sodium 1 % Gel Commonly known as: VOLTAREN  Apply 2 g topically 4 (four) times daily. What changed:  when to take this reasons to take this   doxycycline  100 MG tablet Commonly known as: VIBRA -TABS Take 1 tablet (100 mg total) by mouth every 12 (twelve) hours for 3 days.   fluticasone  50 MCG/ACT nasal spray Commonly known as: FLONASE  Place 2 sprays into both nostrils daily. What changed:  when to take this reasons to take this   folic acid  1 MG tablet Commonly known as: FOLVITE  Take 1 mg by mouth daily.   furosemide  40 MG tablet Commonly known as: Lasix  Take 40 mg Daily  alternating with 20 mg Daily   glimepiride  1 MG tablet Commonly known as: AMARYL  Take 1 mg by mouth daily with breakfast.   hydrALAZINE  25 MG tablet Commonly known as: APRESOLINE  Take 1 tablet (25 mg total) by mouth 2 (two) times daily.   lidocaine  5 % Commonly known as: LIDODERM  Place 1 patch onto the skin daily. Remove & Discard patch within 12 hours or as directed by MD What changed:  when to take this reasons to take this   loperamide 2 MG tablet Commonly known as: IMODIUM A-D Take 2-4 mg by mouth 4 (four) times daily as needed for diarrhea or loose stools.   metFORMIN  500 MG 24 hr tablet Commonly known as: GLUCOPHAGE -XR Take 500 mg by mouth 2 (two) times daily.   methotrexate 2.5 MG tablet Commonly known as: RHEUMATREX Take 7.5 mg by mouth 2 (two) times a week.  Thursdays and Fridays   olmesartan  40 MG tablet Commonly known as: BENICAR  Take 1 tablet (40 mg total) by mouth daily.   pantoprazole  40 MG tablet Commonly known as: PROTONIX  Take 1 tablet (40 mg total) by mouth daily.   pravastatin  20 MG tablet Commonly known as: PRAVACHOL  Take 20 mg by mouth every evening.   spironolactone  25 MG tablet Commonly known as: ALDACTONE  Take 25 mg by mouth daily.   traMADol  50 MG tablet Commonly known as: ULTRAM  Take 0.5 tablets (25 mg total) by mouth every 6 (six) hours as needed for severe pain (pain score 7-10).   Turmeric Curcumin 500 MG Caps Take 500 mg by mouth daily.   valACYclovir 1000 MG tablet Commonly known as: VALTREX Take 1,000 mg by mouth 3 (three) times daily.   Vitamin D  (Ergocalciferol ) 1.25 MG (50000 UNIT) Caps capsule Commonly known as: DRISDOL  Take 1 capsule (50,000 Units total) by mouth every 7 (seven) days. Start taking on: September 21, 2024        Follow-up Information  Sheryle Carwin, MD Follow up in 1 week(s).   Specialty: Internal Medicine Contact information: [address] [place] [address] [phone]                Discharge Exam: Bethany Ray   09/12/24 9371 09/13/24 0417 09/14/24 0500  Weight: 82.1 kg 82.5 kg 82.7 kg   General; NAD Condition at discharge: stable  The results of significant diagnostics from this hospitalization (including imaging, microbiology, ancillary and laboratory) are listed below for reference.   Imaging Studies: CT Angio Chest Pulmonary Embolism (PE) W or WO Contrast Result Date: 09/13/2024 EXAM: CTA of the Chest with contrast for PE 09/13/2024 04:12:52 AM TECHNIQUE: CTA of the chest was performed after the administration of intravenous contrast. Multiplanar reformatted images are provided for review. MIP images are provided for review. Automated exposure control, iterative reconstruction, and/or weight based adjustment of the mA/kV was utilized to  reduce the radiation dose to as low as reasonably achievable. COMPARISON: CT of the chest dated 09/12/2024. CLINICAL HISTORY: Pulmonary embolism (PE) suspected, high prob; Elevated D-dimer. FINDINGS: PULMONARY ARTERIES: Pulmonary arteries are adequately opacified for evaluation. No pulmonary embolism. Main pulmonary artery is normal in caliber. MEDIASTINUM: The heart is enlarged and there is a small pericardial effusion. There is mild calcific atheromatous disease within the thoracic aorta. There is intrathoracic goiter again demonstrated on the left with displacement of the trachea laterally to the right. LYMPH NODES: No mediastinal, hilar or axillary lymphadenopathy. LUNGS AND PLEURA: There are hazy and streaky opacities present independently within the lower lobes bilaterally. No pleural effusion or pneumothorax. UPPER ABDOMEN: There is a small sliding hiatus hernia. SOFT TISSUES AND BONES: No acute bone or soft tissue abnormality. IMPRESSION: 1. No evidence of pulmonary embolus. 2. Hazy and streaky opacities within the lower lobes bilaterally. 3. Enlarged heart with small pericardial effusion. 4. 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 5. Small sliding hiatus hernia. Electronically signed by: Evalene Coho MD 09/13/2024 04:41 AM EST RP Workstation: [id number]   VAS US  LOWER EXTREMITY VENOUS (DVT) Result Date: 09/12/2024  Lower Venous DVT Study Patient Name:  Bethany Ray  Date of Exam:   09/12/2024 Medical Rec #: [id number]        Accession #:    [id number] Date of Birth: [birth year]         Patient Gender: F Patient Age:   76 years Exam Location:  [facility] Procedure:      VAS US  LOWER EXTREMITY VENOUS (DVT) Referring Phys: ERIC AUSTRIA --------------------------------------------------------------------------------  Indications: Edema, and + D-dimer, chest discomfort.  Comparison  Study: Previous study of the left lower extremity on 7.25.2021 Performing Technologist: Edilia Elden Appl  Examination Guidelines: A complete evaluation includes B-mode imaging, spectral Doppler, color Doppler, and power Doppler as needed of all accessible portions of each vessel. Bilateral testing is considered an integral part of a complete examination. Limited examinations for reoccurring indications may be performed as noted. The reflux portion of the exam is performed with the patient in reverse Trendelenburg.  +---------+---------------+---------+-----------+----------+--------------+ RIGHT    CompressibilityPhasicitySpontaneityPropertiesThrombus Aging +---------+---------------+---------+-----------+----------+--------------+ CFV      Full           Yes      Yes                                 +---------+---------------+---------+-----------+----------+--------------+ SFJ  Full           Yes      Yes                                 +---------+---------------+---------+-----------+----------+--------------+ FV Prox  Full                                                        +---------+---------------+---------+-----------+----------+--------------+ FV Mid   Full                                                        +---------+---------------+---------+-----------+----------+--------------+ FV DistalFull                                                        +---------+---------------+---------+-----------+----------+--------------+ PFV      Full                                                        +---------+---------------+---------+-----------+----------+--------------+ POP      Full           Yes      Yes                                 +---------+---------------+---------+-----------+----------+--------------+ PTV      Full                                                         +---------+---------------+---------+-----------+----------+--------------+ PERO     Full                                                        +---------+---------------+---------+-----------+----------+--------------+   +---------+---------------+---------+-----------+----------+----------------+ LEFT     CompressibilityPhasicitySpontaneityPropertiesThrombus Aging   +---------+---------------+---------+-----------+----------+----------------+ CFV      Full           Yes      Yes                                   +---------+---------------+---------+-----------+----------+----------------+ SFJ      Full           Yes      Yes                                   +---------+---------------+---------+-----------+----------+----------------+  FV Prox  Full                                                          +---------+---------------+---------+-----------+----------+----------------+ FV Mid   Full                                                          +---------+---------------+---------+-----------+----------+----------------+ FV DistalFull                                                          +---------+---------------+---------+-----------+----------+----------------+ PFV      Full                                                          +---------+---------------+---------+-----------+----------+----------------+ POP      Full           Yes      Yes                                   +---------+---------------+---------+-----------+----------+----------------+ PTV      Full                                                          +---------+---------------+---------+-----------+----------+----------------+ PERO                                                  Patent by color. +---------+---------------+---------+-----------+----------+----------------+     Summary: RIGHT: - There is no evidence of deep vein thrombosis in the lower  extremity.  - No cystic structure found in the popliteal fossa.  LEFT: - There is no evidence of deep vein thrombosis in the lower extremity. However, portions of this examination were limited- see technologist comments above.  - No cystic structure found in the popliteal fossa.  *See table(s) above for measurements and observations. Electronically signed by Debby Robertson on 09/12/2024 at 8:13:42 PM.    Final    ECHOCARDIOGRAM COMPLETE Result Date: 09/12/2024    ECHOCARDIOGRAM REPORT   Patient Name:   Bethany Ray Date of Exam: 09/12/2024 Medical Rec #:  [id number]       Height:       60.0 in Accession #:    [id number]      Weight:       180.9 lb Date of Birth:  03/19/1946        BSA:  1.789 m Patient Age:    19 years        BP:           167/77 mmHg Patient Gender: F               HR:           55 bpm. Exam Location:  Inpatient Procedure: 2D Echo, Color Doppler, Cardiac Doppler and Intracardiac            Opacification Agent (Both Spectral and Color Flow Doppler were            utilized during procedure). Indications:    Chest Pain R07.9  History:        Patient has prior history of Echocardiogram examinations, most                 recent 05/16/2024. Risk Factors:Diabetes and Hypertension.  Sonographer:    Tinnie Gosling RDCS Referring Phys: [id number] BRADLY POUR STEPHENS IMPRESSIONS  1. Left ventricular ejection fraction, by estimation, is 55 to 60%. The left ventricle has normal function. The left ventricle has no regional wall motion abnormalities. Left ventricular diastolic parameters were normal.  2. Right ventricular systolic function is normal. The right ventricular size is normal.  3. Left atrial size was mildly dilated.  4. Right atrial size was mildly dilated.  5. The mitral valve is abnormal. Trivial mitral valve regurgitation. No evidence of mitral stenosis. Moderate mitral annular calcification.  6. The aortic valve is tricuspid. There is mild calcification of the aortic valve. Aortic valve  regurgitation is not visualized. Aortic valve sclerosis is present, with no evidence of aortic valve stenosis.  7. The inferior vena cava is normal in size with greater than 50% respiratory variability, suggesting right atrial pressure of 3 mmHg. FINDINGS  Left Ventricle: Left ventricular ejection fraction, by estimation, is 55 to 60%. The left ventricle has normal function. The left ventricle has no regional wall motion abnormalities. Definity  contrast agent was given IV to delineate the left ventricular  endocardial borders. Strain was performed and the global longitudinal strain is indeterminate. The left ventricular internal cavity size was normal in size. There is no left ventricular hypertrophy. Left ventricular diastolic parameters were normal. Right Ventricle: The right ventricular size is normal. No increase in right ventricular wall thickness. Right ventricular systolic function is normal. Left Atrium: Left atrial size was mildly dilated. Right Atrium: Right atrial size was mildly dilated. Pericardium: There is no evidence of pericardial effusion. Mitral Valve: The mitral valve is abnormal. There is mild thickening of the mitral valve leaflet(s). There is mild calcification of the mitral valve leaflet(s). Moderate mitral annular calcification. Trivial mitral valve regurgitation. No evidence of mitral valve stenosis. Tricuspid Valve: The tricuspid valve is normal in structure. Tricuspid valve regurgitation is mild . No evidence of tricuspid stenosis. Aortic Valve: The aortic valve is tricuspid. There is mild calcification of the aortic valve. Aortic valve regurgitation is not visualized. Aortic valve sclerosis is present, with no evidence of aortic valve stenosis. Pulmonic Valve: The pulmonic valve was normal in structure. Pulmonic valve regurgitation is trivial. No evidence of pulmonic stenosis. Aorta: The aortic root is normal in size and structure. Venous: The inferior vena cava is normal in size with  greater than 50% respiratory variability, suggesting right atrial pressure of 3 mmHg. IAS/Shunts: No atrial level shunt detected by color flow Doppler. Additional Comments: 3D was performed not requiring image post processing on an independent workstation and was indeterminate.  LEFT  VENTRICLE PLAX 2D LVIDd:         4.40 cm      Diastology LVIDs:         2.90 cm      LV e' medial:    8.38 cm/s LV PW:         1.20 cm      LV E/e' medial:  12.6 LV IVS:        1.10 cm      LV e' lateral:   8.49 cm/s LVOT diam:     1.80 cm      LV E/e' lateral: 12.5 LV SV:         84 LV SV Index:   47 LVOT Area:     2.54 cm LV IVRT:       95 msec  LV Volumes (MOD) LV vol d, MOD A2C: 105.0 ml LV vol d, MOD A4C: 112.0 ml LV vol s, MOD A2C: 33.9 ml LV vol s, MOD A4C: 50.8 ml LV SV MOD A2C:     71.1 ml LV SV MOD A4C:     112.0 ml LV SV MOD BP:      68.2 ml RIGHT VENTRICLE             IVC RV S prime:     10.70 cm/s  IVC diam: 1.50 cm TAPSE (M-mode): 2.4 cm LEFT ATRIUM             Index        RIGHT ATRIUM           Index LA diam:        4.00 cm 2.24 cm/m   RA Area:     19.90 cm LA Vol (A2C):   60.2 ml 33.66 ml/m  RA Volume:   67.30 ml  37.63 ml/m LA Vol (A4C):   57.6 ml 32.20 ml/m LA Biplane Vol: 62.0 ml 34.66 ml/m  AORTIC VALVE LVOT Vmax:   143.00 cm/s LVOT Vmean:  94.500 cm/s LVOT VTI:    0.329 m  AORTA Ao Root diam: 2.50 cm Ao Asc diam:  2.70 cm MITRAL VALVE                TRICUSPID VALVE MV Area (PHT): 2.93 cm     TR Peak grad:   20.2 mmHg MV E velocity: 106.00 cm/s  TR Vmax:        225.00 cm/s MV A velocity: 110.00 cm/s MV E/A ratio:  0.96         SHUNTS                             Systemic VTI:  0.33 m                             Systemic Diam: 1.80 cm Maude Emmer MD Electronically signed by Maude Emmer MD Signature Date/Time: 09/12/2024/11:20:27 AM    Final    CT CHEST W CONTRAST Result Date: 09/12/2024 EXAM: CT CHEST WITH CONTRAST 09/12/2024 01:04:26 AM TECHNIQUE: CT of the chest was performed with the administration of  75 mL of iohexol  (OMNIPAQUE ) 300 MG/ML solution. Multiplanar reformatted images are provided for review. Automated exposure control, iterative reconstruction, and/or weight based adjustment of the mA/kV was utilized to reduce the radiation dose to as low as reasonably achievable. COMPARISON: None available. CLINICAL HISTORY: Chest pain, nonspecific. FINDINGS: MEDIASTINUM: Heart: Mild coronary  artery calcification. Global cardiac size within normal limits. Trace pericardial effusion. The central airways are clear. Central pulmonary arteries are of normal caliber. Mild atherosclerotic calcification within the thoracic aorta. No aortic aneurysm. The esophagus is unremarkable. Multinodular thyroid  goiter noted with asymmetric enlargement of the left thyroid  gland. Recommended dedicated thyroid  sonography for further evaluation. LYMPH NODES: No pathologic thoracic adenopathy. LUNGS AND PLEURA: Bibasilar parenchymal scarring. No confluent pulmonary infiltrate. No pneumothorax or pleural effusion. SOFT TISSUES/BONES: Osseous structures are age-appropriate. No acute bone abnormality. No lytic or blastic bone lesion. No acute abnormality of the soft tissues. UPPER ABDOMEN: Limited images of the upper abdomen demonstrates moderate hepatic steatosis. Status post cholecystectomy. Small hiatal hernia. IMPRESSION: 1. No acute findings. 2. Mild coronary artery calcification. 3. Multinodular thyroid  goiter. If indicated, this appeared to be better assessed with dedicated thyroid  sonography. 4. Small hiatal hernia. Electronically signed by: Dorethia Molt MD 09/12/2024 01:16 AM EST RP Workstation: [id number]   DG Chest 2 View Result Date: 09/11/2024 CLINICAL DATA:  Chest pain EXAM: CHEST - 2 VIEW COMPARISON:  08/10/2024 chest x-ray and 05/15/2024 chest CT FINDINGS: The cardiac silhouette, mediastinal and hilar contours are within normal limits and stable. Stable tortuosity and calcification of the thoracic aorta. Stable left  thyroid  goiter with deviation of the trachea rightward. Low lung volumes with vascular crowding and streaky bibasilar atelectasis. Subtle Kerley B-lines suggest interstitial edema. No pleural effusions or focal infiltrates. IMPRESSION: 1. Low lung volumes with vascular crowding and streaky bibasilar atelectasis. 2. Suspect mild interstitial edema. Electronically Signed   By: MYRTIS Stammer M.D.   On: 09/11/2024 19:00    Microbiology: Results for orders placed or performed during the hospital encounter of 09/11/24  Respiratory (~20 pathogens) panel by PCR     Status: None   Collection Time: 09/12/24  4:36 AM   Specimen: Nasopharyngeal Swab; Respiratory  Result Value Ref Range Status   Adenovirus NOT DETECTED NOT DETECTED Final   Coronavirus 229E NOT DETECTED NOT DETECTED Final    Comment: (NOTE) The Coronavirus on the Respiratory Panel, DOES NOT test for the novel  Coronavirus (2019 nCoV)    Coronavirus HKU1 NOT DETECTED NOT DETECTED Final   Coronavirus NL63 NOT DETECTED NOT DETECTED Final   Coronavirus OC43 NOT DETECTED NOT DETECTED Final   Metapneumovirus NOT DETECTED NOT DETECTED Final   Rhinovirus / Enterovirus NOT DETECTED NOT DETECTED Final   Influenza A NOT DETECTED NOT DETECTED Final   Influenza B NOT DETECTED NOT DETECTED Final   Parainfluenza Virus 1 NOT DETECTED NOT DETECTED Final   Parainfluenza Virus 2 NOT DETECTED NOT DETECTED Final   Parainfluenza Virus 3 NOT DETECTED NOT DETECTED Final   Parainfluenza Virus 4 NOT DETECTED NOT DETECTED Final   Respiratory Syncytial Virus NOT DETECTED NOT DETECTED Final   Bordetella pertussis NOT DETECTED NOT DETECTED Final   Bordetella Parapertussis NOT DETECTED NOT DETECTED Final   Chlamydophila pneumoniae NOT DETECTED NOT DETECTED Final   Mycoplasma pneumoniae NOT DETECTED NOT DETECTED Final    Comment: Performed at [facility], [address], [place], [place] [address]    Labs: CBC: Recent Labs  Lab 09/11/24 1931  09/12/24 0557 09/13/24 0439 09/14/24 0754  WBC 17.0* 16.0* 15.1* 13.5*  HGB 12.5 11.2* 11.2* 10.5*  HCT 37.7 33.5* 34.7* 31.8*  MCV 92.9 92.3 94.0 91.4  PLT 354 315 305 325   Basic Metabolic Panel: Recent Labs  Lab 09/11/24 1931 09/12/24 0557 09/13/24 0439 09/14/24 0754  NA 141  --  136  --   K 3.5  --  4.1  --   CL 104  --  103  --   CO2 24  --  21*  --   GLUCOSE 105*  --  143*  --   BUN 8  --  10  --   CREATININE 0.74 0.75 0.77  --   CALCIUM 9.7  --  9.1  --   MG  --   --  1.7 1.9   Liver Function Tests: No results for input(s): AST, ALT, ALKPHOS, BILITOT, PROT, ALBUMIN in the last 168 hours. CBG: Recent Labs  Lab 09/13/24 1114 09/13/24 1650 09/13/24 2210 09/14/24 0804 09/14/24 1200  GLUCAP 165* 166* 166* 143* 174*    Discharge time spent: greater than 30 minutes.  Signed: Owen DELENA Lore, MD [facility] 09/14/2024

## 2024-09-14 NOTE — Progress Notes (Signed)
 Discharge medications delivered to patient at the bedside.

## 2024-09-14 NOTE — Progress Notes (Addendum)
 Progress Note  Patient Name: Bethany Ray Date of Encounter: 09/14/2024 [facility] Cardiologist: Alvan Carrier, MD   Interval Summary    Reports that she continues to have some chest pain that is worse when taking deep breaths.  Denies any worsening pain when laying flat.  The pain radiates to her left shoulder.  Denies that pain is worse with exertion.  Denies any lower extremity edema, nausea, vomiting, fever, chills, diaphoresis, or shortness of breath.  Reported that she did have a cold pack in October.  Vital Signs Vitals:   09/13/24 1322 09/13/24 2006 09/14/24 0437 09/14/24 0500  BP: (!) 170/72 (!) 154/58 (!) 155/70   Pulse: 77 75 72   Resp: 19 14 15    Temp: 97.8 F (36.6 C) 98.4 F (36.9 C) 98.6 F (37 C)   TempSrc: Oral Oral Oral   SpO2: 94% 96% 94%   Weight:    82.7 kg  Height:        Intake/Output Summary (Last 24 hours) at 09/14/2024 1019 Last data filed at 09/14/2024 0650 Gross per 24 hour  Intake 1310 ml  Output 400 ml  Net 910 ml      09/14/2024    5:00 AM 09/13/2024    4:17 AM 09/12/2024    6:28 AM  Last 3 Weights  Weight (lbs) 182 lb 6.4 oz 181 lb 14.4 oz 180 lb 14.4 oz  Weight (kg) 82.736 kg 82.509 kg 82.056 kg      Telemetry/ECG  Normal sinus rhythm with heart rates in the 60s to 70s- Personally Reviewed  Physical Exam  GEN: No acute distress.  Alert and orientated on room air. Neck: No JVD Cardiac: RRR, no murmurs, rubs, or gallops.  2 out of 6 systolic murmur heard best on upper sternal border. Respiratory: Clear to auscultation bilaterally. GI: Soft, nontender, non-distended  MS: No edema  Assessment & Plan  Bethany Ray is a 78 year old female with a past medical history of chest pain, hypertension, hyperlipidemia, rheumatoid arthritis, and chronic HFpEF  Pleuritic chest pain Patient has had a history of chest pain in the past. Had prior coronary CTA on 11/2019 that showed a coronary calcium score of 0. 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 High sensitivity troponins 21 > 20 >20.  Pro BNP within normal limits.   Creatinine 0.77. Chest CT on 09/12/2024 found mild coronary artery calcifications Echocardiogram on 09/12/24 showed normal LVEF of 55 to 60% no RWMA, normal RV systolic function, mildly dilated left and right atrium, moderate MAC with trivial regurgitation and no stenosis, mild aortic valve calcification with no stenosis, no evidence of pericardial effusion, and IVC with greater than 50% respiratory variability.   Patient's chest pain sounds atypical for ACS. Has outpatient follow-up with Dr. Alvan on 11/03/2024 Plan for outpatient cardiac CT.   Chronic diastolic heart failure   Denies any shortness of breath, lower extreme edema, orthopnea. Appears euvolemic on exam. proBNP within normal limits Continue Lasix  with alternating doses of 20 mg and 40 mg. GDMT Continue Aldactone  25 mg daily May consider starting Jardiance 10 mg daily.   Hypertension Prior to admission was on amlodipine , olmesartan , and Aldactone .  Pressures continue to remain elevated most recent BP was 155/70. Continue amlodipine  10 mg daily Continue irbesartan  300 mg daily Continue Aldactone  25 mg daily Recommend checking blood pressures  daily and keeping a log.   Hyperlipidemia Continue pravastatin  20 mg daily   Otherwise management per primary   [facility] will sign off.   The patient is ready for discharge today from a cardiac standpoint. Medication Recommendations: As above Other recommendations (labs, testing, etc): none Follow up as an outpatient: Has outpatient follow-up with Dr. Alvan on 11/03/2024 For questions or updates, please contact [facility] Please consult [email or web address] for contact  info under        Signed, Diem Pagnotta, PA-C

## 2024-09-14 NOTE — Plan of Care (Signed)
  Problem: Clinical Measurements: Goal: Will remain free from infection Outcome: Progressing   Problem: Clinical Measurements: Goal: Diagnostic test results will improve Outcome: Progressing   Problem: Clinical Measurements: Goal: Respiratory complications will improve Outcome: Progressing   Problem: Clinical Measurements: Goal: Cardiovascular complication will be avoided Outcome: Progressing   Problem: Pain Managment: Goal: General experience of comfort will improve and/or be controlled Outcome: Progressing

## 2024-09-14 NOTE — Evaluation (Signed)
 Occupational Therapy Evaluation/Discharge Patient Details Name: Bethany Ray MRN: [id number] DOB: 1946-04-22 Today's Date: 09/14/2024   History of Present Illness   78 yo F adm with CP 12/5 and hypertensive urgency CT chest multnodular thryoid goiter  PMH acute pulmonary edema, DM2, HTN, CP, OA , Breast CA,     Clinical Impressions PTA, pt lives with family, typically Independent with ADLs, IADLs and mobility without AD though does require assist for compression stocking mgmt. Pt presents now at baseline for ADLs/mobility. Pt able move about room to gather items, manage toileting, bathing and dressings tasks without issues. Pt reports ongoing chest discomfort when taking a deep breath but does not interfere with mgmt of daily tasks. No further skilled acute therapy services needed at this time. No postacute OT needs either.      If plan is discharge home, recommend the following:   Assistance with cooking/housework;Other (comment) (assist with compression stockings)     Functional Status Assessment   Patient has not had a recent decline in their functional status     Equipment Recommendations   None recommended by OT     Recommendations for Other Services         Precautions/Restrictions   Precautions Precautions: None Restrictions Weight Bearing Restrictions Per Provider Order: No     Mobility Bed Mobility Overal bed mobility: Modified Independent                  Transfers Overall transfer level: Modified independent Equipment used: None                      Balance Overall balance assessment: No apparent balance deficits (not formally assessed)                                         ADL either performed or assessed with clinical judgement   ADL Overall ADL's : Needs assistance/impaired Eating/Feeding: Independent   Grooming: Modified independent;Standing   Upper Body Bathing: Modified independent   Lower  Body Bathing: Sitting/lateral leans;Sit to/from stand;Modified independent   Upper Body Dressing : Modified independent   Lower Body Dressing: Minimal assistance;Sitting/lateral leans;Sit to/from stand Lower Body Dressing Details (indicate cue type and reason): assist for socks, reports difficulty reaching down d/t chronic issues with LE Toilet Transfer: Ambulation;Modified Independent Toilet Transfer Details (indicate cue type and reason): no AD, slow pace Toileting- Clothing Manipulation and Hygiene: Sitting/lateral lean;Sit to/from stand;Modified independent               Vision Ability to See in Adequate Light: 0 Adequate Patient Visual Report: No change from baseline Vision Assessment?: No apparent visual deficits     Perception         Praxis         Pertinent Vitals/Pain       Extremity/Trunk Assessment Upper Extremity Assessment Upper Extremity Assessment: Overall WFL for tasks assessed;Right hand dominant   Lower Extremity Assessment Lower Extremity Assessment: Overall WFL for tasks assessed   Cervical / Trunk Assessment Cervical / Trunk Assessment: Normal   Communication Communication Communication: No apparent difficulties   Cognition Arousal: Alert Behavior During Therapy: WFL for tasks assessed/performed Cognition: No apparent impairments                               Following commands: Intact  Cueing  General Comments   Cueing Techniques: Verbal cues      Exercises     Shoulder Instructions      Home Living Family/patient expects to be discharged to:: Private residence Living Arrangements: Spouse/significant other;Children;Other relatives Available Help at Discharge: Family;Available 24 hours/day Type of Home: House Home Access: Stairs to enter [employer] of Steps: 3-4 Entrance Stairs-Rails: Right;Left Home Layout: One level               Home Equipment: [employer] (2 wheels);Cane -  single point          Prior Functioning/Environment Prior Level of Function : Independent/Modified Independent             Mobility Comments: no AD for mobility ADLs Comments: Indep with ADLs, IADLs typically but requiring some assist for compression stockings/socks recently    OT Problem List: Decreased activity tolerance;Pain   OT Treatment/Interventions:        OT Goals(Current goals can be found in the care plan section)   Acute Rehab OT Goals Patient Stated Goal: resolve pain issue, home soon OT Goal Formulation: All assessment and education complete, DC therapy   OT Frequency:       Co-evaluation              AM-PAC OT 6 Clicks Daily Activity     Outcome Measure Help from another person eating meals?: None Help from another person taking care of personal grooming?: None Help from another person toileting, which includes using toliet, bedpan, or urinal?: None Help from another person bathing (including washing, rinsing, drying)?: None Help from another person to put on and taking off regular upper body clothing?: None Help from another person to put on and taking off regular lower body clothing?: A Little 6 Click Score: 23   End of Session Equipment Utilized During Treatment: Gait belt  Activity Tolerance: Patient tolerated treatment well Patient left:    OT Visit Diagnosis: Other abnormalities of gait and mobility (R26.89)                Time: 0926-1000 OT Time Calculation (min): 34 min Charges:  OT General Charges $OT Visit: 1 Visit OT Evaluation $OT Eval Low Complexity: 1 Low OT Treatments $Self Care/Home Management : 8-22 mins  Mliss NOVAK, OTR/L Acute Rehab Services Office: [phone]   Mliss Fish 09/14/2024, 11:05 AM

## 2024-09-14 NOTE — Progress Notes (Signed)
 PT Cancellation Note  Patient Details Name: Bethany Ray MRN: [id number] DOB: [birth year]   Cancelled Treatment:    Reason Eval/Treat Not Completed: PT screened, no needs identified, will sign off Spoke with OT who evaluated pt and reports pt at baseline and no PT needs. Will sign off. Nurse reports pt to discharge shortly. Bethany Ray, PT Acute Rehab [facility] [phone]   Bethany Ray 09/14/2024, 11:47 AM

## 2024-09-14 NOTE — Discharge Instructions (Signed)
 You have new prescription for antibiotics to treat Pneumonia.  New medication to help control your Blood pressure; Hydralazine ./  Pain medication as needed.

## 2024-09-22 ENCOUNTER — Other Ambulatory Visit (HOSPITAL_COMMUNITY): Payer: Self-pay | Admitting: Internal Medicine

## 2024-09-22 DIAGNOSIS — E042 Nontoxic multinodular goiter: Secondary | ICD-10-CM

## 2024-09-24 ENCOUNTER — Ambulatory Visit (HOSPITAL_COMMUNITY)
Admission: RE | Admit: 2024-09-24 | Discharge: 2024-09-24 | Disposition: A | Source: Ambulatory Visit | Attending: Internal Medicine | Admitting: Internal Medicine

## 2024-09-24 DIAGNOSIS — E042 Nontoxic multinodular goiter: Secondary | ICD-10-CM | POA: Insufficient documentation

## 2024-10-05 ENCOUNTER — Other Ambulatory Visit (HOSPITAL_COMMUNITY): Payer: Self-pay | Admitting: Internal Medicine

## 2024-10-05 DIAGNOSIS — E042 Nontoxic multinodular goiter: Secondary | ICD-10-CM

## 2024-10-13 DIAGNOSIS — E042 Nontoxic multinodular goiter: Secondary | ICD-10-CM

## 2024-10-14 ENCOUNTER — Encounter (HOSPITAL_COMMUNITY): Payer: Self-pay

## 2024-10-14 ENCOUNTER — Ambulatory Visit (HOSPITAL_COMMUNITY)
Admission: RE | Admit: 2024-10-14 | Discharge: 2024-10-14 | Disposition: A | Discharge status: Home / Self Care | Source: Ambulatory Visit | Attending: Internal Medicine | Admitting: Internal Medicine

## 2024-10-14 DIAGNOSIS — E041 Nontoxic single thyroid nodule: Secondary | ICD-10-CM | POA: Diagnosis present

## 2024-10-14 DIAGNOSIS — E042 Nontoxic multinodular goiter: Secondary | ICD-10-CM

## 2024-10-14 MED ORDER — LIDOCAINE HCL (PF) 2 % IJ SOLN
10.0000 mL | Freq: Once | INTRAMUSCULAR | Status: AC
  Administered 2024-10-14: 10 mL

## 2024-10-14 MED ORDER — LIDOCAINE HCL (PF) 2 % IJ SOLN
INTRAMUSCULAR | Status: AC
  Filled 2024-10-14: qty 20

## 2024-10-14 NOTE — Progress Notes (Signed)
 PT tolerated thyroid  biopsy procedure x2 well today. Labs and afirma obtained and sent for pathology by Colette from ultrasound. PT ambulatory at discharge with no acute distress noted and verbalized understanding of discharge instructions.

## 2024-10-19 LAB — CYTOLOGY - NON PAP

## 2024-11-03 ENCOUNTER — Ambulatory Visit: Attending: Cardiology | Admitting: Cardiology

## 2024-11-03 ENCOUNTER — Encounter: Payer: Self-pay | Admitting: Cardiology

## 2024-11-03 VITALS — BP 158/72 | HR 61 | Ht 60.0 in | Wt 178.2 lb

## 2024-11-03 DIAGNOSIS — I5032 Chronic diastolic (congestive) heart failure: Secondary | ICD-10-CM | POA: Diagnosis not present

## 2024-11-03 DIAGNOSIS — I10 Essential (primary) hypertension: Secondary | ICD-10-CM

## 2024-11-03 DIAGNOSIS — E782 Mixed hyperlipidemia: Secondary | ICD-10-CM | POA: Diagnosis not present

## 2024-11-03 NOTE — Progress Notes (Signed)
 "     Clinical Summary Bethany Ray is a 79 y.o.female  1.Chronic HFpEF -new diagnosis during 05/2024 admission - noted leg edema.CXR and CT suggestive of pulm edema.  BNP 45 - 05/2024 echo: LVEF 70-75%, no MWAs, grade I dd, normal RV function, mod pulm HTN   - edema imporved with increasing lasix  to 40mg  daily. Complained of frequent urination, was changed to 40mg  alternating days with 20mg  - reports swelling doing ok.  - no SOB/DOE - SGLT2i was too expensive, had been on jardiance.    2. Chest pain - long history -05/2024 CT PE was negtive for PE - admit 05/2024 with chest pain, thought noncardiac. She also complated of cough, had elevated WBC. Treated with azithro. Also onset of symptoms after lifiting heavy box, area tender to palpation.  - chest pain has improved, still some positional pains at times.   - admit 09/2024 with pleuritic chest pain, worst with deep breathing, tender to palpation - CT PE negative  Troponins are minimally elevated and flat.  - 09/2024 echo :LVEF 55-60%, no WMAs, normal diastolic function - rare atypical symptoms since discharge.    3. Rheumatoid arthritis - no contraindication for humira from cardiac standpoint   4.HLD - 12/2023 TC 181 TG 143 HDL 62 LDL 94  5. HTN - compliant with meds. Past Medical History:  Diagnosis Date   Arthritis    Breast cancer (HCC) 09/14/2013   Cancer (HCC) approx 2006   rt breast/lumpectomy/chemo/rad tx   Diabetes mellitus    Diastolic dysfunction    High cholesterol    Hypertension    Pulmonary edema    Shingles      Allergies[1]   Current Outpatient Medications  Medication Sig Dispense Refill   acetaminophen  (TYLENOL ) 500 MG tablet Take 500 mg by mouth every 8 (eight) hours as needed for moderate pain.     amLODipine  (NORVASC ) 10 MG tablet Take 10 mg by mouth daily.     aspirin  EC 81 MG tablet Take 81 mg by mouth daily.     cyanocobalamin  1000 MCG tablet Take 1 tablet (1,000 mcg total) by mouth daily.  30 tablet 0   diclofenac  Sodium (VOLTAREN ) 1 % GEL Apply 2 g topically 4 (four) times daily. 20 g 0   folic acid  (FOLVITE ) 1 MG tablet Take 1 mg by mouth daily.     furosemide  (LASIX ) 40 MG tablet Take 40 mg Daily  alternating with 20 mg Daily 90 tablet 3   glimepiride  (AMARYL ) 1 MG tablet Take 1 mg by mouth daily with breakfast.      hydrALAZINE  (APRESOLINE ) 25 MG tablet Take 1 tablet (25 mg total) by mouth 2 (two) times daily. 60 tablet 0   lidocaine  (LIDODERM ) 5 % Place 1 patch onto the skin daily. Remove & Discard patch within 12 hours or as directed by MD 30 patch 0   metFORMIN  (GLUCOPHAGE -XR) 500 MG 24 hr tablet Take 500 mg by mouth 2 (two) times daily.      methotrexate (RHEUMATREX) 2.5 MG tablet Take 7.5 mg by mouth 2 (two) times a week. Thursdays and Fridays     olmesartan  (BENICAR ) 40 MG tablet Take 1 tablet (40 mg total) by mouth daily. 30 tablet 0   pantoprazole  (PROTONIX ) 40 MG tablet Take 1 tablet (40 mg total) by mouth daily. 30 tablet 0   pravastatin  (PRAVACHOL ) 20 MG tablet Take 20 mg by mouth every evening.      spironolactone  (ALDACTONE ) 25 MG tablet Take 25  mg by mouth daily.     traMADol  (ULTRAM ) 50 MG tablet Take 0.5 tablets (25 mg total) by mouth every 6 (six) hours as needed for severe pain (pain score 7-10). 30 tablet 0   Turmeric Curcumin 500 MG CAPS Take 500 mg by mouth daily.     valACYclovir (VALTREX) 1000 MG tablet Take 1,000 mg by mouth daily.     fluticasone  (FLONASE ) 50 MCG/ACT nasal spray Place 2 sprays into both nostrils daily. (Patient taking differently: Place 2 sprays into both nostrils daily as needed.) 16 g 0   Vitamin D , Ergocalciferol , (DRISDOL ) 1.25 MG (50000 UNIT) CAPS capsule Take 1 capsule (50,000 Units total) by mouth every 7 (seven) days. (Patient not taking: Reported on 11/03/2024) 5 capsule 0   No current facility-administered medications for this visit.     Past Surgical History:  Procedure Laterality Date   ABDOMINAL HYSTERECTOMY     BREAST  SURGERY     CHOLECYSTECTOMY     COLONOSCOPY WITH PROPOFOL  N/A 04/03/2022   Procedure: COLONOSCOPY WITH PROPOFOL ;  Surgeon: Eartha Angelia Sieving, MD;  Location: [facility];  Service: Gastroenterology;  Laterality: N/A;  1:00, per Anette Caldron pt knows to arrive at 9:00   IR RADIOLOGY PERIPHERAL GUIDED IV START  11/18/2019   IR US  GUIDE VASC ACCESS RIGHT  11/18/2019   POLYPECTOMY  04/03/2022   Procedure: POLYPECTOMY;  Surgeon: Eartha Angelia Sieving, MD;  Location: [facility];  Service: Gastroenterology;;     Allergies[2]    No family history on file.   Social History Bethany Ray reports that she has never smoked. She has never used smokeless tobacco. Bethany Ray reports no history of alcohol use.    Physical Examination Today's Vitals   11/03/24 1308 11/03/24 1320 11/03/24 1345  BP: (!) 170/74 (!) 167/74 (!) 158/72  Pulse: 61    SpO2: 99%    Weight: 178 lb 3.2 oz (80.8 kg)    Height: 5' (1.524 m)     Body mass index is 34.8 kg/m.  Gen: resting comfortably, no acute distress HEENT: no scleral icterus, pupils equal round and reactive, no palptable cervical adenopathy,  CV: RRR, no mrg, no jvd Resp: Clear to auscultation bilaterally GI: abdomen is soft, non-tender, non-distended, normal bowel sounds, no hepatosplenomegaly MSK: extremities are warm, no edema.  Skin: warm, no rash Neuro:  no focal deficits Psych: appropriate affect   Diagnostic Studies Echocardiogram 07/22/2019:    1. Left ventricular ejection fraction, by visual estimation, is 70 to 75%. The left ventricle has hyperdynamic function. Normal left ventricular size. There is mildly increased left ventricular hypertrophy.  2. Global right ventricle has normal systolic function.The right ventricular size is normal. No increase in right ventricular wall thickness.  3. Left atrial size was normal.  4. Right atrial size was normal.  5. Mild aortic valve annular calcification.  6. Mild mitral annular  calcification.  7. The mitral valve is grossly normal. Trace mitral valve regurgitation.  8. The tricuspid valve is grossly normal. Tricuspid valve regurgitation is trivial.  9. The aortic valve is tricuspid Aortic valve regurgitation was not visualized by color flow Doppler. 10. The pulmonic valve was grossly normal. Pulmonic valve regurgitation is trivial by color flow Doppler. 11. Mildly elevated pulmonary artery systolic pressure. 12. The tricuspid regurgitant velocity is 2.93 m/s, and with an assumed right atrial pressure of 3 mmHg, the estimated right ventricular systolic pressure is mildly elevated at 37.3 mmHg. 13. The inferior vena cava is normal in  size with greater than 50% respiratory variability, suggesting right atrial pressure of 3 mmHg.     Coronary CT angiography 11/18/2019:   Pulmonary veins drain normally to the left atrium. No LA appendage thrombus noted.   Calcium Score: 0 Agatston units.   Coronary Arteries: Right dominant with no anomalies   LM: No plaque or stenosis.   LAD system:  No plaque or stenosis.   Circumflex system: No plaque or stenosis.   RCA system: No plaque or stenosis.   IMPRESSION: 1. Coronary artery calcium score 0 Agatston units, suggesting low risk for future cardiac events.   2.  No significant coronary disease noted.      Assessment and Plan  1.Chronic HFpEF - euvolemic, conitnue current meds - SGLT2i was too expensive   2. HTN - bp elevated, had been well controlled last visit - submit bp log 1 week, room to titrate hydralazine   3. HLD - labs followed by pcp - lipids at goal, continue current meds  4. RA - ok to restart humira from cardiac standpoint if indicated from rheum standpoint        Dorn PHEBE Ross, M.D.     [1]  Allergies Allergen Reactions   Penicillins Nausea And Vomiting    Childhood allergy  Other Reaction(s): Not available  Product containing penicillin and antibiotic (product)  [2]   Allergies Allergen Reactions   Penicillins Nausea And Vomiting    Childhood allergy  Other Reaction(s): Not available  Product containing penicillin and antibiotic (product)   "

## 2024-11-03 NOTE — Patient Instructions (Signed)
 Medication Instructions:  Your physician recommends that you continue on your current medications as directed. Please refer to the Current Medication list given to you today.  *If you need a refill on your cardiac medications before your next appointment, please call your pharmacy*  Lab Work: None If you have labs (blood work) drawn today and your tests are completely normal, you will receive your results only by: MyChart Message (if you have MyChart) OR A paper copy in the mail If you have any lab test that is abnormal or we need to change your treatment, we will call you to review the results.  Testing/Procedures: None  Follow-Up: At [facility], you and your health needs are our priority.  As part of our continuing mission to provide you with exceptional heart care, our providers are all part of one team.  This team includes your primary Cardiologist (physician) and Advanced Practice Providers or APPs (Physician Assistants and Nurse Practitioners) who all work together to provide you with the care you need, when you need it.  Your next appointment:   6 month(s)  Provider:   You may see Alvan Carrier, MD or one of the following Advanced Practice Providers on your designated Care Team:   Laymon Qua, PA-C  [place], [place] Olivia Pavy, [place]     We recommend signing up for the patient portal called MyChart.  Sign up information is provided on this After Visit Summary.  MyChart is used to connect with patients for Virtual Visits (Telemedicine).  Patients are able to view lab/test results, encounter notes, upcoming appointments, etc.  Non-urgent messages can be sent to your provider as well.   To learn more about what you can do with MyChart, go to [email or web address].   Other Instructions Your physician has requested that you regularly monitor and record your blood pressure readings at home. Please use the same machine at the same time of day to check your  readings and record them to bring to our office in 1 week.

## 2024-12-09 ENCOUNTER — Ambulatory Visit: Admitting: Podiatry
